# Patient Record
Sex: Female | Born: 1935 | Race: White | Hispanic: No | Smoking: Never smoker
Health system: Southern US, Community
[De-identification: ages and names within clinical notes are randomized; demographics above are authoritative.]

## PROBLEM LIST (undated history)

## (undated) DIAGNOSIS — I350 Nonrheumatic aortic (valve) stenosis: Secondary | ICD-10-CM

## (undated) DIAGNOSIS — E119 Type 2 diabetes mellitus without complications: Secondary | ICD-10-CM

## (undated) DIAGNOSIS — M109 Gout, unspecified: Secondary | ICD-10-CM

## (undated) DIAGNOSIS — E039 Hypothyroidism, unspecified: Secondary | ICD-10-CM

## (undated) DIAGNOSIS — D509 Iron deficiency anemia, unspecified: Secondary | ICD-10-CM

## (undated) DIAGNOSIS — G4730 Sleep apnea, unspecified: Secondary | ICD-10-CM

## (undated) DIAGNOSIS — N183 Chronic kidney disease, stage 3 unspecified: Secondary | ICD-10-CM

## (undated) DIAGNOSIS — F329 Major depressive disorder, single episode, unspecified: Secondary | ICD-10-CM

## (undated) DIAGNOSIS — E7800 Pure hypercholesterolemia, unspecified: Secondary | ICD-10-CM

## (undated) DIAGNOSIS — I10 Essential (primary) hypertension: Secondary | ICD-10-CM

## (undated) DIAGNOSIS — R55 Syncope and collapse: Secondary | ICD-10-CM

## (undated) DIAGNOSIS — F32A Depression, unspecified: Secondary | ICD-10-CM

## (undated) HISTORY — DX: Type 2 diabetes mellitus without complications: E11.9

## (undated) HISTORY — PX: THORACENTESIS: SHX235

## (undated) HISTORY — DX: Essential (primary) hypertension: I10

## (undated) HISTORY — DX: Iron deficiency anemia, unspecified: D50.9

## (undated) HISTORY — DX: Sleep apnea, unspecified: G47.30

## (undated) HISTORY — DX: Pure hypercholesterolemia, unspecified: E78.00

## (undated) HISTORY — PX: TONSILLECTOMY: SUR1361

---

## 1982-06-15 HISTORY — PX: BACK SURGERY: SHX140

## 1992-06-15 HISTORY — PX: KNEE SURGERY: SHX244

## 1995-06-16 HISTORY — PX: CATARACT EXTRACTION, BILATERAL: SHX1313

## 1997-06-15 HISTORY — PX: GASTRIC BYPASS: SHX52

## 1999-06-16 HISTORY — PX: CHOLECYSTECTOMY: SHX55

## 2014-05-14 LAB — PULMONARY FUNCTION TEST

## 2015-09-09 ENCOUNTER — Encounter: Payer: Self-pay | Admitting: Cardiology

## 2016-03-16 LAB — PULMONARY FUNCTION TEST

## 2016-05-12 ENCOUNTER — Encounter: Payer: Self-pay | Admitting: *Deleted

## 2016-05-13 ENCOUNTER — Ambulatory Visit (INDEPENDENT_AMBULATORY_CARE_PROVIDER_SITE_OTHER): Payer: Medicare Other | Admitting: Pulmonary Disease

## 2016-05-13 ENCOUNTER — Encounter: Payer: Self-pay | Admitting: Pulmonary Disease

## 2016-05-13 DIAGNOSIS — R0600 Dyspnea, unspecified: Secondary | ICD-10-CM | POA: Diagnosis not present

## 2016-05-13 DIAGNOSIS — R0689 Other abnormalities of breathing: Secondary | ICD-10-CM

## 2016-05-13 NOTE — Progress Notes (Addendum)
Madison Riggs    [id number]    04-Apr-1928  Primary Care Physician:HEPLER,MARK, PA-C  Referring Physician: Corine Shelter, PA-C [place], [place] [address]  Chief complaint:  Consult to establish care with new pulmonary practice.  HPI: Madison Riggs is an 82 year old with past medical history of hypertension, hypercholesterolemia, diabetes, anemia. She moved here from [place] after her husband passed away to be closer to her son. She was evaluated by pulmonologist Dr. Rudolpho Sevin at [facility] for pulmonary fibrosis. All data from [facility] reviewed. She had a high-resolution CT which did not show any pulmonary fibrosis however however she has small pleural effusion and a right-sided opacity consistent with atelectasis. She has history of right-sided pneumonia with located effusion status post thoracentesis around 2015. She is also noted to have desats at night after overnight oximetry and is on 2 L supplemental oxygen. Ambulatory oximetry in previous office on 03/16/16 showed transient desats to 85.   She has chronic dyspnea on exertion. She denies any cough, sputum production, wheezing. Her chief complaint today is swelling and edema around her abdomen and bilateral lower extremities. She has seen a cardiologist and nephrologist previously and is trying to establish that care here. She has seen Dr.[place] and has stage III CKD. She has been started on diuretics with improvement in swelling. She is waiting to see a cardiologist as well. She is a nonsmoker and used to work as a Primary school teacher at [employer]. She does not report any significant exposures at work or at home.   No outpatient encounter prescriptions on file as of 05/13/2016.   No facility-administered encounter medications on file as of 05/13/2016.     Allergies as of 05/13/2016  . (No Known Allergies)    Past Medical History:  Diagnosis Date  . DM (diabetes mellitus), type 2 ([place])   . HTN  (hypertension)   . Hypercholesterolemia   . Iron deficiency anemia     Past Surgical History:  Procedure Laterality Date  . BACK SURGERY  1984  . CHOLECYSTECTOMY  2001  . GASTRIC BYPASS  1999  . KNEE SURGERY Left 1994  . TONSILLECTOMY      Family History  Problem Relation Age of Onset  . Breast cancer Mother   . Colon cancer Father   . Prostate cancer Father   . Emphysema Father     Social History   Social History  . Marital status: Married    Spouse name: N/A  . Number of children: N/A  . Years of education: N/A   Occupational History  . Not on file.   Social History Main Topics  . Smoking status: Never Smoker  . Smokeless tobacco: Never Used  . Alcohol use No  . Drug use: No  . Sexual activity: Not on file   Other Topics Concern  . Not on file   Social History Narrative   Widowed   Lives with son and daughter-in-law   Madison Riggs here from [place] 03/25/2016   2 sons   Occupation: retired, was a Museum/gallery conservator with [employer]     Review of systems: Review of Systems  Constitutional: Negative for fever and chills.  HENT: Negative.   Eyes: Negative for blurred vision.  Respiratory: as per HPI  Cardiovascular: Negative for chest pain and palpitations.  Gastrointestinal: Negative for vomiting, diarrhea, blood per rectum. Genitourinary: Negative for dysuria, urgency, frequency and hematuria.  Musculoskeletal: Negative for myalgias, back pain  and joint pain.  Skin: Negative for itching and rash.  Neurological: Negative for dizziness, tremors, focal weakness, seizures and loss of consciousness.  Endo/Heme/Allergies: Negative for environmental allergies.  Psychiatric/Behavioral: Negative for depression, suicidal ideas and hallucinations.  All other systems reviewed and are negative.   Physical Exam: Blood pressure 116/60, pulse 78, height 5\' 2"  (1.575 m), weight 155 lb 3.2 oz (70.4 kg), SpO2 97 %. Gen:      No acute distress HEENT:  EOMI, sclera  anicteric Neck:     No masses; no thyromegaly Lungs:    Clear to auscultation bilaterally; normal respiratory effort CV:         Regular rate and rhythm; no murmurs Abd:      + bowel sounds; soft, non-tender; no palpable masses, no distension Ext:    No edema; adequate peripheral perfusion Skin:      Warm and dry; no rash Neuro: alert and oriented x 3 Psych: normal mood and affect  Data Reviewed: DATA collected from previous pulmonologist Chest x-ray 01/12/14- Right basilar airspace disease and moderate right effusion.  CT scan 02/06/14-moderate sized right pleural effusion, possibly located with associated compressive atelectasis of the right middle lobe and the right lower lobe  High resolution CT 03/19/16-small left pleural effusion. This is focal opacity and possibly mid-pleural thickening at the right posterior lung base with the focal opacity possibly reflecting chronic atelectasis or developing airspace disease. A follow-up CT chest in 4-6 months time recommended to ensure stability or resolution of the findings at the right posterior base. No additional high-resolution imaging to suggest air-trapping. There is no significant evidence to suggest bronchiectasis, subpleural bands, honeycombing septal thickening.   Chest x-ray 03/13/16- right pleural effusion unchanged. New blunting of left costophrenic angle consistent with pleural effusion. Cephalization of blood flow with indistinctiveness of interstitium. No mass or consolidation otherwise. Mild thoracic spondylosis. Findings consistent with CHF.  PFTs 05/14/14 FVC 1.79 (81%)  FEV1 1.52 [93%] F/F 85 TLC 62% DLCO 74%, DLCO/VA 160%. Mild-moderate restriction.  PFTs 03/16/16 FVC 1.51 (71%)  FEV1 1.17 (75%] F/F 77  DLCO 63, DLCO/VA 135% Mild-moderate restriction.   Echocardiogram 11/15/15 Normal LV size and function. EF-70% Normal RV size and function Mildly dilated LA Moderate aortic stenosis, mild mitral regurgitation, mild  tricuspid regurgitation. Normal PA pressures.  Assessment:  Consult for evaluation of pulmonary fibrosis. Review of her records including a high res CT last month is not suggestive of pulmonary fibrosis. She does have mild restriction may be due to body habitus. I think her main issue is fluid overload from CKD and possible heart issues. I'll contact the PCPs office to see if they have an echocardiogram on record.  She has a right lower lobe opacity which may be scarring from her previous pneumonia, loculated effusion. I'll reevaluate this with a repeat CT in 6 months. She'll continue on supplemental oxygen at night. She'll need an overnight oximetry and ambulatory oximetry at the time of next visit. Consider sleep study if unable to get off O2  Plan/Recommendations: - CT scan without contrast in 6 months  Marshell Garfinkel MD [facility] Pager [phone] 05/13/2016, 4:16 PM  CC: Corine Shelter, PA-C

## 2016-05-13 NOTE — Patient Instructions (Signed)
I do not see any evidence of pulmonary fibrosis in the records obtained from a previous pulmonologist. There is a small opacity on the right side which may be scarring from her previous pneumonia. We'll follow this up with a CT scan without contrast in 6 months. Follow-up after CT scan.

## 2016-06-02 DIAGNOSIS — R0689 Other abnormalities of breathing: Principal | ICD-10-CM

## 2016-06-02 DIAGNOSIS — R0600 Dyspnea, unspecified: Secondary | ICD-10-CM | POA: Insufficient documentation

## 2016-09-30 ENCOUNTER — Other Ambulatory Visit: Payer: Self-pay | Admitting: Gastroenterology

## 2016-09-30 DIAGNOSIS — R1310 Dysphagia, unspecified: Secondary | ICD-10-CM

## 2016-10-06 ENCOUNTER — Telehealth: Payer: Self-pay | Admitting: Hematology

## 2016-10-06 ENCOUNTER — Encounter: Payer: Self-pay | Admitting: Hematology

## 2016-10-06 NOTE — Telephone Encounter (Signed)
Received a call from [facility] from [employer] to schedule the pt an appt. Appt has been scheduled for the pt to see Dr. Irene Limbo on 5/21 at 11am. Tammi Klippel will notify the pt. Letter mailed to the pt.

## 2016-10-07 ENCOUNTER — Ambulatory Visit
Admission: RE | Admit: 2016-10-07 | Discharge: 2016-10-07 | Disposition: A | Discharge status: Home / Self Care | Payer: Medicare Other | Source: Ambulatory Visit | Attending: Gastroenterology | Admitting: Gastroenterology

## 2016-10-07 DIAGNOSIS — R1310 Dysphagia, unspecified: Secondary | ICD-10-CM

## 2016-10-11 ENCOUNTER — Emergency Department (HOSPITAL_BASED_OUTPATIENT_CLINIC_OR_DEPARTMENT_OTHER)
Admission: EM | Admit: 2016-10-11 | Discharge: 2016-10-11 | Disposition: A | Discharge status: Home / Self Care | Payer: Medicare Other | Attending: Emergency Medicine | Admitting: Emergency Medicine

## 2016-10-11 ENCOUNTER — Encounter (HOSPITAL_BASED_OUTPATIENT_CLINIC_OR_DEPARTMENT_OTHER): Payer: Self-pay | Admitting: Emergency Medicine

## 2016-10-11 ENCOUNTER — Emergency Department (HOSPITAL_BASED_OUTPATIENT_CLINIC_OR_DEPARTMENT_OTHER): Payer: Medicare Other

## 2016-10-11 DIAGNOSIS — S3992XA Unspecified injury of lower back, initial encounter: Secondary | ICD-10-CM | POA: Diagnosis present

## 2016-10-11 DIAGNOSIS — I10 Essential (primary) hypertension: Secondary | ICD-10-CM | POA: Diagnosis not present

## 2016-10-11 DIAGNOSIS — S300XXA Contusion of lower back and pelvis, initial encounter: Secondary | ICD-10-CM | POA: Insufficient documentation

## 2016-10-11 DIAGNOSIS — Y999 Unspecified external cause status: Secondary | ICD-10-CM | POA: Insufficient documentation

## 2016-10-11 DIAGNOSIS — E119 Type 2 diabetes mellitus without complications: Secondary | ICD-10-CM | POA: Diagnosis not present

## 2016-10-11 DIAGNOSIS — W010XXA Fall on same level from slipping, tripping and stumbling without subsequent striking against object, initial encounter: Secondary | ICD-10-CM | POA: Insufficient documentation

## 2016-10-11 DIAGNOSIS — Y92009 Unspecified place in unspecified non-institutional (private) residence as the place of occurrence of the external cause: Secondary | ICD-10-CM | POA: Insufficient documentation

## 2016-10-11 DIAGNOSIS — Z79899 Other long term (current) drug therapy: Secondary | ICD-10-CM | POA: Insufficient documentation

## 2016-10-11 DIAGNOSIS — Y939 Activity, unspecified: Secondary | ICD-10-CM | POA: Insufficient documentation

## 2016-10-11 MED ORDER — HYDROCODONE-ACETAMINOPHEN 5-325 MG PO TABS: 1.0000 | ORAL_TABLET | Freq: Four times a day (QID) | ORAL | 0 refills | Status: DC | PRN

## 2016-10-11 MED ORDER — HYDROCODONE-ACETAMINOPHEN 5-325 MG PO TABS
1.0000 | ORAL_TABLET | Freq: Once | ORAL | Status: AC
  Administered 2016-10-11: 1 via ORAL
  Filled 2016-10-11: qty 1

## 2016-10-11 NOTE — ED Triage Notes (Signed)
Reports fall at home-injuring right hip.  Reports she was seen at urgent care and xray was performed-negative results.  States she was referred to ER for CT.  Denies head injury, LOC.

## 2016-10-11 NOTE — ED Notes (Signed)
Pt teaching provided on medications that may cause drowsiness. Pt instructed not to drive or operate heavy machinery while taking the prescribed medication. Pt verbalized understanding.  Also discussed the risks of narcotics in the elderly.

## 2016-10-11 NOTE — ED Provider Notes (Signed)
[facility] DEPT [facility] Provider Note   CSN: [id number] Arrival date & time: 10/11/16  1725  By signing my name below, I, Neta Mends, attest that this documentation has been prepared under the direction and in the presence of Blanchie Dessert, MD . Electronically Signed: Neta Mends, ED Scribe. 10/11/2016. 6:18 PM.   History   Chief Complaint Chief Complaint  Patient presents with  . Fall   The history is provided by the patient. No language interpreter was used.   HPI Comments:  Madison Riggs is a 83 y.o. female who presents to the Emergency Department s/p fall that occurred PTA. Per family member, pt tripped on tile, and fell landing on her right hip. Pt complains of associated right hip pain at this time. She states that the pain is worse with walking. She is ambulatory with a walker. Pt was seen at urgent care today and the x-rays taken there were negative. She denies any head injury/LOC. Pt takes anticoagulates regularly. No alleviating factors noted. Pt denies other associated symptoms.   Past Medical History:  Diagnosis Date  . DM (diabetes mellitus), type 2 ([place])   . HTN (hypertension)   . Hypercholesterolemia   . Iron deficiency anemia     Patient Active Problem List   Diagnosis Date Noted  . Dyspnea and respiratory abnormalities 06/02/2016    Past Surgical History:  Procedure Laterality Date  . BACK SURGERY  1984  . CHOLECYSTECTOMY  2001  . GASTRIC BYPASS  1999  . KNEE SURGERY Left 1994  . TONSILLECTOMY      OB History    No data available       Home Medications    Prior to Admission medications   Medication Sig Start Date End Date Taking? Authorizing Provider  allopurinol (ZYLOPRIM) 100 MG tablet Take 100 mg by mouth daily.   Yes Historical Provider, MD  busPIRone (BUSPAR) 10 MG tablet Take 10 mg by mouth 3 (three) times daily.   Yes Historical Provider, MD  calcium-vitamin D (OSCAL WITH D) 500-200 MG-UNIT tablet Take 1 tablet by  mouth.   Yes Historical Provider, MD  cetirizine (ZYRTEC) 10 MG tablet Take 10 mg by mouth daily.   Yes Historical Provider, MD  Cinnamon 500 MG TABS Take by mouth.   Yes Historical Provider, MD  docusate sodium (COLACE) 100 MG capsule Take 100 mg by mouth 2 (two) times daily.   Yes Historical Provider, MD  folic acid (FOLVITE) 1 MG tablet Take 1 mg by mouth daily.   Yes Historical Provider, MD  glucosamine-chondroitin 500-400 MG tablet Take 1 tablet by mouth 3 (three) times daily.   Yes Historical Provider, MD  Lactobacillus (ACIDOPHILUS) 100 MG CAPS Take by mouth.   Yes Historical Provider, MD  levothyroxine (SYNTHROID, LEVOTHROID) 75 MCG tablet Take 75 mcg by mouth daily before breakfast.   Yes Historical Provider, MD  magnesium gluconate (MAGONATE) 500 MG tablet Take 500 mg by mouth 2 (two) times daily.   Yes Historical Provider, MD  polyethylene glycol (MIRALAX / GLYCOLAX) packet Take 17 g by mouth daily.   Yes Historical Provider, MD  simvastatin (ZOCOR) 20 MG tablet Take 20 mg by mouth daily.   Yes Historical Provider, MD  torsemide (DEMADEX) 20 MG tablet Take 20 mg by mouth daily.   Yes Historical Provider, MD  traZODone (DESYREL) 50 MG tablet Take 50 mg by mouth at bedtime.   Yes Historical Provider, MD  Turmeric 500 MG TABS Take by mouth.  Yes Historical Provider, MD  venlafaxine (EFFEXOR) 75 MG tablet Take 75 mg by mouth 2 (two) times daily.   Yes Historical Provider, MD    Family History Family History  Problem Relation Age of Onset  . Breast cancer Mother   . Colon cancer Father   . Prostate cancer Father   . Emphysema Father     Social History Social History  Substance Use Topics  . Smoking status: Never Smoker  . Smokeless tobacco: Never Used  . Alcohol use No     Allergies   Patient has no known allergies.   Review of Systems Review of Systems All systems reviewed and are negative for acute change except as noted in the HPI.   Physical Exam Updated Vital  Signs BP (!) 106/52   Pulse 88   Temp 98.1 F (36.7 C) (Oral)   Resp 16   Ht 5\' 2"  (1.575 m)   Wt 148 lb (67.1 kg)   SpO2 99%   BMI 27.07 kg/m   Physical Exam  Constitutional: She appears well-developed and well-nourished. No distress.  HENT:  Head: Normocephalic and atraumatic.  Eyes: Conjunctivae are normal.  Cardiovascular: Normal rate and regular rhythm.   Pulmonary/Chest: Effort normal.  Abdominal: Soft. She exhibits no distension. There is no tenderness.  Musculoskeletal:  Able to independently range bilateral lower extremities. No deformity or rotation. Sensation intact. Significant TTP of posterior hip and sacral notch. No lumbar tenderness.   Neurological: She is alert.  Skin: Skin is warm and dry.  Psychiatric: She has a normal mood and affect.  Nursing note and vitals reviewed.    ED Treatments / Results  DIAGNOSTIC STUDIES:  Oxygen Saturation is 99% on RA, normal by my interpretation.    COORDINATION OF CARE:  6:11 PM Discussed treatment plan with pt at bedside and pt agreed to plan.   Labs (all labs ordered are listed, but only abnormal results are displayed) Labs Reviewed - No data to display  EKG  EKG Interpretation None       Radiology Ct Hip Right Wo Contrast  Result Date: 10/11/2016 CLINICAL DATA:  Recent fall with right hip pain, initial encounter EXAM: CT OF THE RIGHT HIP WITHOUT CONTRAST TECHNIQUE: Multidetector CT imaging of the right hip was performed according to the standard protocol. Multiplanar CT image reconstructions were also generated. COMPARISON:  None. FINDINGS: Bones/Joint/Cartilage Mild degenerative changes of the lumbosacral junction as well as the sacroiliac joints are seen. There are mild degenerative changes of the right hip joint as well. No acute fracture or dislocation is seen. Ligaments Suboptimally assessed by CT. Muscles and Tendons A right gluteal hematoma is identified with a hematocrit level within. It measures  approximately 5 cm in greatest dimension. Soft tissues Changes of anasarca are noted. Ascites is noted within the abdomen incompletely evaluated by this exam. IMPRESSION: No acute hip fracture is identified. Degenerative changes are noted as described. Right gluteal hematoma. Ascites and changes of anasarca. Electronically Signed   By: Inez Catalina M.D.   On: 10/11/2016 19:07    Procedures Procedures (including critical care time)  Medications Ordered in ED Medications - No data to display   Initial Impression / Assessment and Plan / ED Course  I have reviewed the triage vital signs and the nursing notes.  Pertinent labs & imaging results that were available during my care of the patient were reviewed by me and considered in my medical decision making (see chart for details).  Patient is an elderly female presenting today after mechanical fall at her home. She fell on her right hip. She has been able to ambulate after the fall but has pain in her right buttocks. She was seen at urgent care and images were negative however she had recently had a barium swallow and there was barium obstructing the view of some of the images. She was sent here for evaluation to ensure no occult fracture. Patient is neurovascularly intact and pain is localized to the right buttocks. No evidence of trauma. No lumbar tenderness. CT shows no acute hip fracture but she does have her right gluteal hematoma. This is exactly where patient is having pain. Patient's pain was significantly improved after 1 Vicodin. She has family present and findings were relayed to the patient and her family. She lives with family and they can ensure her safety. She was discharged home to use ice and pain control  Final Clinical Impressions(s) / ED Diagnoses   Final diagnoses:  Traumatic hematoma of buttock, initial encounter    New Prescriptions New Prescriptions   HYDROCODONE-ACETAMINOPHEN (NORCO/VICODIN) 5-325 MG TABLET    Take 1  tablet by mouth every 6 (six) hours as needed for severe pain.   I personally performed the services described in this documentation, which was scribed in my presence.  The recorded information has been reviewed and considered.     Blanchie Dessert, MD 10/11/16 1954

## 2016-10-11 NOTE — ED Notes (Signed)
ED Provider at bedside. 

## 2016-10-16 ENCOUNTER — Observation Stay (HOSPITAL_BASED_OUTPATIENT_CLINIC_OR_DEPARTMENT_OTHER)
Admission: EM | Admit: 2016-10-16 | Discharge: 2016-10-19 | Disposition: A | Discharge status: Home / Self Care | Payer: Medicare Other | Attending: Family Medicine | Admitting: Family Medicine

## 2016-10-16 ENCOUNTER — Observation Stay (HOSPITAL_COMMUNITY): Payer: Medicare Other

## 2016-10-16 ENCOUNTER — Encounter (HOSPITAL_BASED_OUTPATIENT_CLINIC_OR_DEPARTMENT_OTHER): Payer: Self-pay | Admitting: *Deleted

## 2016-10-16 DIAGNOSIS — E1122 Type 2 diabetes mellitus with diabetic chronic kidney disease: Secondary | ICD-10-CM | POA: Insufficient documentation

## 2016-10-16 DIAGNOSIS — W19XXXD Unspecified fall, subsequent encounter: Secondary | ICD-10-CM | POA: Diagnosis not present

## 2016-10-16 DIAGNOSIS — F329 Major depressive disorder, single episode, unspecified: Secondary | ICD-10-CM | POA: Insufficient documentation

## 2016-10-16 DIAGNOSIS — D649 Anemia, unspecified: Secondary | ICD-10-CM

## 2016-10-16 DIAGNOSIS — M8580 Other specified disorders of bone density and structure, unspecified site: Secondary | ICD-10-CM | POA: Diagnosis not present

## 2016-10-16 DIAGNOSIS — Z9842 Cataract extraction status, left eye: Secondary | ICD-10-CM | POA: Diagnosis not present

## 2016-10-16 DIAGNOSIS — E1129 Type 2 diabetes mellitus with other diabetic kidney complication: Secondary | ICD-10-CM | POA: Diagnosis present

## 2016-10-16 DIAGNOSIS — I700 Atherosclerosis of aorta: Secondary | ICD-10-CM | POA: Diagnosis not present

## 2016-10-16 DIAGNOSIS — D508 Other iron deficiency anemias: Secondary | ICD-10-CM

## 2016-10-16 DIAGNOSIS — S300XXS Contusion of lower back and pelvis, sequela: Secondary | ICD-10-CM

## 2016-10-16 DIAGNOSIS — I2510 Atherosclerotic heart disease of native coronary artery without angina pectoris: Secondary | ICD-10-CM | POA: Diagnosis not present

## 2016-10-16 DIAGNOSIS — S300XXA Contusion of lower back and pelvis, initial encounter: Secondary | ICD-10-CM | POA: Insufficient documentation

## 2016-10-16 DIAGNOSIS — Z800 Family history of malignant neoplasm of digestive organs: Secondary | ICD-10-CM | POA: Insufficient documentation

## 2016-10-16 DIAGNOSIS — K210 Gastro-esophageal reflux disease with esophagitis: Secondary | ICD-10-CM | POA: Diagnosis not present

## 2016-10-16 DIAGNOSIS — D509 Iron deficiency anemia, unspecified: Secondary | ICD-10-CM | POA: Diagnosis not present

## 2016-10-16 DIAGNOSIS — R1310 Dysphagia, unspecified: Secondary | ICD-10-CM | POA: Diagnosis not present

## 2016-10-16 DIAGNOSIS — D62 Acute posthemorrhagic anemia: Secondary | ICD-10-CM | POA: Diagnosis not present

## 2016-10-16 DIAGNOSIS — Z803 Family history of malignant neoplasm of breast: Secondary | ICD-10-CM | POA: Insufficient documentation

## 2016-10-16 DIAGNOSIS — K5900 Constipation, unspecified: Secondary | ICD-10-CM | POA: Insufficient documentation

## 2016-10-16 DIAGNOSIS — Z9884 Bariatric surgery status: Secondary | ICD-10-CM | POA: Insufficient documentation

## 2016-10-16 DIAGNOSIS — Z9049 Acquired absence of other specified parts of digestive tract: Secondary | ICD-10-CM | POA: Insufficient documentation

## 2016-10-16 DIAGNOSIS — E7800 Pure hypercholesterolemia, unspecified: Secondary | ICD-10-CM | POA: Insufficient documentation

## 2016-10-16 DIAGNOSIS — Z79899 Other long term (current) drug therapy: Secondary | ICD-10-CM | POA: Insufficient documentation

## 2016-10-16 DIAGNOSIS — R188 Other ascites: Secondary | ICD-10-CM | POA: Insufficient documentation

## 2016-10-16 DIAGNOSIS — Z980 Intestinal bypass and anastomosis status: Secondary | ICD-10-CM | POA: Diagnosis not present

## 2016-10-16 DIAGNOSIS — R0600 Dyspnea, unspecified: Secondary | ICD-10-CM

## 2016-10-16 DIAGNOSIS — Z836 Family history of other diseases of the respiratory system: Secondary | ICD-10-CM | POA: Insufficient documentation

## 2016-10-16 DIAGNOSIS — M109 Gout, unspecified: Secondary | ICD-10-CM | POA: Insufficient documentation

## 2016-10-16 DIAGNOSIS — K922 Gastrointestinal hemorrhage, unspecified: Secondary | ICD-10-CM | POA: Diagnosis present

## 2016-10-16 DIAGNOSIS — N179 Acute kidney failure, unspecified: Secondary | ICD-10-CM | POA: Diagnosis not present

## 2016-10-16 DIAGNOSIS — N183 Chronic kidney disease, stage 3 (moderate): Secondary | ICD-10-CM | POA: Insufficient documentation

## 2016-10-16 DIAGNOSIS — I10 Essential (primary) hypertension: Secondary | ICD-10-CM | POA: Diagnosis present

## 2016-10-16 DIAGNOSIS — Z9841 Cataract extraction status, right eye: Secondary | ICD-10-CM | POA: Insufficient documentation

## 2016-10-16 DIAGNOSIS — E785 Hyperlipidemia, unspecified: Secondary | ICD-10-CM | POA: Insufficient documentation

## 2016-10-16 DIAGNOSIS — I130 Hypertensive heart and chronic kidney disease with heart failure and stage 1 through stage 4 chronic kidney disease, or unspecified chronic kidney disease: Secondary | ICD-10-CM | POA: Insufficient documentation

## 2016-10-16 DIAGNOSIS — E039 Hypothyroidism, unspecified: Secondary | ICD-10-CM | POA: Diagnosis not present

## 2016-10-16 DIAGNOSIS — R0602 Shortness of breath: Secondary | ICD-10-CM

## 2016-10-16 DIAGNOSIS — I080 Rheumatic disorders of both mitral and aortic valves: Secondary | ICD-10-CM | POA: Insufficient documentation

## 2016-10-16 DIAGNOSIS — E118 Type 2 diabetes mellitus with unspecified complications: Secondary | ICD-10-CM

## 2016-10-16 DIAGNOSIS — Z8042 Family history of malignant neoplasm of prostate: Secondary | ICD-10-CM | POA: Insufficient documentation

## 2016-10-16 DIAGNOSIS — I5033 Acute on chronic diastolic (congestive) heart failure: Secondary | ICD-10-CM | POA: Insufficient documentation

## 2016-10-16 HISTORY — DX: Nonrheumatic aortic (valve) stenosis: I35.0

## 2016-10-16 HISTORY — DX: Gout, unspecified: M10.9

## 2016-10-16 HISTORY — DX: Chronic kidney disease, stage 3 (moderate): N18.3

## 2016-10-16 HISTORY — DX: Major depressive disorder, single episode, unspecified: F32.9

## 2016-10-16 HISTORY — DX: Chronic kidney disease, stage 3 unspecified: N18.30

## 2016-10-16 HISTORY — DX: Depression, unspecified: F32.A

## 2016-10-16 HISTORY — DX: Hypothyroidism, unspecified: E03.9

## 2016-10-16 LAB — CBC WITH DIFFERENTIAL/PLATELET
Basophils Absolute: 0 10*3/uL (ref 0.0–0.1)
Basophils Relative: 0 %
EOS PCT: 4 %
Eosinophils Absolute: 0.3 10*3/uL (ref 0.0–0.7)
HEMATOCRIT: 24.8 % — AB (ref 36.0–46.0)
HEMOGLOBIN: 7.9 g/dL — AB (ref 12.0–15.0)
LYMPHS PCT: 18 %
Lymphs Abs: 1.1 10*3/uL (ref 0.7–4.0)
MCH: 28.9 pg (ref 26.0–34.0)
MCHC: 31.9 g/dL (ref 30.0–36.0)
MCV: 90.8 fL (ref 78.0–100.0)
Monocytes Absolute: 0.6 10*3/uL (ref 0.1–1.0)
Monocytes Relative: 10 %
Neutro Abs: 4.1 10*3/uL (ref 1.7–7.7)
Neutrophils Relative %: 68 %
PLATELETS: 194 10*3/uL (ref 150–400)
RBC: 2.73 MIL/uL — AB (ref 3.87–5.11)
RDW: 16.4 % — ABNORMAL HIGH (ref 11.5–15.5)
WBC: 6.1 10*3/uL (ref 4.0–10.5)

## 2016-10-16 LAB — COMPREHENSIVE METABOLIC PANEL
ALK PHOS: 88 U/L (ref 38–126)
ALT: 18 U/L (ref 14–54)
AST: 35 U/L (ref 15–41)
Albumin: 3.5 g/dL (ref 3.5–5.0)
Anion gap: 11 (ref 5–15)
BUN: 51 mg/dL — AB (ref 6–20)
CALCIUM: 8.5 mg/dL — AB (ref 8.9–10.3)
CHLORIDE: 102 mmol/L (ref 101–111)
CO2: 26 mmol/L (ref 22–32)
CREATININE: 1.62 mg/dL — AB (ref 0.44–1.00)
GFR calc Af Amer: 32 mL/min — ABNORMAL LOW (ref >60)
GFR, EST NON AFRICAN AMERICAN: 28 mL/min — AB (ref >60)
Glucose, Bld: 130 mg/dL — ABNORMAL HIGH (ref 65–99)
Potassium: 4.1 mmol/L (ref 3.5–5.1)
Sodium: 139 mmol/L (ref 135–145)
Total Bilirubin: 0.8 mg/dL (ref 0.3–1.2)
Total Protein: 7 g/dL (ref 6.5–8.1)

## 2016-10-16 LAB — OCCULT BLOOD X 1 CARD TO LAB, STOOL: Fecal Occult Bld: POSITIVE — AB

## 2016-10-16 MED ORDER — SODIUM CHLORIDE 0.9% FLUSH: 3.0000 mL | INTRAVENOUS | Status: DC | PRN

## 2016-10-16 MED ORDER — LORATADINE 10 MG PO TABS
10.0000 mg | ORAL_TABLET | Freq: Every day | ORAL | Status: DC
  Administered 2016-10-17 – 2016-10-19 (×3): 10 mg via ORAL
  Filled 2016-10-16 (×3): qty 1

## 2016-10-16 MED ORDER — SIMVASTATIN 20 MG PO TABS
20.0000 mg | ORAL_TABLET | Freq: Every day | ORAL | Status: DC
  Administered 2016-10-16 – 2016-10-18 (×3): 20 mg via ORAL
  Filled 2016-10-16 (×3): qty 1

## 2016-10-16 MED ORDER — ALLOPURINOL 100 MG PO TABS
100.0000 mg | ORAL_TABLET | Freq: Every day | ORAL | Status: DC
  Administered 2016-10-17 – 2016-10-19 (×3): 100 mg via ORAL
  Filled 2016-10-16 (×3): qty 1

## 2016-10-16 MED ORDER — LEVOTHYROXINE SODIUM 88 MCG PO TABS
88.0000 ug | ORAL_TABLET | Freq: Every day | ORAL | Status: DC
  Administered 2016-10-17 – 2016-10-19 (×3): 88 ug via ORAL
  Filled 2016-10-16 (×3): qty 1

## 2016-10-16 MED ORDER — VENLAFAXINE HCL 75 MG PO TABS
75.0000 mg | ORAL_TABLET | Freq: Two times a day (BID) | ORAL | Status: DC
  Administered 2016-10-16 – 2016-10-19 (×6): 75 mg via ORAL
  Filled 2016-10-16 (×6): qty 1

## 2016-10-16 MED ORDER — ACETAMINOPHEN 650 MG RE SUPP: 650.0000 mg | Freq: Four times a day (QID) | RECTAL | Status: DC | PRN

## 2016-10-16 MED ORDER — SODIUM CHLORIDE 0.9 % IV SOLN
INTRAVENOUS | Status: DC
  Administered 2016-10-16: 22:00:00 via INTRAVENOUS

## 2016-10-16 MED ORDER — MAGNESIUM GLUCONATE 500 MG PO TABS
500.0000 mg | ORAL_TABLET | Freq: Two times a day (BID) | ORAL | Status: DC
  Administered 2016-10-16 – 2016-10-19 (×6): 500 mg via ORAL
  Filled 2016-10-16 (×6): qty 1

## 2016-10-16 MED ORDER — GLUCOSAMINE-CHONDROITIN 500-400 MG PO TABS: 1.0000 | ORAL_TABLET | Freq: Three times a day (TID) | ORAL | Status: DC

## 2016-10-16 MED ORDER — HYDROCODONE-ACETAMINOPHEN 5-325 MG PO TABS
1.0000 | ORAL_TABLET | Freq: Four times a day (QID) | ORAL | Status: DC | PRN
  Administered 2016-10-16 – 2016-10-18 (×3): 1 via ORAL
  Filled 2016-10-16 (×3): qty 1

## 2016-10-16 MED ORDER — SODIUM CHLORIDE 0.9% FLUSH
3.0000 mL | Freq: Two times a day (BID) | INTRAVENOUS | Status: DC
  Administered 2016-10-16 – 2016-10-18 (×4): 3 mL via INTRAVENOUS

## 2016-10-16 MED ORDER — POLYETHYLENE GLYCOL 3350 17 G PO PACK
17.0000 g | PACK | Freq: Every day | ORAL | Status: DC
  Administered 2016-10-17 – 2016-10-18 (×2): 17 g via ORAL
  Filled 2016-10-16 (×3): qty 1

## 2016-10-16 MED ORDER — FOLIC ACID 1 MG PO TABS
1.0000 mg | ORAL_TABLET | Freq: Every day | ORAL | Status: DC
  Administered 2016-10-17 – 2016-10-19 (×3): 1 mg via ORAL
  Filled 2016-10-16 (×3): qty 1

## 2016-10-16 MED ORDER — SODIUM CHLORIDE 0.9% FLUSH
3.0000 mL | Freq: Two times a day (BID) | INTRAVENOUS | Status: DC
  Administered 2016-10-17 (×2): 3 mL via INTRAVENOUS

## 2016-10-16 MED ORDER — ACETAMINOPHEN 325 MG PO TABS
650.0000 mg | ORAL_TABLET | Freq: Four times a day (QID) | ORAL | Status: DC | PRN
  Administered 2016-10-18 – 2016-10-19 (×2): 650 mg via ORAL
  Filled 2016-10-16 (×2): qty 2

## 2016-10-16 MED ORDER — SODIUM CHLORIDE 0.9 % IV SOLN
250.0000 mL | INTRAVENOUS | Status: DC | PRN
Start: 2016-10-16 — End: 2016-10-19

## 2016-10-16 MED ORDER — TRAZODONE HCL 50 MG PO TABS
50.0000 mg | ORAL_TABLET | Freq: Every day | ORAL | Status: DC
  Administered 2016-10-16 – 2016-10-18 (×3): 50 mg via ORAL
  Filled 2016-10-16 (×3): qty 1

## 2016-10-16 MED ORDER — DOCUSATE SODIUM 100 MG PO CAPS
100.0000 mg | ORAL_CAPSULE | Freq: Two times a day (BID) | ORAL | Status: DC
  Administered 2016-10-16 – 2016-10-18 (×5): 100 mg via ORAL
  Filled 2016-10-16 (×5): qty 1

## 2016-10-16 NOTE — Progress Notes (Signed)
PHARMACIST - PHYSICIAN ORDER COMMUNICATION  CONCERNING: P&T Medication Policy on Herbal Medications  DESCRIPTION:  This patient's order for:  Glucosamine/chondroitin  has been noted.  This product(s) is classified as an "herbal" or natural product. Due to a lack of definitive safety studies or FDA approval, nonstandard manufacturing practices, plus the potential risk of unknown drug-drug interactions while on inpatient medications, the Pharmacy and Therapeutics Committee does not permit the use of "herbal" or natural products of this type within [facility].   ACTION TAKEN: The pharmacy department is unable to verify this order at this time and your patient has been informed of this safety policy. Please reevaluate patient's clinical condition at discharge and address if the herbal or natural product(s) should be resumed at that time.  Doreene Eland, PharmD, BCPS.   10/16/2016 10:02 PM

## 2016-10-16 NOTE — H&P (Addendum)
TRH H&P   Patient Demographics:    Madison Riggs, is a 83 y.o. female  MRN: [id number]   DOB - 1928-05-20  Admit Date - 10/16/2016  Outpatient Primary MD for the patient is Corine Shelter, PA-C  Referring MD/NP/PA:  Davonna Belling  Outpatient Specialists: Raymondo Band (nephrology), Dr. Jannifer Franklin (ENT in [place]), Dr. Irene Limbo (heme-onc) , Praveen Mannam (pulmonary), Arta Silence (GI)  Patient coming from: home  Chief Complaint  Patient presents with  . Anemia      HPI:    Madison Riggs  is a 83 y.o. female, w AS (moderate), MR (mild), CKD stage3, Hypertension, Hyperlipidemia, Anemia, apparently was called by Sadie Haber at [facility] who told pt to go to ER for evaluation of anemia. Hgb=7.7   Pt was told iron was severely deficient, and had mild Acute on CRF.   Pt states she has been taking iron for a while, and more recently been taking liquid iron, but apparently this made her constipated.  Pt has hematoma on the right buttock. This resulted from fall on 10/11/2016.  Pt had CT scan hip at that time showed 5cm hematoma   Pt presented to ED for evaluation.   In ED, pt was noted to be anemic hgb 7.9,  Creatinine 1.62. Hemeoccult positive.  ED requested admission for dyspnea, anemia, renal insufficiency.     Review of systems:    In addition to the HPI above, No Fever-chills,   No Headache, No changes with Vision or hearing, No problems swallowing food or Liquids, No Chest pain, Cough or Shortness of Breath, No Abdominal pain, No Nausea or Vommitting, No Blood in  Urine, No dysuria, No new skin rashes or bruises, No new joints pains-aches,  No new weakness, tingling, numbness in any extremity, No recent weight gain or loss, No polyuria, polydypsia or polyphagia, No significant Mental Stressors.  A full 10 point Review of Systems was done, except as stated above, all other  Review of Systems were negative.   With Past History of the following :    Past Medical History:  Diagnosis Date  . Aortic stenosis    moderate  . CKD (chronic kidney disease) stage 3, GFR 30-59 ml/min   . DM (diabetes mellitus), type 2 ([place])   . HTN (hypertension)   . Hypercholesterolemia   . Iron deficiency anemia   . Mitral regurgitation    Mild      Past Surgical History:  Procedure Laterality Date  . BACK SURGERY  1984  . CHOLECYSTECTOMY  2001  . GASTRIC BYPASS  1999  . KNEE SURGERY Left 1994  . TONSILLECTOMY        Social History:     Social History  Substance Use Topics  . Smoking status: Never Smoker  . Smokeless tobacco: Never Used  . Alcohol use No     Lives - at home w family  Mobility - able to walk by self without walker.   Family History :     Family History  Problem Relation Age of Onset  . Breast cancer Mother   . Colon cancer Father   . Prostate cancer Father   . Emphysema Father       Home Medications:   Prior to Admission medications   Medication Sig Start Date End Date Taking? Authorizing Provider  allopurinol (ZYLOPRIM) 100 MG tablet Take 100 mg by mouth daily.    [provider]  busPIRone (BUSPAR) 10 MG tablet Take 10 mg by mouth 3 (three) times daily.    [provider]  calcium-vitamin D (OSCAL WITH D) 500-200 MG-UNIT tablet Take 1 tablet by mouth.    [provider]  cetirizine (ZYRTEC) 10 MG tablet Take 10 mg by mouth daily.    [provider]  Cinnamon 500 MG TABS Take by mouth.    [provider]  docusate sodium (COLACE) 100 MG capsule Take 100 mg by mouth 2 (two) times daily.    [provider]  folic acid (FOLVITE) 1 MG tablet Take 1 mg by mouth daily.    [provider]  glucosamine-chondroitin 500-400 MG tablet Take 1 tablet by mouth 3 (three) times daily.    [provider]  HYDROcodone-acetaminophen (NORCO/VICODIN) 5-325 MG tablet Take 1 tablet  by mouth every 6 (six) hours as needed for severe pain. 10/11/16   Blanchie Dessert, MD  Lactobacillus (ACIDOPHILUS) 100 MG CAPS Take by mouth.    [provider]  levothyroxine (SYNTHROID, LEVOTHROID) 75 MCG tablet Take 75 mcg by mouth daily before breakfast.    [provider]  magnesium gluconate (MAGONATE) 500 MG tablet Take 500 mg by mouth 2 (two) times daily.    [provider]  polyethylene glycol (MIRALAX / GLYCOLAX) packet Take 17 g by mouth daily.    [provider]  simvastatin (ZOCOR) 20 MG tablet Take 20 mg by mouth daily.    [provider]  torsemide (DEMADEX) 20 MG tablet Take 20 mg by mouth daily.    [provider]  traZODone (DESYREL) 50 MG tablet Take 50 mg by mouth at bedtime.    [provider]  Turmeric 500 MG TABS Take by mouth.    [provider]  venlafaxine (EFFEXOR) 75 MG tablet Take 75 mg by mouth 2 (two) times daily.    [provider]     Allergies:    No Known Allergies   Physical Exam:   Vitals  Blood pressure 113/60, pulse 93, temperature 98.6 F (37 C), temperature source Oral, resp. rate 16, height 5\' 2"  (1.575 m), weight 71 kg (156 lb 8.4 oz), SpO2 100 %.   1. General  lying in bed in NAD,    2. Normal affect and insight, Not Suicidal or Homicidal, Awake Alert, Oriented X 3.  3. No F.N deficits, ALL C.Nerves Intact, Strength 5/5 all 4 extremities, Sensation intact all 4 extremities, Plantars down going.  4. Ears and Eyes appear Normal, Conjunctivae clear, PERRLA. Moist Oral Mucosa.  5. Supple Neck, No JVD, No cervical lymphadenopathy appriciated, No Carotid Bruits.  6. Symmetrical Chest wall movement, Good air movement bilaterally, slight crackles at bilateral base right > left  7. RRR, S1, S2, 2/6 sem rusb  8. Positive Bowel Sounds, Abdomen Soft, No tenderness, No organomegaly appriciated,No rebound -guarding or rigidity.  9.  No Cyanosis, Normal Skin Turgor,  No Skin Rash or Bruise.  10. Good muscle tone,  joints appear normal , no effusions, Normal ROM.  11. No Palpable Lymph Nodes in Neck or Axillae     Data Review:    CBC  Recent Labs Lab 10/16/16 1640  WBC 6.1  HGB 7.9*  HCT 24.8*  PLT 194  MCV 90.8  MCH 28.9  MCHC 31.9  RDW 16.4*  LYMPHSABS 1.1  MONOABS 0.6  EOSABS 0.3  BASOSABS 0.0   ------------------------------------------------------------------------------------------------------------------  Chemistries   Recent Labs Lab 10/16/16 1640  NA 139  K 4.1  CL 102  CO2 26  GLUCOSE 130*  BUN 51*  CREATININE 1.62*  CALCIUM 8.5*  AST 35  ALT 18  ALKPHOS 88  BILITOT 0.8   ------------------------------------------------------------------------------------------------------------------ estimated creatinine clearance is 23 mL/min (A) (by C-G formula based on SCr of 1.62 mg/dL (H)). ------------------------------------------------------------------------------------------------------------------ No results for input(s): TSH, T4TOTAL, T3FREE, THYROIDAB in the last 72 hours.  Invalid input(s): FREET3  Coagulation profile No results for input(s): INR, PROTIME in the last 168 hours. ------------------------------------------------------------------------------------------------------------------- No results for input(s): DDIMER in the last 72 hours. -------------------------------------------------------------------------------------------------------------------  Cardiac Enzymes No results for input(s): CKMB, TROPONINI, MYOGLOBIN in the last 168 hours.  Invalid input(s): CK ------------------------------------------------------------------------------------------------------------------ No results found for: BNP   ---------------------------------------------------------------------------------------------------------------  Urinalysis No results found for: COLORURINE, APPEARANCEUR, LABSPEC, [place],  GLUCOSEU, HGBUR, BILIRUBINUR, KETONESUR, PROTEINUR, UROBILINOGEN, NITRITE, LEUKOCYTESUR  ----------------------------------------------------------------------------------------------------------------   Imaging Results:    No results found.   Assessment & Plan:    Principal Problem:   Anemia Active Problems:   Acute renal failure (ARF) (HCC)   Hypertension   Type II diabetes mellitus with manifestations (HCC)    Anemia , iron deficiency  ? Secondary to gastric bypass Check ferritin, iron, tibc, b12, folate, celiac panel Consider spep, upep as outpatient, my guess is that this has probably been done by nephrology If iron deficient consider injectafer iv iron   Heme positive stool Please try to get prior colonoscopy reports from [facility]  ?  Dyspnea CXR pa and lateral Consider repeat CT scan noncontrast chest while here in hospital as per pulmonary recommendations Check cardiac echo  CKD stage 3 Check cmp in am  Dm2 fsbs ac and qhs, iss Check hga1c  Hypothyroidism Cont levothyroxine   Depression Stop buspirone , slight duplication of therapy (realized this was used to augment, but maybe she can do without it) Cont Effexor, cont Trazadone  Gout Cont allopurinol  Hyperlipidemia Cont simvastatin  DVT Prophylaxis Heparin - SCDs  AM Labs Ordered, also please review Full Orders  Family Communication: Admission, patients condition and plan of care including tests being ordered have been discussed with the patient who indicate understanding and agree with the plan and Code Status.  Code Status  FULL CODE  Likely DC to home  Condition GUARDED    Consults called: none   Admission status: observation   Time spent in minutes : 45 minutes   Jani Gravel M.D on 10/16/2016 at 9:46 PM  Between 7am to 7pm - Pager - [phone]     After 7pm go to [email or web address] - password [facility]  [facility] - Office  [phone]

## 2016-10-16 NOTE — Progress Notes (Signed)
Per Dr. Alvino Chapel, pt neeeds admission due to Anemia, and Buttock Hematoma , possible mild ARF

## 2016-10-16 NOTE — ED Notes (Signed)
Dr. Alvino Chapel at bedside for rectal exam with RN Jumanah Hynson as chaperone

## 2016-10-16 NOTE — ED Provider Notes (Addendum)
[facility] Provider Note   CSN: [id number] Arrival date & time: 10/16/16  1610     History   Chief Complaint Chief Complaint  Patient presents with  . Anemia    HPI Madison Riggs is a 83 y.o. female.  HPI Patient presents with reported anemia. Sent in from primary care doctor for hemoglobin of reportedly 7. Unsure of baseline but thinks it may be 12. Also history of iron deficiency. Previous history gastric bypass. Was here around 6 days ago with a fall and had hematoma in her buttock. No blood in her stool or black stool. Has had fatigue and some shortness of breath but that predates the fall. She is not on anticoagulation. No chest pain. No trouble breathing.   Past Medical History:  Diagnosis Date  . DM (diabetes mellitus), type 2 ([place])   . HTN (hypertension)   . Hypercholesterolemia   . Iron deficiency anemia     Patient Active Problem List   Diagnosis Date Noted  . Dyspnea and respiratory abnormalities 06/02/2016    Past Surgical History:  Procedure Laterality Date  . BACK SURGERY  1984  . CHOLECYSTECTOMY  2001  . GASTRIC BYPASS  1999  . KNEE SURGERY Left 1994  . TONSILLECTOMY      OB History    No data available       Home Medications    Prior to Admission medications   Medication Sig Start Date End Date Taking? Authorizing Provider  allopurinol (ZYLOPRIM) 100 MG tablet Take 100 mg by mouth daily.    Historical Provider, MD  busPIRone (BUSPAR) 10 MG tablet Take 10 mg by mouth 3 (three) times daily.    Historical Provider, MD  calcium-vitamin D (OSCAL WITH D) 500-200 MG-UNIT tablet Take 1 tablet by mouth.    Historical Provider, MD  cetirizine (ZYRTEC) 10 MG tablet Take 10 mg by mouth daily.    Historical Provider, MD  Cinnamon 500 MG TABS Take by mouth.    Historical Provider, MD  docusate sodium (COLACE) 100 MG capsule Take 100 mg by mouth 2 (two) times daily.    Historical Provider, MD  folic acid (FOLVITE) 1 MG tablet Take 1 mg by  mouth daily.    Historical Provider, MD  glucosamine-chondroitin 500-400 MG tablet Take 1 tablet by mouth 3 (three) times daily.    Historical Provider, MD  HYDROcodone-acetaminophen (NORCO/VICODIN) 5-325 MG tablet Take 1 tablet by mouth every 6 (six) hours as needed for severe pain. 10/11/16   Blanchie Dessert, MD  Lactobacillus (ACIDOPHILUS) 100 MG CAPS Take by mouth.    Historical Provider, MD  levothyroxine (SYNTHROID, LEVOTHROID) 75 MCG tablet Take 75 mcg by mouth daily before breakfast.    Historical Provider, MD  magnesium gluconate (MAGONATE) 500 MG tablet Take 500 mg by mouth 2 (two) times daily.    Historical Provider, MD  polyethylene glycol (MIRALAX / GLYCOLAX) packet Take 17 g by mouth daily.    Historical Provider, MD  simvastatin (ZOCOR) 20 MG tablet Take 20 mg by mouth daily.    Historical Provider, MD  torsemide (DEMADEX) 20 MG tablet Take 20 mg by mouth daily.    Historical Provider, MD  traZODone (DESYREL) 50 MG tablet Take 50 mg by mouth at bedtime.    Historical Provider, MD  Turmeric 500 MG TABS Take by mouth.    Historical Provider, MD  venlafaxine (EFFEXOR) 75 MG tablet Take 75 mg by mouth 2 (two) times daily.    Historical Provider,  MD    Family History Family History  Problem Relation Age of Onset  . Breast cancer Mother   . Colon cancer Father   . Prostate cancer Father   . Emphysema Father     Social History Social History  Substance Use Topics  . Smoking status: Never Smoker  . Smokeless tobacco: Never Used  . Alcohol use No     Allergies   Patient has no known allergies.   Rectal exam showed brown stool no gross blood. Review of Systems Review of Systems  Constitutional: Positive for fatigue. Negative for appetite change.  HENT: Negative for congestion.   Respiratory: Positive for shortness of breath.   Cardiovascular: Negative for chest pain.  Gastrointestinal: Negative for abdominal pain and blood in stool.  Genitourinary: Negative for dysuria  and flank pain.  Musculoskeletal: Negative for back pain.       Right buttock pain  Skin: Negative for rash.  Neurological: Negative for light-headedness and headaches.  Hematological: Bruises/bleeds easily.  Psychiatric/Behavioral: Negative for confusion.     Physical Exam Updated Vital Signs BP (!) 109/58   Pulse 85   Temp 98.7 F (37.1 C) (Oral)   Resp 16   Ht 5\' 2"  (1.575 m)   Wt 148 lb (67.1 kg)   SpO2 96%   BMI 27.07 kg/m   Physical Exam  Constitutional: She appears well-developed.  HENT:  Head: Atraumatic.  Neck: Neck supple.  Cardiovascular: Normal rate.   Pulmonary/Chest: Effort normal.  Abdominal: Soft.  Musculoskeletal: She exhibits tenderness.  Some tenderness and swelling in right buttock area. Some ecchymosis particularly medially.  Neurological: She is alert.  Skin: Skin is warm. Capillary refill takes less than 2 seconds.  Psychiatric: She has a normal mood and affect.   Rectal exam shows brown stool no gross blood.  ED Treatments / Results  Labs (all labs ordered are listed, but only abnormal results are displayed) Labs Reviewed  CBC WITH DIFFERENTIAL/PLATELET - Abnormal; Notable for the following:       Result Value   RBC 2.73 (*)    Hemoglobin 7.9 (*)    HCT 24.8 (*)    RDW 16.4 (*)    All other components within normal limits  COMPREHENSIVE METABOLIC PANEL - Abnormal; Notable for the following:    Glucose, Bld 130 (*)    BUN 51 (*)    Creatinine, Ser 1.62 (*)    Calcium 8.5 (*)    GFR calc non Af Amer 28 (*)    GFR calc Af Amer 32 (*)    All other components within normal limits  OCCULT BLOOD X 1 CARD TO LAB, STOOL    EKG  EKG Interpretation None       Radiology No results found.  Procedures Procedures (including critical care time)  Medications Ordered in ED Medications - No data to display   Initial Impression / Assessment and Plan / ED Course  I have reviewed the triage vital signs and the nursing  notes.  Pertinent labs & imaging results that were available during my care of the patient were reviewed by me and considered in my medical decision making (see chart for details).     Patient sent in for anemia. Unsure of baseline. Likely came from hematoma and right gluteus. Has reported history of iron deficiency is supposed to see hematology however she is not microcytic this time. Mild hypotension but she had this with her last ER visit also. Hemoglobin is 7.9. BUN and creatinine  mildly increased to. Unknown baseline. [facility] for further evaluation treatment.  Final Clinical Impressions(s) / ED Diagnoses   Final diagnoses:  Anemia, unspecified type    New Prescriptions New Prescriptions   No medications on file     Davonna Belling, MD 10/16/16 1811  Patient was also guaiac positive. Reportedly has a fluid restriction by PCP. Admit to Dr. Maudie Mercury at [facility].    Davonna Belling, MD 10/16/16 2007

## 2016-10-16 NOTE — ED Triage Notes (Signed)
She received a call from her MD today informing her of a low HGB. She was told to go to the ED.

## 2016-10-16 NOTE — ED Notes (Signed)
Daughter in law took pt's rings

## 2016-10-17 ENCOUNTER — Observation Stay (HOSPITAL_BASED_OUTPATIENT_CLINIC_OR_DEPARTMENT_OTHER): Payer: Medicare Other

## 2016-10-17 ENCOUNTER — Observation Stay (HOSPITAL_COMMUNITY): Payer: Medicare Other

## 2016-10-17 DIAGNOSIS — S300XXS Contusion of lower back and pelvis, sequela: Secondary | ICD-10-CM | POA: Diagnosis not present

## 2016-10-17 DIAGNOSIS — I10 Essential (primary) hypertension: Secondary | ICD-10-CM | POA: Diagnosis not present

## 2016-10-17 DIAGNOSIS — D649 Anemia, unspecified: Secondary | ICD-10-CM | POA: Diagnosis not present

## 2016-10-17 DIAGNOSIS — N183 Chronic kidney disease, stage 3 (moderate): Secondary | ICD-10-CM

## 2016-10-17 DIAGNOSIS — I5033 Acute on chronic diastolic (congestive) heart failure: Secondary | ICD-10-CM

## 2016-10-17 DIAGNOSIS — Z794 Long term (current) use of insulin: Secondary | ICD-10-CM

## 2016-10-17 DIAGNOSIS — E118 Type 2 diabetes mellitus with unspecified complications: Secondary | ICD-10-CM | POA: Diagnosis not present

## 2016-10-17 DIAGNOSIS — I361 Nonrheumatic tricuspid (valve) insufficiency: Secondary | ICD-10-CM

## 2016-10-17 DIAGNOSIS — D508 Other iron deficiency anemias: Secondary | ICD-10-CM | POA: Diagnosis not present

## 2016-10-17 LAB — CBC
HEMATOCRIT: 22.9 % — AB (ref 36.0–46.0)
HEMOGLOBIN: 7.4 g/dL — AB (ref 12.0–15.0)
MCH: 29 pg (ref 26.0–34.0)
MCHC: 32.3 g/dL (ref 30.0–36.0)
MCV: 89.8 fL (ref 78.0–100.0)
Platelets: 194 10*3/uL (ref 150–400)
RBC: 2.55 MIL/uL — ABNORMAL LOW (ref 3.87–5.11)
RDW: 16.8 % — ABNORMAL HIGH (ref 11.5–15.5)
WBC: 5.3 10*3/uL (ref 4.0–10.5)

## 2016-10-17 LAB — COMPREHENSIVE METABOLIC PANEL
ALBUMIN: 3.1 g/dL — AB (ref 3.5–5.0)
ALT: 15 U/L (ref 14–54)
ANION GAP: 8 (ref 5–15)
AST: 28 U/L (ref 15–41)
Alkaline Phosphatase: 72 U/L (ref 38–126)
BUN: 45 mg/dL — ABNORMAL HIGH (ref 6–20)
CALCIUM: 8.5 mg/dL — AB (ref 8.9–10.3)
CHLORIDE: 104 mmol/L (ref 101–111)
CO2: 29 mmol/L (ref 22–32)
Creatinine, Ser: 1.37 mg/dL — ABNORMAL HIGH (ref 0.44–1.00)
GFR calc non Af Amer: 34 mL/min — ABNORMAL LOW (ref >60)
GFR, EST AFRICAN AMERICAN: 39 mL/min — AB (ref >60)
GLUCOSE: 112 mg/dL — AB (ref 65–99)
Potassium: 3.6 mmol/L (ref 3.5–5.1)
SODIUM: 141 mmol/L (ref 135–145)
Total Bilirubin: 0.7 mg/dL (ref 0.3–1.2)
Total Protein: 6.2 g/dL — ABNORMAL LOW (ref 6.5–8.1)

## 2016-10-17 LAB — IRON AND TIBC
Iron: 24 ug/dL — ABNORMAL LOW (ref 28–170)
SATURATION RATIOS: 6 % — AB (ref 10.4–31.8)
TIBC: 413 ug/dL (ref 250–450)
UIBC: 389 ug/dL

## 2016-10-17 LAB — VITAMIN B12: Vitamin B-12: 292 pg/mL (ref 180–914)

## 2016-10-17 LAB — ECHOCARDIOGRAM COMPLETE
Height: 62 in
WEIGHTICAEL: 2507.95 [oz_av]

## 2016-10-17 LAB — FERRITIN: FERRITIN: 23 ng/mL (ref 11–307)

## 2016-10-17 LAB — ABO/RH: ABO/RH(D): B POS

## 2016-10-17 LAB — PREPARE RBC (CROSSMATCH)

## 2016-10-17 MED ORDER — SODIUM CHLORIDE 0.9 % IV SOLN
500.0000 mg | Freq: Every day | INTRAVENOUS | Status: DC
  Filled 2016-10-17: qty 10

## 2016-10-17 MED ORDER — SODIUM CHLORIDE 0.9 % IV SOLN
Freq: Once | INTRAVENOUS | Status: AC
  Administered 2016-10-17: 16:00:00 via INTRAVENOUS

## 2016-10-17 MED ORDER — FUROSEMIDE 10 MG/ML IJ SOLN
20.0000 mg | Freq: Two times a day (BID) | INTRAMUSCULAR | Status: AC
  Administered 2016-10-17 (×2): 20 mg via INTRAVENOUS
  Filled 2016-10-17 (×2): qty 2

## 2016-10-17 MED ORDER — TORSEMIDE 20 MG PO TABS
20.0000 mg | ORAL_TABLET | Freq: Two times a day (BID) | ORAL | Status: DC
  Administered 2016-10-18: 20 mg via ORAL
  Filled 2016-10-17 (×2): qty 1

## 2016-10-17 MED ORDER — SODIUM CHLORIDE 0.9 % IV SOLN
510.0000 mg | Freq: Once | INTRAVENOUS | Status: AC
  Administered 2016-10-18: 510 mg via INTRAVENOUS
  Filled 2016-10-17: qty 17

## 2016-10-17 MED ORDER — SODIUM CHLORIDE 0.9 % IV SOLN
500.0000 mg | Freq: Once | INTRAVENOUS | Status: AC
  Administered 2016-10-17: 500 mg via INTRAVENOUS
  Filled 2016-10-17: qty 10

## 2016-10-17 MED ORDER — SODIUM CHLORIDE 0.9 % IV SOLN
25.0000 mg | Freq: Once | INTRAVENOUS | Status: AC
  Administered 2016-10-17: 25 mg via INTRAVENOUS
  Filled 2016-10-17: qty 0.5

## 2016-10-17 MED ORDER — HYDROXYZINE HCL 25 MG PO TABS
25.0000 mg | ORAL_TABLET | Freq: Four times a day (QID) | ORAL | Status: DC | PRN
  Filled 2016-10-17: qty 1

## 2016-10-17 MED ORDER — SODIUM CHLORIDE 0.9 % IV SOLN: 500.0000 mg | Freq: Once | INTRAVENOUS | Status: DC

## 2016-10-17 NOTE — Care Management Note (Signed)
[place] NOTIFICATION   Patient Details  Name: JAZALYN MONDOR MRN: [id number] Date of Birth: 10/12/1927   Medicare Observation Status Notification Given:   yes    Norina Buzzard, RN 10/17/2016, 12:55 PM

## 2016-10-17 NOTE — Progress Notes (Signed)
  Echocardiogram 2D Echocardiogram has been performed.  Darlina Sicilian M 10/17/2016, 1:44 PM

## 2016-10-17 NOTE — Progress Notes (Signed)
PROGRESS NOTE [facility]   Madison Riggs   LOV:[id number] DOB: 21-Jan-1928  DOA: 10/16/2016 PCP: Corine Shelter, PA-C   Brief Narrative:  83 year old female with past medical history of chronic kidney disease stage III, hypertension, hyperlipidemia and anemia who was sent by PCP due to anemia. Patient was elevated in the ED and found to her hemoglobin was 7.9 with Hemoccult positive and was admitted for GI evaluation.  Subjective: Patient seen and examined this morning she report some dyspnea and weakness when ambulating. Also feeling dizziness when she stands.  Assessment & Plan: Symptomatic anemia, acute on chronic blood loss. Iron deficiency. Suspect GI bleed We'll transfuse 1 unit given symptoms GI consulted planning for endoscopy in the morning. Check CBC in the morning We'll give IV Iron x 3 doses   Acute on Chronic diastolic HF - CXR concern for pulm edema, on exam crackles at the base  ECHO normal EF, G2DD Will give 1 dose of lasix now and one dose after transfusion  Will resume torsemide in AM  D/c IVF  Strict I&O Daily weights   CKD stage III  Cr stable  Monitor kidney function as patient is on diuretics  Check BMP in AM   DM type 2 - CBG's stable  Continue current regimen  Monitor CBG's   HTN - stable  Continue to monitor  Patient on diuretics   DVT prophylaxis: SCD's  Code Status: FULL  Family Communication: Family at bedside  Disposition Plan: Home when medically stable   Consultants:   GI - [facility]   Procedures:   ECHO 10/17/16 ------------------------------------------------------------------- Study Conclusions  - Left ventricle: The cavity size was normal. Systolic function was   normal. The estimated ejection fraction was in the range of 60%   to 65%. Hypokinesis of the mid-apicalanteroseptal myocardium.   Features are consistent with a pseudonormal left ventricular   filling pattern, with concomitant abnormal relaxation and  increased filling pressure (grade 2 diastolic dysfunction).   Doppler parameters are consistent with high ventricular filling   pressure. - Ventricular septum: Septal motion showed &quot;bounce&quot;. The contour   showed diastolic flattening consistent with right ventricular   volume overload. - Aortic valve: There was mild stenosis. There was no   regurgitation. Valve area (VTI): 1.1 cm^2. Valve area (Vmax): 1.1   cm^2. Valve area (Vmean): 0.96 cm^2. - Mitral valve: Severely calcified annulus. Calcification.   Thickening. The findings are consistent with mild stenosis. There   was trivial regurgitation. Valve area by continuity equation   (using LVOT flow): 1.74 cm^2. - Left atrium: The atrium was severely dilated. - Right ventricle: The cavity size was normal. Wall thickness was   normal. Systolic function was normal. - Atrial septum: No defect or patent foramen ovale was identified   by color flow Doppler. - Tricuspid valve: There was moderate regurgitation. - Pulmonary arteries: Systolic pressure was mildly to moderately   increased. PA peak pressure: 48 mm Hg (S). - Pericardium, extracardiac: Ascites was noted.  Antimicrobials: Anti-infectives    None        Objective: Vitals:   10/17/16 0512 10/17/16 1327 10/17/16 1621 10/17/16 1645  BP:  (!) 108/57 109/62 113/62  Pulse:  83 84 85  Resp:  16 18 16   Temp: 98 F (36.7 C) 97.7 F (36.5 C) 98.9 F (37.2 C) 98.4 F (36.9 C)  TempSrc: Oral Oral Oral Oral  SpO2:  100% 100% 100%  Weight:      Height:  Intake/Output Summary (Last 24 hours) at 10/17/16 1815 Last data filed at 10/17/16 1814  Gross per 24 hour  Intake              745 ml  Output              400 ml  Net              345 ml   Filed Weights   10/16/16 1618 10/16/16 2103 10/17/16 0500  Weight: 67.1 kg (148 lb) 71 kg (156 lb 8.4 oz) 71.1 kg (156 lb 12 oz)    Examination:  General exam: Appears calm and comfortable  HEENT: AC/AT, PERRLA, OP  moist and clear, conjunctiva pale  Respiratory system: Breath sounds diminished bilaterally, diffuse crackles Cardiovascular system: S1 & S2 heard, RRR. Holosystolic murmur 4/6 Gastrointestinal system: Abdomen is nondistended, soft and nontender. No organomegaly or masses felt. Normal bowel sounds heard. Central nervous system: Alert and oriented. No focal neurological deficits. Extremities: LE 1+ pitting edema b/l , strength 5/5   Skin: Dry, pale, no rashes  Psychiatry: Judgement and insight appear normal. Mood & affect appropriate.    Data Reviewed: I have personally reviewed following labs and imaging studies  CBC:  Recent Labs Lab 10/16/16 1640 10/17/16 0553  WBC 6.1 5.3  NEUTROABS 4.1  --   HGB 7.9* 7.4*  HCT 24.8* 22.9*  MCV 90.8 89.8  PLT 194 161   Basic Metabolic Panel:  Recent Labs Lab 10/16/16 1640 10/17/16 0553  NA 139 141  K 4.1 3.6  CL 102 104  CO2 26 29  GLUCOSE 130* 112*  BUN 51* 45*  CREATININE 1.62* 1.37*  CALCIUM 8.5* 8.5*   GFR: Estimated Creatinine Clearance: 27.2 mL/min (A) (by C-G formula based on SCr of 1.37 mg/dL (H)). Liver Function Tests:  Recent Labs Lab 10/16/16 1640 10/17/16 0553  AST 35 28  ALT 18 15  ALKPHOS 88 72  BILITOT 0.8 0.7  PROT 7.0 6.2*  ALBUMIN 3.5 3.1*   No results for input(s): LIPASE, AMYLASE in the last 168 hours. No results for input(s): AMMONIA in the last 168 hours. Coagulation Profile: No results for input(s): INR, PROTIME in the last 168 hours. Cardiac Enzymes: No results for input(s): CKTOTAL, CKMB, CKMBINDEX, TROPONINI in the last 168 hours. BNP (last 3 results) No results for input(s): PROBNP in the last 8760 hours. HbA1C: No results for input(s): HGBA1C in the last 72 hours. CBG: No results for input(s): GLUCAP in the last 168 hours. Lipid Profile: No results for input(s): CHOL, HDL, LDLCALC, TRIG, CHOLHDL, LDLDIRECT in the last 72 hours. Thyroid Function Tests: No results for input(s): TSH,  T4TOTAL, FREET4, T3FREE, THYROIDAB in the last 72 hours. Anemia Panel:  Recent Labs  10/17/16 0553  VITAMINB12 292  FERRITIN 23  TIBC 413  IRON 24*   Sepsis Labs: No results for input(s): PROCALCITON, LATICACIDVEN in the last 168 hours.  No results found for this or any previous visit (from the past 240 hour(s)).       Radiology Studies: X-ray Chest Pa And Lateral  Result Date: 10/17/2016 CLINICAL DATA:  Dyspnea.  History of hypertension and diabetes. EXAM: CHEST  2 VIEW COMPARISON:  None. FINDINGS: Cardiac silhouette is mildly enlarged. Mildly calcified aortic knob. Diffuse interstitial prominence with small RIGHT pleural effusion. No pneumothorax. Osteopenia. Surgical clips in the included right abdomen compatible with cholecystectomy. IMPRESSION: Mild cardiomegaly and diffuse interstitial prominence favoring pulmonary edema. Small RIGHT pleural effusion. Electronically Signed  By: Elon Alas M.D.   On: 10/17/2016 01:58   Ct Chest Wo Contrast  Result Date: 10/17/2016 CLINICAL DATA:  83 year old female status post fracture with right hip region hematoma. Shortness of breath. EXAM: CT CHEST WITHOUT CONTRAST TECHNIQUE: Multidetector CT imaging of the chest was performed following the standard protocol without IV contrast. COMPARISON:  Chest radiographs 10/16/2016 FINDINGS: Cardiovascular: Calcified aortic and coronary artery atherosclerosis. Evidence of prior mitral valve repair. Mild cardiomegaly. No pericardial effusion. Central pulmonary artery enlargement, estimated at up to 30 mm diameter of each main pulmonary artery. Mediastinum/Nodes: Upper limits of normal right peritracheal and precarinal lymph nodes, nonspecific. No definite hilar lymphadenopathy in the absence of IV contrast. Lungs/Pleura: Major airways are patent. Small layering left pleural effusion, about 2 cm in thickness. Trace layering right pleural effusion. Mild architectural distortion in the right upper lobe.  Confluent partially calcified posterior basal segment lower lobe peribronchial opacity (series 2, image 98), measuring about 19 mm. Superimposed lateral basal segment right lower lobe calcified granuloma. Increased bilateral lower lobe, middle lobe, and right upper lobe subpleural reticular opacity. Upper Abdomen: Small to moderate volume free fluid in the upper abdomen. Liver volume appears decreased over that expected, questionable mildly nodular liver contour. Surgically absent gallbladder. Partially visible previous gastric bypass. Retained stool to splenic flexure. Negative visible noncontrast adrenal glands and kidneys. Musculoskeletal: Flowing osteophytes in the thoracic spine. No acute osseous abnormality identified. IMPRESSION: 1. Cardiomegaly, calcified aortic and coronary artery atherosclerosis, and enlarged central pulmonary arteries suggesting a degree of pulmonary artery hypertension. 2. Small layering left and trace right pleural effusions. 3. Chronic lung changes including some architectural distortion in the right lung and right greater than left subpleural reticular opacity which might reflect a degree of fibrosis or chronic fibrothorax. 4. Nonspecific confluent peribronchial opacity in the right lower lobe, but partially calcified. Favor chronic round atelectasis over acute bronchopneumonia. 5. Partially visible abdominal ascites.  Possible liver cirrhosis. 6. Prior gastric bypass. Electronically Signed   By: Genevie Ann M.D.   On: 10/17/2016 09:58      Scheduled Meds: . allopurinol  100 mg Oral Daily  . docusate sodium  100 mg Oral BID  . folic acid  1 mg Oral Daily  . furosemide  20 mg Intravenous BID  . levothyroxine  88 mcg Oral QAC breakfast  . loratadine  10 mg Oral Daily  . magnesium gluconate  500 mg Oral BID  . polyethylene glycol  17 g Oral Daily  . simvastatin  20 mg Oral q1800  . sodium chloride flush  3 mL Intravenous Q12H  . sodium chloride flush  3 mL Intravenous Q12H    . [START ON 10/18/2016] torsemide  20 mg Oral BID  . traZODone  50 mg Oral QHS  . venlafaxine  75 mg Oral BID   Continuous Infusions: . sodium chloride       LOS: 0 days    Chipper Oman, MD Pager: Text Page via [email or web address]  [phone]  If 7PM-7AM, please contact night-coverage [email or web address] Password [email or web address] 10/17/2016, 6:15 PM

## 2016-10-18 ENCOUNTER — Encounter (HOSPITAL_COMMUNITY): Payer: Self-pay

## 2016-10-18 ENCOUNTER — Encounter (HOSPITAL_COMMUNITY)
Admission: EM | Disposition: A | Discharge status: Home / Self Care | Payer: Self-pay | Source: Home / Self Care | Attending: Emergency Medicine

## 2016-10-18 DIAGNOSIS — D649 Anemia, unspecified: Secondary | ICD-10-CM | POA: Diagnosis not present

## 2016-10-18 DIAGNOSIS — E118 Type 2 diabetes mellitus with unspecified complications: Secondary | ICD-10-CM | POA: Diagnosis not present

## 2016-10-18 DIAGNOSIS — N183 Chronic kidney disease, stage 3 (moderate): Secondary | ICD-10-CM | POA: Diagnosis not present

## 2016-10-18 DIAGNOSIS — I10 Essential (primary) hypertension: Secondary | ICD-10-CM | POA: Diagnosis not present

## 2016-10-18 HISTORY — PX: ESOPHAGOGASTRODUODENOSCOPY: SHX5428

## 2016-10-18 LAB — CBC WITH DIFFERENTIAL/PLATELET
BASOS ABS: 0 10*3/uL (ref 0.0–0.1)
Basophils Relative: 0 %
EOS PCT: 4 %
Eosinophils Absolute: 0.2 10*3/uL (ref 0.0–0.7)
HEMATOCRIT: 25.9 % — AB (ref 36.0–46.0)
HEMOGLOBIN: 7.9 g/dL — AB (ref 12.0–15.0)
LYMPHS ABS: 1 10*3/uL (ref 0.7–4.0)
LYMPHS PCT: 17 %
MCH: 28.5 pg (ref 26.0–34.0)
MCHC: 30.5 g/dL (ref 30.0–36.0)
MCV: 93.5 fL (ref 78.0–100.0)
Monocytes Absolute: 0.7 10*3/uL (ref 0.1–1.0)
Monocytes Relative: 11 %
NEUTROS ABS: 4 10*3/uL (ref 1.7–7.7)
Neutrophils Relative %: 68 %
Platelets: 182 10*3/uL (ref 150–400)
RBC: 2.77 MIL/uL — AB (ref 3.87–5.11)
RDW: 16.1 % — ABNORMAL HIGH (ref 11.5–15.5)
WBC: 5.9 10*3/uL (ref 4.0–10.5)

## 2016-10-18 LAB — BASIC METABOLIC PANEL
ANION GAP: 7 (ref 5–15)
BUN: 42 mg/dL — ABNORMAL HIGH (ref 6–20)
CHLORIDE: 106 mmol/L (ref 101–111)
CO2: 27 mmol/L (ref 22–32)
Calcium: 8.3 mg/dL — ABNORMAL LOW (ref 8.9–10.3)
Creatinine, Ser: 1.25 mg/dL — ABNORMAL HIGH (ref 0.44–1.00)
GFR calc Af Amer: 44 mL/min — ABNORMAL LOW (ref >60)
GFR, EST NON AFRICAN AMERICAN: 38 mL/min — AB (ref >60)
Glucose, Bld: 116 mg/dL — ABNORMAL HIGH (ref 65–99)
POTASSIUM: 4.1 mmol/L (ref 3.5–5.1)
SODIUM: 140 mmol/L (ref 135–145)

## 2016-10-18 LAB — PREPARE RBC (CROSSMATCH)

## 2016-10-18 SURGERY — EGD (ESOPHAGOGASTRODUODENOSCOPY)
Anesthesia: Moderate Sedation

## 2016-10-18 MED ORDER — BUTAMBEN-TETRACAINE-BENZOCAINE 2-2-14 % EX AERO
INHALATION_SPRAY | CUTANEOUS | Status: DC | PRN
Start: 2016-10-18 — End: 2016-10-18
  Administered 2016-10-18: 2 via TOPICAL

## 2016-10-18 MED ORDER — MIDAZOLAM HCL 10 MG/2ML IJ SOLN
INTRAMUSCULAR | Status: DC | PRN
  Administered 2016-10-18 (×2): 2 mg via INTRAVENOUS

## 2016-10-18 MED ORDER — FENTANYL CITRATE (PF) 100 MCG/2ML IJ SOLN
INTRAMUSCULAR | Status: AC
  Filled 2016-10-18: qty 2

## 2016-10-18 MED ORDER — MIDAZOLAM HCL 5 MG/ML IJ SOLN
INTRAMUSCULAR | Status: AC
  Filled 2016-10-18: qty 2

## 2016-10-18 MED ORDER — SENNA 8.6 MG PO TABS
1.0000 | ORAL_TABLET | Freq: Every day | ORAL | Status: DC
  Administered 2016-10-18: 8.6 mg via ORAL
  Filled 2016-10-18: qty 1

## 2016-10-18 MED ORDER — FENTANYL CITRATE (PF) 100 MCG/2ML IJ SOLN
INTRAMUSCULAR | Status: DC | PRN
  Administered 2016-10-18 (×2): 25 ug via INTRAVENOUS

## 2016-10-18 MED ORDER — PANTOPRAZOLE SODIUM 40 MG PO TBEC
40.0000 mg | DELAYED_RELEASE_TABLET | Freq: Two times a day (BID) | ORAL | Status: DC
  Administered 2016-10-18 – 2016-10-19 (×3): 40 mg via ORAL
  Filled 2016-10-18 (×3): qty 1

## 2016-10-18 MED ORDER — FUROSEMIDE 10 MG/ML IJ SOLN
20.0000 mg | Freq: Once | INTRAMUSCULAR | Status: AC
  Administered 2016-10-18: 20 mg via INTRAVENOUS
  Filled 2016-10-18: qty 2

## 2016-10-18 MED ORDER — SODIUM CHLORIDE 0.9 % IV SOLN
Freq: Once | INTRAVENOUS | Status: AC
  Administered 2016-10-18: 17:00:00 via INTRAVENOUS

## 2016-10-18 MED ORDER — TORSEMIDE 20 MG PO TABS
40.0000 mg | ORAL_TABLET | Freq: Two times a day (BID) | ORAL | Status: DC
  Administered 2016-10-19: 40 mg via ORAL
  Filled 2016-10-18: qty 2

## 2016-10-18 MED ORDER — FERROUS SULFATE 325 (65 FE) MG PO TABS
325.0000 mg | ORAL_TABLET | Freq: Three times a day (TID) | ORAL | Status: DC
  Administered 2016-10-19 (×2): 325 mg via ORAL
  Filled 2016-10-18 (×2): qty 1

## 2016-10-18 NOTE — Op Note (Signed)
[facility] Patient Name: Madison Riggs Procedure Date: 10/18/2016 MRN: [id number] Attending MD: Missy Sabins , MD Date of Birth: 1928/06/06 CSN: [id number] Age: 83 Admit Type: Inpatient Procedure:                Upper GI endoscopy Indications:              Iron deficiency anemia due to suspected upper                            gastrointestinal bleeding, Dysphagia Providers:                Elyse Jarvis. Amedeo Plenty, MD, Lillie Fragmin, RN, Burtis Junes,                            RN, Marcene Duos, Technician, William Dalton,                            Technician Referring MD:              Medicines:                Fentanyl 50 micrograms IV, Midazolam 4 mg IV Complications:            No immediate complications. Estimated Blood Loss:     Estimated blood loss: none. Procedure:                Pre-Anesthesia Assessment:                           - Prior to the procedure, a History and Physical                            was performed, and patient medications and                            allergies were reviewed. The patient's tolerance of                            previous anesthesia was also reviewed. The risks                            and benefits of the procedure and the sedation                            options and risks were discussed with the patient.                            All questions were answered, and informed consent                            was obtained. Prior Anticoagulants: The patient has                            taken no previous anticoagulant or antiplatelet  agents. ASA Grade Assessment: II - A patient with                            mild systemic disease. After reviewing the risks                            and benefits, the patient was deemed in                            satisfactory condition to undergo the procedure.                           After obtaining informed consent, the endoscope was   passed under direct vision. Throughout the                            procedure, the patient's blood pressure, pulse, and                            oxygen saturations were monitored continuously. The                            [id number] ([id number]) scope was introduced through the                            mouth, and advanced to the afferent and efferent                            jejunal loops. The upper GI endoscopy was                            accomplished without difficulty. The patient                            tolerated the procedure well. Scope In: Scope Out: Findings:      LA Grade D (one or more mucosal breaks involving at least 75% of       esophageal circumference) esophagitis with no bleeding was found 21 to       38 cm from the incisors.      Evidence of a Roux-en-Y gastrojejunostomy was found. The gastrojejunal       anastomosis was characterized by healthy appearing mucosa. The       jejunojejunal anastomosis was characterized by healthy appearing mucosa.       The duodenum-to-jejunum limb was not examined as it could not be found. Impression:               - LA Grade D reflux esophagitis.                           - Roux-en-Y gastrojejunostomy with gastrojejunal                            anastomosis characterized by healthy appearing  mucosa.                           - No specimens collected. Moderate Sedation:      Moderate (conscious) sedation was administered by the endoscopy nurse       and supervised by the endoscopist. The following parameters were       monitored: oxygen saturation, heart rate, blood pressure, and response       to care. Recommendation:           - Use Protonix (pantoprazole) 40 mg PO BID.                           - Follow an antireflux regimen.                           - Continue present medications. Procedure Code(s):        --- Professional ---                           [id number], Esophagogastroduodenoscopy,  flexible,                            transoral; diagnostic, including collection of                            specimen(s) by brushing or washing, when performed                            (separate procedure) Diagnosis Code(s):        --- Professional ---                           K21.0, Gastro-esophageal reflux disease with                            esophagitis                           Z98.0, Intestinal bypass and anastomosis status                           D50.9, Iron deficiency anemia, unspecified                           R13.10, Dysphagia, unspecified CPT copyright 2016 [employer]. All rights reserved. The codes documented in this report are preliminary and upon coder review may  be revised to meet current compliance requirements. Missy Sabins, MD 10/18/2016 11:07:33 AM This report has been signed electronically. Number of Addenda: 0

## 2016-10-18 NOTE — H&P (View-Only) (Signed)
PROGRESS NOTE [facility]   Madison Riggs   EHM:[id number] DOB: July 06, 1927  DOA: 10/16/2016 PCP: Corine Shelter, PA-C   Brief Narrative:  83 year old female with past medical history of chronic kidney disease stage III, hypertension, hyperlipidemia and anemia who was sent by PCP due to anemia. Patient was elevated in the ED and found to her hemoglobin was 7.9 with Hemoccult positive and was admitted for GI evaluation.  Subjective: Patient seen and examined this morning she report some dyspnea and weakness when ambulating. Also feeling dizziness when she stands.  Assessment & Plan: Symptomatic anemia, acute on chronic blood loss. Iron deficiency. Suspect GI bleed We'll transfuse 1 unit given symptoms GI consulted planning for endoscopy in the morning. Check CBC in the morning We'll give IV Iron x 3 doses   Acute on Chronic diastolic HF - CXR concern for pulm edema, on exam crackles at the base  ECHO normal EF, G2DD Will give 1 dose of lasix now and one dose after transfusion  Will resume torsemide in AM  D/c IVF  Strict I&O Daily weights   CKD stage III  Cr stable  Monitor kidney function as patient is on diuretics  Check BMP in AM   DM type 2 - CBG's stable  Continue current regimen  Monitor CBG's   HTN - stable  Continue to monitor  Patient on diuretics   DVT prophylaxis: SCD's  Code Status: FULL  Family Communication: Family at bedside  Disposition Plan: Home when medically stable   Consultants:   GI - [facility]   Procedures:   ECHO 10/17/16 ------------------------------------------------------------------- Study Conclusions  - Left ventricle: The cavity size was normal. Systolic function was   normal. The estimated ejection fraction was in the range of 60%   to 65%. Hypokinesis of the mid-apicalanteroseptal myocardium.   Features are consistent with a pseudonormal left ventricular   filling pattern, with concomitant abnormal relaxation and  increased filling pressure (grade 2 diastolic dysfunction).   Doppler parameters are consistent with high ventricular filling   pressure. - Ventricular septum: Septal motion showed &quot;bounce&quot;. The contour   showed diastolic flattening consistent with right ventricular   volume overload. - Aortic valve: There was mild stenosis. There was no   regurgitation. Valve area (VTI): 1.1 cm^2. Valve area (Vmax): 1.1   cm^2. Valve area (Vmean): 0.96 cm^2. - Mitral valve: Severely calcified annulus. Calcification.   Thickening. The findings are consistent with mild stenosis. There   was trivial regurgitation. Valve area by continuity equation   (using LVOT flow): 1.74 cm^2. - Left atrium: The atrium was severely dilated. - Right ventricle: The cavity size was normal. Wall thickness was   normal. Systolic function was normal. - Atrial septum: No defect or patent foramen ovale was identified   by color flow Doppler. - Tricuspid valve: There was moderate regurgitation. - Pulmonary arteries: Systolic pressure was mildly to moderately   increased. PA peak pressure: 48 mm Hg (S). - Pericardium, extracardiac: Ascites was noted.  Antimicrobials: Anti-infectives    None        Objective: Vitals:   10/17/16 0512 10/17/16 1327 10/17/16 1621 10/17/16 1645  BP:  (!) 108/57 109/62 113/62  Pulse:  83 84 85  Resp:  16 18 16   Temp: 98 F (36.7 C) 97.7 F (36.5 C) 98.9 F (37.2 C) 98.4 F (36.9 C)  TempSrc: Oral Oral Oral Oral  SpO2:  100% 100% 100%  Weight:      Height:  Intake/Output Summary (Last 24 hours) at 10/17/16 1815 Last data filed at 10/17/16 1814  Gross per 24 hour  Intake              745 ml  Output              400 ml  Net              345 ml   Filed Weights   10/16/16 1618 10/16/16 2103 10/17/16 0500  Weight: 67.1 kg (148 lb) 71 kg (156 lb 8.4 oz) 71.1 kg (156 lb 12 oz)    Examination:  General exam: Appears calm and comfortable  HEENT: AC/AT, PERRLA, OP  moist and clear, conjunctiva pale  Respiratory system: Breath sounds diminished bilaterally, diffuse crackles Cardiovascular system: S1 & S2 heard, RRR. Holosystolic murmur 4/6 Gastrointestinal system: Abdomen is nondistended, soft and nontender. No organomegaly or masses felt. Normal bowel sounds heard. Central nervous system: Alert and oriented. No focal neurological deficits. Extremities: LE 1+ pitting edema b/l , strength 5/5   Skin: Dry, pale, no rashes  Psychiatry: Judgement and insight appear normal. Mood & affect appropriate.    Data Reviewed: I have personally reviewed following labs and imaging studies  CBC:  Recent Labs Lab 10/16/16 1640 10/17/16 0553  WBC 6.1 5.3  NEUTROABS 4.1  --   HGB 7.9* 7.4*  HCT 24.8* 22.9*  MCV 90.8 89.8  PLT 194 824   Basic Metabolic Panel:  Recent Labs Lab 10/16/16 1640 10/17/16 0553  NA 139 141  K 4.1 3.6  CL 102 104  CO2 26 29  GLUCOSE 130* 112*  BUN 51* 45*  CREATININE 1.62* 1.37*  CALCIUM 8.5* 8.5*   GFR: Estimated Creatinine Clearance: 27.2 mL/min (A) (by C-G formula based on SCr of 1.37 mg/dL (H)). Liver Function Tests:  Recent Labs Lab 10/16/16 1640 10/17/16 0553  AST 35 28  ALT 18 15  ALKPHOS 88 72  BILITOT 0.8 0.7  PROT 7.0 6.2*  ALBUMIN 3.5 3.1*   No results for input(s): LIPASE, AMYLASE in the last 168 hours. No results for input(s): AMMONIA in the last 168 hours. Coagulation Profile: No results for input(s): INR, PROTIME in the last 168 hours. Cardiac Enzymes: No results for input(s): CKTOTAL, CKMB, CKMBINDEX, TROPONINI in the last 168 hours. BNP (last 3 results) No results for input(s): PROBNP in the last 8760 hours. HbA1C: No results for input(s): HGBA1C in the last 72 hours. CBG: No results for input(s): GLUCAP in the last 168 hours. Lipid Profile: No results for input(s): CHOL, HDL, LDLCALC, TRIG, CHOLHDL, LDLDIRECT in the last 72 hours. Thyroid Function Tests: No results for input(s): TSH,  T4TOTAL, FREET4, T3FREE, THYROIDAB in the last 72 hours. Anemia Panel:  Recent Labs  10/17/16 0553  VITAMINB12 292  FERRITIN 23  TIBC 413  IRON 24*   Sepsis Labs: No results for input(s): PROCALCITON, LATICACIDVEN in the last 168 hours.  No results found for this or any previous visit (from the past 240 hour(s)).       Radiology Studies: X-ray Chest Pa And Lateral  Result Date: 10/17/2016 CLINICAL DATA:  Dyspnea.  History of hypertension and diabetes. EXAM: CHEST  2 VIEW COMPARISON:  None. FINDINGS: Cardiac silhouette is mildly enlarged. Mildly calcified aortic knob. Diffuse interstitial prominence with small RIGHT pleural effusion. No pneumothorax. Osteopenia. Surgical clips in the included right abdomen compatible with cholecystectomy. IMPRESSION: Mild cardiomegaly and diffuse interstitial prominence favoring pulmonary edema. Small RIGHT pleural effusion. Electronically Signed  By: Elon Alas M.D.   On: 10/17/2016 01:58   Ct Chest Wo Contrast  Result Date: 10/17/2016 CLINICAL DATA:  83 year old female status post fracture with right hip region hematoma. Shortness of breath. EXAM: CT CHEST WITHOUT CONTRAST TECHNIQUE: Multidetector CT imaging of the chest was performed following the standard protocol without IV contrast. COMPARISON:  Chest radiographs 10/16/2016 FINDINGS: Cardiovascular: Calcified aortic and coronary artery atherosclerosis. Evidence of prior mitral valve repair. Mild cardiomegaly. No pericardial effusion. Central pulmonary artery enlargement, estimated at up to 30 mm diameter of each main pulmonary artery. Mediastinum/Nodes: Upper limits of normal right peritracheal and precarinal lymph nodes, nonspecific. No definite hilar lymphadenopathy in the absence of IV contrast. Lungs/Pleura: Major airways are patent. Small layering left pleural effusion, about 2 cm in thickness. Trace layering right pleural effusion. Mild architectural distortion in the right upper lobe.  Confluent partially calcified posterior basal segment lower lobe peribronchial opacity (series 2, image 98), measuring about 19 mm. Superimposed lateral basal segment right lower lobe calcified granuloma. Increased bilateral lower lobe, middle lobe, and right upper lobe subpleural reticular opacity. Upper Abdomen: Small to moderate volume free fluid in the upper abdomen. Liver volume appears decreased over that expected, questionable mildly nodular liver contour. Surgically absent gallbladder. Partially visible previous gastric bypass. Retained stool to splenic flexure. Negative visible noncontrast adrenal glands and kidneys. Musculoskeletal: Flowing osteophytes in the thoracic spine. No acute osseous abnormality identified. IMPRESSION: 1. Cardiomegaly, calcified aortic and coronary artery atherosclerosis, and enlarged central pulmonary arteries suggesting a degree of pulmonary artery hypertension. 2. Small layering left and trace right pleural effusions. 3. Chronic lung changes including some architectural distortion in the right lung and right greater than left subpleural reticular opacity which might reflect a degree of fibrosis or chronic fibrothorax. 4. Nonspecific confluent peribronchial opacity in the right lower lobe, but partially calcified. Favor chronic round atelectasis over acute bronchopneumonia. 5. Partially visible abdominal ascites.  Possible liver cirrhosis. 6. Prior gastric bypass. Electronically Signed   By: Genevie Ann M.D.   On: 10/17/2016 09:58      Scheduled Meds: . allopurinol  100 mg Oral Daily  . docusate sodium  100 mg Oral BID  . folic acid  1 mg Oral Daily  . furosemide  20 mg Intravenous BID  . levothyroxine  88 mcg Oral QAC breakfast  . loratadine  10 mg Oral Daily  . magnesium gluconate  500 mg Oral BID  . polyethylene glycol  17 g Oral Daily  . simvastatin  20 mg Oral q1800  . sodium chloride flush  3 mL Intravenous Q12H  . sodium chloride flush  3 mL Intravenous Q12H    . [START ON 10/18/2016] torsemide  20 mg Oral BID  . traZODone  50 mg Oral QHS  . venlafaxine  75 mg Oral BID   Continuous Infusions: . sodium chloride       LOS: 0 days    Chipper Oman, MD Pager: Text Page via [email or web address]  [phone]  If 7PM-7AM, please contact night-coverage [email or web address] Password [email or web address] 10/17/2016, 6:15 PM

## 2016-10-18 NOTE — Evaluation (Signed)
Physical Therapy Evaluation Patient Details Name: Madison Riggs MRN: [id number] DOB: May 26, 1928 Today's Date: 10/18/2016   History of Present Illness   Madison Riggs  is a 83 y.o. female adm with anemia; hx of  CKD stage3, Hypertension, Hyperlipidemia, Anemia, back surgery, recent fall (10/11/16)  Clinical Impression  Pt admitted with above diagnosis. Pt currently with functional limitations due to the deficits listed below (see PT Problem List). *Pt will benefit from skilled PT to increase their independence and safety with mobility to allow discharge to the venue listed below.     Follow Up Recommendations Home health PT;Supervision for mobility/OOB    Equipment Recommendations  None recommended by PT    Recommendations for Other Services       Precautions / Restrictions Precautions Precautions: Fall      Mobility  Bed Mobility Overal bed mobility: Needs Assistance Bed Mobility: Supine to Sit     Supine to sit: Min guard     General bed mobility comments: for trunk to upright, incr itme  Transfers Overall transfer level: Needs assistance Equipment used: Rolling walker (2 wheeled) Transfers: Sit to/from Stand Sit to Stand: Min assist         General transfer comment: cues for safety  Ambulation/Gait Ambulation/Gait assistance: Min guard;Min assist Ambulation Distance (Feet): 12 Feet (in room) Assistive device: Rolling walker (2 wheeled) Gait Pattern/deviations: Step-through pattern;Narrow base of support;Trunk flexed;Decreased stride length     General Gait Details: cues for RW safety, light assist for balance to maneuver in small space   Stairs            Wheelchair Mobility    Modified Rankin (Stroke Patients Only)       Balance Overall balance assessment: Needs assistance   Sitting balance-Leahy Scale: Good       Standing balance-Leahy Scale: Poor Standing balance comment: reliant on UEs                               Pertinent Vitals/Pain Pain Assessment: No/denies pain Pain Location: has chronic back pain, just had  meds    Home Living Family/patient expects to be discharged to:: Private residence Living Arrangements: Children Available Help at Discharge: Family Type of Home: House       Home Layout: One level;Able to live on main level with bedroom/bathroom Home Equipment: Kasandra Knudsen - single point;Walker - 4 wheels      Prior Function Level of Independence: Needs assistance   Gait / Transfers Assistance Needed: uses rollator since recent fall; prior to fall using rollator for longer distances  ADL's / Homemaking Assistance Needed: sponge bathes I'ly        Hand Dominance        Extremity/Trunk Assessment   Upper Extremity Assessment Upper Extremity Assessment: Overall WFL for tasks assessed    Lower Extremity Assessment Lower Extremity Assessment: Generalized weakness       Communication      Cognition Arousal/Alertness: Awake/alert Behavior During Therapy: WFL for tasks assessed/performed Overall Cognitive Status: Within Functional Limits for tasks assessed                                        General Comments      Exercises     Assessment/Plan    PT Assessment Patient needs continued PT services  PT Problem List Decreased strength;Decreased  activity tolerance;Decreased balance;Decreased mobility       PT Treatment Interventions DME instruction;Gait training;Functional mobility training;Therapeutic activities;Therapeutic exercise;Patient/family education    PT Goals (Current goals can be found in the Care Plan section)  Acute Rehab PT Goals Patient Stated Goal: home, back to PLOF PT Goal Formulation: With patient/family Time For Goal Achievement: 10/25/16 Potential to Achieve Goals: Good    Frequency Min 3X/week   Barriers to discharge        Co-evaluation               AM-PAC PT "6 Clicks" Daily Activity  Outcome Measure  Difficulty turning over in bed (including adjusting bedclothes, sheets and blankets)?: A Little Difficulty moving from lying on back to sitting on the side of the bed? : A Little Difficulty sitting down on and standing up from a chair with arms (e.g., wheelchair, bedside commode, etc,.)?: A Little Help needed moving to and from a bed to chair (including a wheelchair)?: A Little Help needed walking in hospital room?: A Little Help needed climbing 3-5 steps with a railing? : A Little 6 Click Score: 18    End of Session   Activity Tolerance: Patient tolerated treatment well Patient left: with call bell/phone within reach;in chair;with family/visitor present   PT Visit Diagnosis: Unsteadiness on feet (R26.81)    Time: 9735-3299 PT Time Calculation (min) (ACUTE ONLY): 24 min   Charges:   PT Evaluation $PT Eval Low Complexity: 1 Procedure PT Treatments $Gait Training: 8-22 mins   PT G Codes:   PT G-Codes **NOT FOR INPATIENT CLASS** Functional Assessment Tool Used: AM-PAC 6 Clicks Basic Mobility;Clinical judgement Functional Limitation: Mobility: Walking and moving around Mobility: Walking and Moving Around Current Status (M4268): At least 1 percent but less than 20 percent impaired, limited or restricted Mobility: Walking and Moving Around Goal Status [phone]): At least 1 percent but less than 20 percent impaired, limited or restricted       [facility] 10/18/2016, 4:21 PM

## 2016-10-18 NOTE — Progress Notes (Addendum)
PROGRESS NOTE [facility]   Madison Riggs   ZDG:[id number] DOB: 01-Mar-1928  DOA: 10/16/2016 PCP: Corine Shelter, PA-C   Brief Narrative:  83 year old female with past medical history of chronic kidney disease stage III, hypertension, hyperlipidemia and anemia who was sent by PCP due to anemia. Patient was elevated in the ED and found to her hemoglobin was 7.9 with Hemoccult positive and was admitted for GI evaluation.  Subjective: Patient seen and examined she is status post EGD. Feels slightly better today but still with some shortness of breath.  Assessment & Plan: Symptomatic anemia, acute on chronic blood loss. Severe Iron deficiency.  Status post 1 unit of PRBCs, will give another unit today given patient with symptoms Endoscopy revealed reflux esophagitis with active bleeding Receiving another iron infusion today I'll start on ferrous sulfate 3 times a day with Senokot daily Repeat CBC in the morning if stable likely to be discharged home  Acute on Chronic diastolic HF - Initial CXR concern for pulm edema, on exam crackles at the base  ECHO normal EF, G2DD Torsemide increase it to 40 mg twice a day We'll repeat chest x-ray Strict I&O Daily weights   CKD stage III  Cr improving  Monitor kidney function as patient is on diuretics  Check BMP in AM   DM type 2 - CBG's stable  Continue current regimen  Monitor CBG's   HTN - stable  Continue to monitor  Patient on diuretics   DVT prophylaxis: SCD's  Code Status: FULL  Family Communication: Family at bedside  Disposition Plan: Home when medically stable   Consultants:   GI - [facility]   Procedures:   ECHO 10/17/16 ------------------------------------------------------------------- Study Conclusions  - Left ventricle: The cavity size was normal. Systolic function was   normal. The estimated ejection fraction was in the range of 60%   to 65%. Hypokinesis of the mid-apicalanteroseptal myocardium.   Features  are consistent with a pseudonormal left ventricular   filling pattern, with concomitant abnormal relaxation and   increased filling pressure (grade 2 diastolic dysfunction).   Doppler parameters are consistent with high ventricular filling   pressure. - Ventricular septum: Septal motion showed &quot;bounce&quot;. The contour   showed diastolic flattening consistent with right ventricular   volume overload. - Aortic valve: There was mild stenosis. There was no   regurgitation. Valve area (VTI): 1.1 cm^2. Valve area (Vmax): 1.1   cm^2. Valve area (Vmean): 0.96 cm^2. - Mitral valve: Severely calcified annulus. Calcification.   Thickening. The findings are consistent with mild stenosis. There   was trivial regurgitation. Valve area by continuity equation   (using LVOT flow): 1.74 cm^2. - Left atrium: The atrium was severely dilated. - Right ventricle: The cavity size was normal. Wall thickness was   normal. Systolic function was normal. - Atrial septum: No defect or patent foramen ovale was identified   by color flow Doppler. - Tricuspid valve: There was moderate regurgitation. - Pulmonary arteries: Systolic pressure was mildly to moderately   increased. PA peak pressure: 48 mm Hg (S). - Pericardium, extracardiac: Ascites was noted.  Antimicrobials: Anti-infectives    None       Objective: Vitals:   10/18/16 1434 10/18/16 1502 10/18/16 1624 10/18/16 1644  BP: (!) 105/55 (!) 108/59 116/85 118/80  Pulse: 80 80 82 85  Resp: 16 19 16 16   Temp: 98 F (36.7 C) 98.4 F (36.9 C) 98.4 F (36.9 C) 98.8 F (37.1 C)  TempSrc: Axillary Oral Oral Oral  SpO2:  100% 100% 95% 95%  Weight:      Height:        Intake/Output Summary (Last 24 hours) at 10/18/16 1826 Last data filed at 10/18/16 1624  Gross per 24 hour  Intake             1253 ml  Output              750 ml  Net              503 ml   Filed Weights   10/16/16 2103 10/17/16 0500 10/18/16 0629  Weight: 71 kg (156 lb 8.4  oz) 71.1 kg (156 lb 12 oz) 71.7 kg (158 lb 1.1 oz)    Examination:  General exam: NAD HEENT: Conjunctiva pale  Respiratory system: Good air intake, crackle at the bases bilaterally Cardiovascular system: G8-B1 RRR holosystolic murmur 4/6 Gastrointestinal system: Abdomen soft nontender nondistended Central nervous system: Alert and oriented 3 Extremities: Trace lower extremity pitting edema bilaterally  Skin: Pale without rashes Psychiatry: Mood and affect appropriate   Data Reviewed: I have personally reviewed following labs and imaging studies  CBC:  Recent Labs Lab 10/16/16 1640 10/17/16 0553 10/18/16 0512  WBC 6.1 5.3 5.9  NEUTROABS 4.1  --  4.0  HGB 7.9* 7.4* 7.9*  HCT 24.8* 22.9* 25.9*  MCV 90.8 89.8 93.5  PLT 194 194 694   Basic Metabolic Panel:  Recent Labs Lab 10/16/16 1640 10/17/16 0553 10/18/16 0512  NA 139 141 140  K 4.1 3.6 4.1  CL 102 104 106  CO2 26 29 27   GLUCOSE 130* 112* 116*  BUN 51* 45* 42*  CREATININE 1.62* 1.37* 1.25*  CALCIUM 8.5* 8.5* 8.3*   GFR: Estimated Creatinine Clearance: 29.9 mL/min (A) (by C-G formula based on SCr of 1.25 mg/dL (H)). Liver Function Tests:  Recent Labs Lab 10/16/16 1640 10/17/16 0553  AST 35 28  ALT 18 15  ALKPHOS 88 72  BILITOT 0.8 0.7  PROT 7.0 6.2*  ALBUMIN 3.5 3.1*   No results for input(s): LIPASE, AMYLASE in the last 168 hours. No results for input(s): AMMONIA in the last 168 hours. Coagulation Profile: No results for input(s): INR, PROTIME in the last 168 hours. Cardiac Enzymes: No results for input(s): CKTOTAL, CKMB, CKMBINDEX, TROPONINI in the last 168 hours. BNP (last 3 results) No results for input(s): PROBNP in the last 8760 hours. HbA1C: No results for input(s): HGBA1C in the last 72 hours. CBG: No results for input(s): GLUCAP in the last 168 hours. Lipid Profile: No results for input(s): CHOL, HDL, LDLCALC, TRIG, CHOLHDL, LDLDIRECT in the last 72 hours. Thyroid Function  Tests: No results for input(s): TSH, T4TOTAL, FREET4, T3FREE, THYROIDAB in the last 72 hours. Anemia Panel:  Recent Labs  10/17/16 0553  VITAMINB12 292  FERRITIN 23  TIBC 413  IRON 24*   Sepsis Labs: No results for input(s): PROCALCITON, LATICACIDVEN in the last 168 hours.  No results found for this or any previous visit (from the past 240 hour(s)).       Radiology Studies: X-ray Chest Pa And Lateral  Result Date: 10/17/2016 CLINICAL DATA:  Dyspnea.  History of hypertension and diabetes. EXAM: CHEST  2 VIEW COMPARISON:  None. FINDINGS: Cardiac silhouette is mildly enlarged. Mildly calcified aortic knob. Diffuse interstitial prominence with small RIGHT pleural effusion. No pneumothorax. Osteopenia. Surgical clips in the included right abdomen compatible with cholecystectomy. IMPRESSION: Mild cardiomegaly and diffuse interstitial prominence favoring pulmonary edema. Small RIGHT pleural effusion. Electronically Signed  By: Elon Alas M.D.   On: 10/17/2016 01:58   Ct Chest Wo Contrast  Result Date: 10/17/2016 CLINICAL DATA:  83 year old female status post fracture with right hip region hematoma. Shortness of breath. EXAM: CT CHEST WITHOUT CONTRAST TECHNIQUE: Multidetector CT imaging of the chest was performed following the standard protocol without IV contrast. COMPARISON:  Chest radiographs 10/16/2016 FINDINGS: Cardiovascular: Calcified aortic and coronary artery atherosclerosis. Evidence of prior mitral valve repair. Mild cardiomegaly. No pericardial effusion. Central pulmonary artery enlargement, estimated at up to 30 mm diameter of each main pulmonary artery. Mediastinum/Nodes: Upper limits of normal right peritracheal and precarinal lymph nodes, nonspecific. No definite hilar lymphadenopathy in the absence of IV contrast. Lungs/Pleura: Major airways are patent. Small layering left pleural effusion, about 2 cm in thickness. Trace layering right pleural effusion. Mild architectural  distortion in the right upper lobe. Confluent partially calcified posterior basal segment lower lobe peribronchial opacity (series 2, image 98), measuring about 19 mm. Superimposed lateral basal segment right lower lobe calcified granuloma. Increased bilateral lower lobe, middle lobe, and right upper lobe subpleural reticular opacity. Upper Abdomen: Small to moderate volume free fluid in the upper abdomen. Liver volume appears decreased over that expected, questionable mildly nodular liver contour. Surgically absent gallbladder. Partially visible previous gastric bypass. Retained stool to splenic flexure. Negative visible noncontrast adrenal glands and kidneys. Musculoskeletal: Flowing osteophytes in the thoracic spine. No acute osseous abnormality identified. IMPRESSION: 1. Cardiomegaly, calcified aortic and coronary artery atherosclerosis, and enlarged central pulmonary arteries suggesting a degree of pulmonary artery hypertension. 2. Small layering left and trace right pleural effusions. 3. Chronic lung changes including some architectural distortion in the right lung and right greater than left subpleural reticular opacity which might reflect a degree of fibrosis or chronic fibrothorax. 4. Nonspecific confluent peribronchial opacity in the right lower lobe, but partially calcified. Favor chronic round atelectasis over acute bronchopneumonia. 5. Partially visible abdominal ascites.  Possible liver cirrhosis. 6. Prior gastric bypass. Electronically Signed   By: Genevie Ann M.D.   On: 10/17/2016 09:58      Scheduled Meds: . allopurinol  100 mg Oral Daily  . docusate sodium  100 mg Oral BID  . folic acid  1 mg Oral Daily  . furosemide  20 mg Intravenous Once  . levothyroxine  88 mcg Oral QAC breakfast  . loratadine  10 mg Oral Daily  . magnesium gluconate  500 mg Oral BID  . pantoprazole  40 mg Oral BID  . polyethylene glycol  17 g Oral Daily  . simvastatin  20 mg Oral q1800  . sodium chloride flush  3  mL Intravenous Q12H  . sodium chloride flush  3 mL Intravenous Q12H  . torsemide  20 mg Oral BID  . traZODone  50 mg Oral QHS  . venlafaxine  75 mg Oral BID   Continuous Infusions: . sodium chloride       LOS: 0 days    Chipper Oman, MD Pager: Text Page via [email or web address]  [phone]  If 7PM-7AM, please contact night-coverage [email or web address] Password [facility] 10/18/2016, 6:26 PM

## 2016-10-18 NOTE — Consult Note (Signed)
[facility] Consult Note  Referring Provider: No ref. provider found Primary Care Physician:  Corine Shelter, PA-C Primary Gastroenterologist:  Dr.  Laurel Dimmer Complaint: Weakness HPI: Madison Riggs is an 83 y.o. white female  who was told to come to the emergency room after being sent for weakness and fatigue by her primary care physician and found to have a hemoglobin of 7.7 which is down from 10.1 in January. She had recently seen Dr. Paulita Fujita initial GI consultation for dysphagia and he ordered a barium swallow which showed only reflux. 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 She has seen gastroenterologists and had EGDs and colonoscopies in [place] but we do not have records and she does not recall when the last one was, probably greater than 5 years ago. She was found to have heme-positive stools here but denies any melena. She has mild intermittent fleeting lower abdominal pain. She is not on any blood thinners and denies NSAIDs.  Past Medical History:  Diagnosis Date  . Aortic stenosis    moderate  . CKD (chronic kidney disease) stage 3, GFR 30-59 ml/min   . Depression   . DM (diabetes mellitus), type 2 ([place])   . Gout   . HTN (hypertension)   . Hypercholesterolemia   . Hypothyroidism   . Iron deficiency anemia   . Mitral regurgitation    Mild    Past Surgical History:  Procedure Laterality Date  . BACK SURGERY  1984  . CATARACT EXTRACTION, BILATERAL  1997  . CHOLECYSTECTOMY  2001  . GASTRIC BYPASS  1999  . KNEE SURGERY Left 1994  . THORACENTESIS Right    in [place], [place]  . TONSILLECTOMY      Medications Prior to Admission  Medication Sig Dispense Refill  . allopurinol (ZYLOPRIM) 100 MG tablet Take 100 mg by mouth 2 (two) times daily.     . busPIRone (BUSPAR) 10 MG tablet Take 10 mg by mouth every evening.     . calcium-vitamin D (OSCAL WITH D) 500-200 MG-UNIT tablet Take 1 tablet by mouth every morning.     . cetirizine (ZYRTEC) 10 MG  tablet Take 10 mg by mouth every morning.     . Cinnamon 500 MG TABS Take 1,000 mg by mouth every morning.     . diphenhydrAMINE (BENADRYL) 25 MG tablet Take 25 mg by mouth at bedtime.    . docusate sodium (COLACE) 100 MG capsule Take 100 mg by mouth daily as needed for mild constipation or moderate constipation.     . folic acid (FOLVITE) 1 MG tablet Take 1 mg by mouth every morning.     Marland Kitchen glucosamine-chondroitin 500-400 MG tablet Take 1 tablet by mouth every morning.     . Lactobacillus (ACIDOPHILUS) 100 MG CAPS Take 100 mg by mouth every morning.     Marland Kitchen levothyroxine (SYNTHROID, LEVOTHROID) 88 MCG tablet Take 88 mcg by mouth every morning.  1  . magnesium gluconate (MAGONATE) 500 MG tablet Take 500 mg by mouth 2 (two) times daily.    . polyethylene glycol (MIRALAX / GLYCOLAX) packet Take 17 g by mouth every morning.     . simvastatin (ZOCOR) 20 MG tablet Take 20 mg by mouth every evening.     . torsemide (DEMADEX) 20 MG tablet Take 10 mg by mouth 3 (three) times daily.     . traZODone (DESYREL) 50 MG tablet Take 50 mg by mouth at bedtime.    . Turmeric 500 MG  TABS Take 500 mg by mouth every morning.     . venlafaxine (EFFEXOR) 75 MG tablet Take 75 mg by mouth 2 (two) times daily.    Marland Kitchen HYDROcodone-acetaminophen (NORCO/VICODIN) 5-325 MG tablet Take 1 tablet by mouth every 6 (six) hours as needed for severe pain. (Patient not taking: Reported on 10/16/2016) 15 tablet 0    Allergies: No Known Allergies  Family History  Problem Relation Age of Onset  . Breast cancer Mother   . Colon cancer Father   . Prostate cancer Father   . Emphysema Father     Social History:  reports that she has never smoked. She has never used smokeless tobacco. She reports that she does not drink alcohol or use drugs.  Review of Systems: negative except As above   Blood pressure (!) 100/55, pulse 88, temperature 98.6 F (37 C), temperature source Oral, resp. rate 16, height _0  (1.575 m), weight 71.7 kg (158 lb  1.1 oz), SpO2 95 %. Head: Normocephalic, without obvious abnormality, atraumatic Neck: no adenopathy, no carotid bruit, no JVD, supple, symmetrical, trachea midline and thyroid not enlarged, symmetric, no tenderness/mass/nodules Resp: clear to auscultation bilaterally Cardio: regular rate and rhythm, S1, S2 normal, no murmur, click, rub or gallop GI: Abdomen soft nondistended with normoactive bowel sounds. No hepatosplenomegaly mass or guarding. Extremities: extremities normal, atraumatic, no cyanosis or edema  Results for orders placed or performed during the hospital encounter of 10/16/16 (from the past 48 hour(s))  CBC with Differential     Status: Abnormal   Collection Time: 10/16/16  4:40 PM  Result Value Ref Range   WBC 6.1 4.0 - 10.5 K/uL   RBC 2.73 (L) 3.87 - 5.11 MIL/uL   Hemoglobin 7.9 (L) 12.0 - 15.0 g/dL   HCT 24.8 (L) 36.0 - 46.0 %   MCV 90.8 78.0 - 100.0 fL   MCH 28.9 26.0 - 34.0 pg   MCHC 31.9 30.0 - 36.0 g/dL   RDW 16.4 (H) 11.5 - 15.5 %   Platelets 194 150 - 400 K/uL   Neutrophils Relative % 68 %   Neutro Abs 4.1 1.7 - 7.7 K/uL   Lymphocytes Relative 18 %   Lymphs Abs 1.1 0.7 - 4.0 K/uL   Monocytes Relative 10 %   Monocytes Absolute 0.6 0.1 - 1.0 K/uL   Eosinophils Relative 4 %   Eosinophils Absolute 0.3 0.0 - 0.7 K/uL   Basophils Relative 0 %   Basophils Absolute 0.0 0.0 - 0.1 K/uL  Comprehensive metabolic panel     Status: Abnormal   Collection Time: 10/16/16  4:40 PM  Result Value Ref Range   Sodium 139 135 - 145 mmol/L   Potassium 4.1 3.5 - 5.1 mmol/L   Chloride 102 101 - 111 mmol/L   CO2 26 22 - 32 mmol/L   Glucose, Bld 130 (H) 65 - 99 mg/dL   BUN 51 (H) 6 - 20 mg/dL   Creatinine, Ser 1.62 (H) 0.44 - 1.00 mg/dL   Calcium 8.5 (L) 8.9 - 10.3 mg/dL   Total Protein 7.0 6.5 - 8.1 g/dL   Albumin 3.5 3.5 - 5.0 g/dL   AST 35 15 - 41 U/L   ALT 18 14 - 54 U/L   Alkaline Phosphatase 88 38 - 126 U/L   Total Bilirubin 0.8 0.3 - 1.2 mg/dL   GFR calc non Af Amer  28 (L) >60 mL/min   GFR calc Af Amer 32 (L) >60 mL/min    Comment: (NOTE) The  eGFR has been calculated using the CKD EPI equation. This calculation has not been validated in all clinical situations. eGFR's persistently <60 mL/min signify possible Chronic Kidney Disease.    Anion gap 11 5 - 15  Occult blood card to lab, stool     Status: Abnormal   Collection Time: 10/16/16  6:00 PM  Result Value Ref Range   Fecal Occult Bld POSITIVE (A) NEGATIVE  Comprehensive metabolic panel     Status: Abnormal   Collection Time: 10/17/16  5:53 AM  Result Value Ref Range   Sodium 141 135 - 145 mmol/L   Potassium 3.6 3.5 - 5.1 mmol/L   Chloride 104 101 - 111 mmol/L   CO2 29 22 - 32 mmol/L   Glucose, Bld 112 (H) 65 - 99 mg/dL   BUN 45 (H) 6 - 20 mg/dL   Creatinine, Ser 1.37 (H) 0.44 - 1.00 mg/dL   Calcium 8.5 (L) 8.9 - 10.3 mg/dL   Total Protein 6.2 (L) 6.5 - 8.1 g/dL   Albumin 3.1 (L) 3.5 - 5.0 g/dL   AST 28 15 - 41 U/L   ALT 15 14 - 54 U/L   Alkaline Phosphatase 72 38 - 126 U/L   Total Bilirubin 0.7 0.3 - 1.2 mg/dL   GFR calc non Af Amer 34 (L) >60 mL/min   GFR calc Af Amer 39 (L) >60 mL/min    Comment: (NOTE) The eGFR has been calculated using the CKD EPI equation. This calculation has not been validated in all clinical situations. eGFR's persistently <60 mL/min signify possible Chronic Kidney Disease.    Anion gap 8 5 - 15  CBC     Status: Abnormal   Collection Time: 10/17/16  5:53 AM  Result Value Ref Range   WBC 5.3 4.0 - 10.5 K/uL   RBC 2.55 (L) 3.87 - 5.11 MIL/uL   Hemoglobin 7.4 (L) 12.0 - 15.0 g/dL   HCT 22.9 (L) 36.0 - 46.0 %   MCV 89.8 78.0 - 100.0 fL   MCH 29.0 26.0 - 34.0 pg   MCHC 32.3 30.0 - 36.0 g/dL   RDW 16.8 (H) 11.5 - 15.5 %   Platelets 194 150 - 400 K/uL  Ferritin     Status: None   Collection Time: 10/17/16  5:53 AM  Result Value Ref Range   Ferritin 23 11 - 307 ng/mL    Comment: Performed at [facility], [place] [address], [place], [place] [address]   Iron and TIBC     Status: Abnormal   Collection Time: 10/17/16  5:53 AM  Result Value Ref Range   Iron 24 (L) 28 - 170 ug/dL   TIBC 413 250 - 450 ug/dL   Saturation Ratios 6 (L) 10.4 - 31.8 %   UIBC 389 ug/dL    Comment: Performed at [facility], [place] [address], [place], [place] [address]  Vitamin B12     Status: None   Collection Time: 10/17/16  5:53 AM  Result Value Ref Range   Vitamin B-12 292 180 - 914 pg/mL    Comment: (NOTE) This assay is not validated for testing neonatal or myeloproliferative syndrome specimens for Vitamin B12 levels. Performed at [facility], [place] [address], [place], [place] [address]   Prepare RBC     Status: None   Collection Time: 10/17/16  1:04 PM  Result Value Ref Range   Order Confirmation ORDER PROCESSED BY BLOOD BANK   ABO/Rh  Status: None   Collection Time: 10/17/16  2:20 PM  Result Value Ref Range   ABO/RH(D) B POS   Type and screen [place]     Status: None (Preliminary result)   Collection Time: 10/17/16  2:21 PM  Result Value Ref Range   ABO/RH(D) B POS    Antibody Screen NEG    Sample Expiration 10/20/2016    Unit Number [id number]    Blood Component Type RED CELLS,LR    Unit division 00    Status of Unit ISSUED    Transfusion Status OK TO TRANSFUSE    Crossmatch Result Compatible   Basic metabolic panel     Status: Abnormal   Collection Time: 10/18/16  5:12 AM  Result Value Ref Range   Sodium 140 135 - 145 mmol/L   Potassium 4.1 3.5 - 5.1 mmol/L   Chloride 106 101 - 111 mmol/L   CO2 27 22 - 32 mmol/L   Glucose, Bld 116 (H) 65 - 99 mg/dL   BUN 42 (H) 6 - 20 mg/dL   Creatinine, Ser 1.25 (H) 0.44 - 1.00 mg/dL   Calcium 8.3 (L) 8.9 - 10.3 mg/dL   GFR calc non Af Amer 38 (L) >60 mL/min   GFR calc Af Amer 44 (L) >60 mL/min    Comment: (NOTE) The eGFR has been calculated using the CKD EPI equation. This calculation has not been validated in all clinical situations. eGFR's persistently  <60 mL/min signify possible Chronic Kidney Disease.    Anion gap 7 5 - 15  CBC with Differential/Platelet     Status: Abnormal   Collection Time: 10/18/16  5:12 AM  Result Value Ref Range   WBC 5.9 4.0 - 10.5 K/uL   RBC 2.77 (L) 3.87 - 5.11 MIL/uL   Hemoglobin 7.9 (L) 12.0 - 15.0 g/dL   HCT 25.9 (L) 36.0 - 46.0 %   MCV 93.5 78.0 - 100.0 fL   MCH 28.5 26.0 - 34.0 pg   MCHC 30.5 30.0 - 36.0 g/dL   RDW 16.1 (H) 11.5 - 15.5 %   Platelets 182 150 - 400 K/uL   Neutrophils Relative % 68 %   Neutro Abs 4.0 1.7 - 7.7 K/uL   Lymphocytes Relative 17 %   Lymphs Abs 1.0 0.7 - 4.0 K/uL   Monocytes Relative 11 %   Monocytes Absolute 0.7 0.1 - 1.0 K/uL   Eosinophils Relative 4 %   Eosinophils Absolute 0.2 0.0 - 0.7 K/uL   Basophils Relative 0 %   Basophils Absolute 0.0 0.0 - 0.1 K/uL   X-ray Chest Pa And Lateral  Result Date: 10/17/2016 CLINICAL DATA:  Dyspnea.  History of hypertension and diabetes. EXAM: CHEST  2 VIEW COMPARISON:  None. FINDINGS: Cardiac silhouette is mildly enlarged. Mildly calcified aortic knob. Diffuse interstitial prominence with small RIGHT pleural effusion. No pneumothorax. Osteopenia. Surgical clips in the included right abdomen compatible with cholecystectomy. IMPRESSION: Mild cardiomegaly and diffuse interstitial prominence favoring pulmonary edema. Small RIGHT pleural effusion. Electronically Signed   By: Elon Alas M.D.   On: 10/17/2016 01:58   Ct Chest Wo Contrast  Result Date: 10/17/2016 CLINICAL DATA:  83 year old female status post fracture with right hip region hematoma. Shortness of breath. EXAM: CT CHEST WITHOUT CONTRAST TECHNIQUE: Multidetector CT imaging of the chest was performed following the standard protocol without IV contrast. COMPARISON:  Chest radiographs 10/16/2016 FINDINGS: Cardiovascular: Calcified aortic and coronary artery atherosclerosis. Evidence of prior mitral valve repair. Mild cardiomegaly. No pericardial  effusion. Central pulmonary  artery enlargement, estimated at up to 30 mm diameter of each main pulmonary artery. Mediastinum/Nodes: Upper limits of normal right peritracheal and precarinal lymph nodes, nonspecific. No definite hilar lymphadenopathy in the absence of IV contrast. Lungs/Pleura: Major airways are patent. Small layering left pleural effusion, about 2 cm in thickness. Trace layering right pleural effusion. Mild architectural distortion in the right upper lobe. Confluent partially calcified posterior basal segment lower lobe peribronchial opacity (series 2, image 98), measuring about 19 mm. Superimposed lateral basal segment right lower lobe calcified granuloma. Increased bilateral lower lobe, middle lobe, and right upper lobe subpleural reticular opacity. Upper Abdomen: Small to moderate volume free fluid in the upper abdomen. Liver volume appears decreased over that expected, questionable mildly nodular liver contour. Surgically absent gallbladder. Partially visible previous gastric bypass. Retained stool to splenic flexure. Negative visible noncontrast adrenal glands and kidneys. Musculoskeletal: Flowing osteophytes in the thoracic spine. No acute osseous abnormality identified. IMPRESSION: 1. Cardiomegaly, calcified aortic and coronary artery atherosclerosis, and enlarged central pulmonary arteries suggesting a degree of pulmonary artery hypertension. 2. Small layering left and trace right pleural effusions. 3. Chronic lung changes including some architectural distortion in the right lung and right greater than left subpleural reticular opacity which might reflect a degree of fibrosis or chronic fibrothorax. 4. Nonspecific confluent peribronchial opacity in the right lower lobe, but partially calcified. Favor chronic round atelectasis over acute bronchopneumonia. 5. Partially visible abdominal ascites.  Possible liver cirrhosis. 6. Prior gastric bypass. Electronically Signed   By: Genevie Ann M.D.   On: 10/17/2016 09:58     Assessment: Heme positive stool with 2 half gram drop in hemoglobin over 4 months History of gastric bypass Plan:  Will proceed with EGD. Await records from [place] will decide whether colonoscopy is warranted Gazella Anglin C 10/18/2016, 10:48 AM  Pager [phone] If no answer or after 5 PM call [phone]

## 2016-10-18 NOTE — Interval H&P Note (Signed)
History and Physical Interval Note:  10/18/2016 10:33 AM  Madison Riggs  has presented today for surgery, with the diagnosis of anemia, heme positive stool  The various methods of treatment have been discussed with the patient and family. After consideration of risks, benefits and other options for treatment, the patient has consented to  Procedure(s): ESOPHAGOGASTRODUODENOSCOPY (EGD) (N/A) as a surgical intervention .  The patient's history has been reviewed, patient examined, no change in status, stable for surgery.  I have reviewed the patient's chart and labs.  Questions were answered to the patient's satisfaction.     Candus Braud C

## 2016-10-19 ENCOUNTER — Observation Stay (HOSPITAL_COMMUNITY): Payer: Medicare Other

## 2016-10-19 ENCOUNTER — Encounter (HOSPITAL_COMMUNITY): Payer: Self-pay | Admitting: Gastroenterology

## 2016-10-19 DIAGNOSIS — S300XXS Contusion of lower back and pelvis, sequela: Secondary | ICD-10-CM | POA: Diagnosis not present

## 2016-10-19 DIAGNOSIS — D508 Other iron deficiency anemias: Secondary | ICD-10-CM | POA: Diagnosis not present

## 2016-10-19 DIAGNOSIS — I10 Essential (primary) hypertension: Secondary | ICD-10-CM | POA: Diagnosis not present

## 2016-10-19 DIAGNOSIS — E118 Type 2 diabetes mellitus with unspecified complications: Secondary | ICD-10-CM | POA: Diagnosis not present

## 2016-10-19 LAB — BASIC METABOLIC PANEL
Anion gap: 7 (ref 5–15)
BUN: 39 mg/dL — AB (ref 6–20)
CALCIUM: 8.5 mg/dL — AB (ref 8.9–10.3)
CO2: 28 mmol/L (ref 22–32)
CREATININE: 1.31 mg/dL — AB (ref 0.44–1.00)
Chloride: 105 mmol/L (ref 101–111)
GFR calc Af Amer: 41 mL/min — ABNORMAL LOW (ref >60)
GFR, EST NON AFRICAN AMERICAN: 36 mL/min — AB (ref >60)
GLUCOSE: 98 mg/dL (ref 65–99)
Potassium: 3.9 mmol/L (ref 3.5–5.1)
SODIUM: 140 mmol/L (ref 135–145)

## 2016-10-19 LAB — CBC
HEMATOCRIT: 27.7 % — AB (ref 36.0–46.0)
Hemoglobin: 8.9 g/dL — ABNORMAL LOW (ref 12.0–15.0)
MCH: 28.9 pg (ref 26.0–34.0)
MCHC: 32.1 g/dL (ref 30.0–36.0)
MCV: 89.9 fL (ref 78.0–100.0)
PLATELETS: 174 10*3/uL (ref 150–400)
RBC: 3.08 MIL/uL — ABNORMAL LOW (ref 3.87–5.11)
RDW: 16.4 % — AB (ref 11.5–15.5)
WBC: 5.2 10*3/uL (ref 4.0–10.5)

## 2016-10-19 LAB — TYPE AND SCREEN
ABO/RH(D): B POS
ANTIBODY SCREEN: NEGATIVE
UNIT DIVISION: 0
Unit division: 0

## 2016-10-19 LAB — BPAM RBC
Blood Product Expiration Date: 201806052359
Blood Product Expiration Date: 201806052359
ISSUE DATE / TIME: 201805051620
ISSUE DATE / TIME: 201805061615
UNIT TYPE AND RH: 7300
Unit Type and Rh: 7300

## 2016-10-19 LAB — FOLATE RBC
FOLATE, HEMOLYSATE: 569.7 ng/mL
Folate, RBC: 2435 ng/mL (ref >498)
Hematocrit: 23.4 % — ABNORMAL LOW (ref 34.0–46.6)

## 2016-10-19 LAB — GLIADIN ANTIBODIES, SERUM
GLIADIN IGA: 4 U (ref 0–19)
GLIADIN IGG: 2 U (ref 0–19)

## 2016-10-19 MED ORDER — SENNOSIDES-DOCUSATE SODIUM 8.6-50 MG PO TABS
1.0000 | ORAL_TABLET | Freq: Two times a day (BID) | ORAL | Status: DC
  Administered 2016-10-19: 1 via ORAL
  Filled 2016-10-19: qty 1

## 2016-10-19 MED ORDER — TORSEMIDE 20 MG PO TABS: 20.0000 mg | ORAL_TABLET | Freq: Two times a day (BID) | ORAL | Status: DC

## 2016-10-19 MED ORDER — SENNOSIDES-DOCUSATE SODIUM 8.6-50 MG PO TABS: 1.0000 | ORAL_TABLET | Freq: Two times a day (BID) | ORAL | 0 refills | Status: AC

## 2016-10-19 MED ORDER — TORSEMIDE 20 MG PO TABS
20.0000 mg | ORAL_TABLET | Freq: Two times a day (BID) | ORAL | Status: DC
  Filled 2016-10-19: qty 1

## 2016-10-19 MED ORDER — FERROUS SULFATE 325 (65 FE) MG PO TABS: 325.0000 mg | ORAL_TABLET | Freq: Three times a day (TID) | ORAL | 0 refills | Status: DC

## 2016-10-19 MED ORDER — PANTOPRAZOLE SODIUM 40 MG PO TBEC: 40.0000 mg | DELAYED_RELEASE_TABLET | Freq: Two times a day (BID) | ORAL | 0 refills | Status: AC

## 2016-10-19 MED ORDER — POLYETHYLENE GLYCOL 3350 17 G PO PACK: 17.0000 g | PACK | Freq: Every day | ORAL | Status: DC | PRN

## 2016-10-19 NOTE — Care Management Note (Signed)
Case Management Note  Patient Details  Name: Madison Riggs MRN: [id number] Date of Birth: 01/19/1928  Subjective/Objective:  83 y/o f admitted w/Anemia. From home. Active w/home 02 HS. PT recc HHPT. Provided w/HHC agency provider list for choice-dtr n law/son chose [facility] able to accept. No further CM needs.                  Action/Plan:d/c home w/HHC.   Expected Discharge Date:                  Expected Discharge Plan:  [place]  In-House Referral:     Discharge planning Services  CM Consult  Post Acute Care Choice:    Choice offered to:  Patient, Adult Children  DME Arranged:    DME Agency:     HH Arranged:  PT [place]:  [place]  Status of Service:  Completed, signed off  If discussed at [place] of Stay Meetings, dates discussed:    Additional Comments:  Dessa Phi, RN 10/19/2016, 12:33 PM

## 2016-10-19 NOTE — Progress Notes (Signed)
hgb 7.9 -> 8.9 after 1 u prc's yest (appropriate rise)  Pt ok from GI standpoint for dischg today.  Recomm:  1. PO iron will have limited effectiveness in this pt b/o bypass status and high-dose PPI therapy; therefore, would rely primarily on IV iron to replete iron stores following dischg  2. Pt was on omepr 20 mg qd PTA, but in view of documented esophagitis on that regimen, would favor dischg on either omepr 40 bid or pantoprazole 40 bid  3. Have recomm'd to pt and dtr-in-law to f/u w/ Dr. Paulita Fujita in 2-8 wks post dischg, at which time they can discuss whether f/u egd to confirm healing of esophagitis is appropriate  4. Will sign off, call us prn  Cleotis Nipper, M.D. Pager [phone] If no answer or after 5 PM call [phone]

## 2016-10-19 NOTE — Progress Notes (Signed)
Physical Therapy Treatment Patient Details Name: Madison Riggs MRN: [id number] DOB: [birth year] Today's Date: 10/19/2016    History of Present Illness  Hillari Zumwalt  is a 83 y.o. female adm with anemia; hx of  CKD stage3, Hypertension, Hyperlipidemia, Anemia, back surgery, recent fall (10/11/16)    PT Comments    Pt c/o mild R hip pain from "where I fell".  Assisted OOb to amb a greater distance with rollator walker.   Follow Up Recommendations  Home health PT;Supervision for mobility/OOB     Equipment Recommendations  None recommended by PT    Recommendations for Other Services       Precautions / Restrictions Precautions Precautions: Fall    Mobility  Bed Mobility Overal bed mobility: Needs Assistance Bed Mobility: Supine to Sit;Sit to Supine     Supine to sit: Supervision;Min guard Sit to supine: Supervision;Min guard   General bed mobility comments: for trunk to upright, incr itme  Transfers Overall transfer level: Needs assistance Equipment used: 4-wheeled walker Transfers: Sit to/from Stand Sit to Stand: Supervision;Min guard         General transfer comment: cues for safety  Ambulation/Gait Ambulation/Gait assistance: Supervision;Min guard Ambulation Distance (Feet): 145 Feet Assistive device: 4-wheeled walker Gait Pattern/deviations: Step-through pattern;Decreased stride length Gait velocity: WFL   General Gait Details: used rollator walker which pt uses at home.  Tolerated an increased distance.     Stairs            Wheelchair Mobility    Modified Rankin (Stroke Patients Only)       Balance                                            Cognition Arousal/Alertness: Awake/alert Behavior During Therapy: WFL for tasks assessed/performed Overall Cognitive Status: Within Functional Limits for tasks assessed                                        Exercises      General Comments         Pertinent Vitals/Pain Pain Assessment: No/denies pain    Home Living                      Prior Function            PT Goals (current goals can now be found in the care plan section) Progress towards PT goals: Progressing toward goals    Frequency    Min 3X/week      PT Plan      Co-evaluation              AM-PAC PT "6 Clicks" Daily Activity  Outcome Measure  Difficulty turning over in bed (including adjusting bedclothes, sheets and blankets)?: A Little Difficulty moving from lying on back to sitting on the side of the bed? : A Little Difficulty sitting down on and standing up from a chair with arms (e.g., wheelchair, bedside commode, etc,.)?: A Little Help needed moving to and from a bed to chair (including a wheelchair)?: A Little Help needed walking in hospital room?: A Little Help needed climbing 3-5 steps with a railing? : A Little 6 Click Score: 18    End of Session Equipment Utilized During Treatment: Gait belt Activity Tolerance:  Patient tolerated treatment well Patient left: with call bell/phone within reach;with family/visitor present;in bed Nurse Communication: Mobility status PT Visit Diagnosis: Unsteadiness on feet (R26.81)     Time: 1000-1025 PT Time Calculation (min) (ACUTE ONLY): 25 min  Charges:  $Gait Training: 8-22 mins $Therapeutic Activity: 8-22 mins                    G Codes:       {Kadia Abaya  PTA [facility]  Acute  Rehab Pager      [phone]

## 2016-10-19 NOTE — Progress Notes (Signed)
Patient is stable for discharge. Discharge instructions and medications have been reviewed with the patient and her family at the bedside, and all questions answered.    Madison Riggs [facility] 10/19/2016 1:08 PM

## 2016-10-19 NOTE — Care Management Obs Status (Signed)
[place] NOTIFICATION   Patient Details  Name: Madison Riggs MRN: [id number] Date of Birth: 1927-09-13   Medicare Observation Status Notification Given:  Yes    MahabirJuliann Pulse, RN 10/19/2016, 12:37 PM

## 2016-10-19 NOTE — Discharge Summary (Signed)
Physician Discharge Summary  ITZAE MCCURDY  SFK:[id number]  DOB: [birth year]  DOA: 10/16/2016 PCP: Corine Shelter, PA-C  Admit date: 10/16/2016 Discharge date: 10/19/2016  Admitted From: Home  Disposition:  Home  Recommendations for Outpatient Follow-up:  1. Follow up with PCP in 1-2 weeks 2. Please obtain BMP/CBC in one week 3. Recommend treatment with IV iron infusion as outpatient   Home Health: PT   Discharge Condition: Stable  CODE STATUS: FULL  Diet recommendation: Heart Healthy   Brief/Interim Summary: 83 year old female with past medical history of chronic kidney disease stage III, hypertension, hyperlipidemia and anemia who was sent by PCP due to anemia. Patient was elevated in the ED and found her hemoglobin was 7.9 with Hemoccult positive and was admitted for GI evaluation. Patient was transfused total of 2 units during hospital stay and IV iron was infused as well.   Subjective: Patient seen and examined, feeling much better. Still c/o mild pain at r gluteal area where there is a hematoma.   Discharge Diagnoses/Hospital Course:  Symptomatic anemia, acute on chronic blood loss. Severe Iron deficiency.  s/p total of 2 units of PRBC's during hospital stay  Patient received iron trasnfusion x 2  Endoscopy revealed reflux esophagitis without active bleeding Continue ferrous sulfate 3 times a day with Senokot daily Hgb stable - GI cleared for discharge  Recommend to treat with iron infusions as outpatient given possible poor absorption of oral iron due to past gastric bypass   GERD with esophagitis  PPI 40 mg BID  Follow up with Dr. Paulita Fujita in 2-8 weeks   Acute on Chronic diastolic HF - Clinically improved  Initial CXR concern for pulm edema, on exam crackles at the base  Repeat CXR no significant changes, although clinically has improved, No crackles on exam and no LE edema.  ECHO normal EF, G2DD Torsemide changed to 20 mg BID  Low salt diet advised  Daily weights    CKD stage III - Stable  Check Cr in 1 week   DM type 2 - CBG's stable  No changes in medications where made  Monitor CBG's goal < 140 fasting   HTN - stable  Continue home medications   R gluteal hematoma due to fall  Alternated heat with ICE  Tylenol PRN pain  Patient was advised that will take about 6-8 weeks to resolve, patient verbalizes understanding   All other chronic medical condition were stable during the hospitalization.  Patient was seen by physical therapy, recommending HH PT  On the day of the discharge the patient's vitals were stable, and no other acute medical condition were reported by patient. Patient was felt safe to be discharge to home   Discharge Instructions  You were cared for by a hospitalist during your hospital stay. If you have any questions about your discharge medications or the care you received while you were in the hospital after you are discharged, you can call the unit and asked to speak with the hospitalist on call if the hospitalist that took care of you is not available. Once you are discharged, your primary care physician will handle any further medical issues. Please note that NO REFILLS for any discharge medications will be authorized once you are discharged, as it is imperative that you return to your primary care physician (or establish a relationship with a primary care physician if you do not have one) for your aftercare needs so that they can reassess your need for medications and monitor your lab  values.  Discharge Instructions    Call MD for:  difficulty breathing, headache or visual disturbances    Complete by:  As directed    Call MD for:  extreme fatigue    Complete by:  As directed    Call MD for:  hives    Complete by:  As directed    Call MD for:  persistant dizziness or light-headedness    Complete by:  As directed    Call MD for:  persistant nausea and vomiting    Complete by:  As directed    Call MD for:  redness,  tenderness, or signs of infection (pain, swelling, redness, odor or green/yellow discharge around incision site)    Complete by:  As directed    Call MD for:  severe uncontrolled pain    Complete by:  As directed    Call MD for:  temperature >100.4    Complete by:  As directed    Diet - low sodium heart healthy    Complete by:  As directed    Face-to-face encounter (required for Medicare/Medicaid patients)    Complete by:  As directed    The encounter with the patient was in whole, or in part, for the following medical condition, which is the primary reason for home health care:  Symptomatic anemia and CHF   I certify that, based on my findings, the following services are medically necessary home health services:  Physical therapy   Reason for Medically Necessary Home Health Services:  Therapy- Personnel officer, Training and development officer and Stair Training   My clinical findings support the need for the above services:  Shortness of breath with activity   Further, I certify that my clinical findings support that this patient is homebound due to:  Shortness of Breath with activity   Increase activity slowly    Complete by:  As directed      Allergies as of 10/19/2016   No Known Allergies     Medication List    STOP taking these medications   busPIRone 10 MG tablet Commonly known as:  BUSPAR   diphenhydrAMINE 25 MG tablet Commonly known as:  BENADRYL   docusate sodium 100 MG capsule Commonly known as:  COLACE   HYDROcodone-acetaminophen 5-325 MG tablet Commonly known as:  NORCO/VICODIN     TAKE these medications   Acidophilus 100 MG Caps Take 100 mg by mouth every morning.   allopurinol 100 MG tablet Commonly known as:  ZYLOPRIM Take 100 mg by mouth 2 (two) times daily.   calcium-vitamin D 500-200 MG-UNIT tablet Commonly known as:  OSCAL WITH D Take 1 tablet by mouth every morning.   cetirizine 10 MG tablet Commonly known as:  ZYRTEC Take 10 mg by mouth every morning.    Cinnamon 500 MG Tabs Take 1,000 mg by mouth every morning.   ferrous sulfate 325 (65 FE) MG tablet Take 1 tablet (325 mg total) by mouth 3 (three) times daily with meals.   folic acid 1 MG tablet Commonly known as:  FOLVITE Take 1 mg by mouth every morning.   glucosamine-chondroitin 500-400 MG tablet Take 1 tablet by mouth every morning.   levothyroxine 88 MCG tablet Commonly known as:  SYNTHROID, LEVOTHROID Take 88 mcg by mouth every morning.   magnesium gluconate 500 MG tablet Commonly known as:  MAGONATE Take 500 mg by mouth 2 (two) times daily.   pantoprazole 40 MG tablet Commonly known as:  PROTONIX Take 1 tablet (40 mg total)  by mouth 2 (two) times daily.   polyethylene glycol packet Commonly known as:  MIRALAX / GLYCOLAX Take 17 g by mouth every morning.   senna-docusate 8.6-50 MG tablet Commonly known as:  Senokot-S Take 1 tablet by mouth 2 (two) times daily.   simvastatin 20 MG tablet Commonly known as:  ZOCOR Take 20 mg by mouth every evening.   torsemide 20 MG tablet Commonly known as:  DEMADEX Take 1 tablet (20 mg total) by mouth 2 (two) times daily. What changed:  how much to take  when to take this   traZODone 50 MG tablet Commonly known as:  DESYREL Take 50 mg by mouth at bedtime.   Turmeric 500 MG Tabs Take 500 mg by mouth every morning.   venlafaxine 75 MG tablet Commonly known as:  EFFEXOR Take 75 mg by mouth 2 (two) times daily.      Follow-up Information    Corine Shelter, PA-C. Schedule an appointment as soon as possible for a visit in 1 week(s).   Specialty:  Physician Assistant Contact information: [address] [place] [address] [phone]        Arta Silence, MD. Schedule an appointment as soon as possible for a visit in 2 week(s).   Specialty:  Gastroenterology Contact information: [address] [place] [address] [phone]        [place], [place] Follow up.    Specialty:  Home Health Services Why:  [facility] physical therapy Contact information: [place] [address] [phone]          No Known Allergies  Consultations:  GI   Procedures/Studies: X-ray Chest Pa And Lateral  Result Date: 10/17/2016 CLINICAL DATA:  Dyspnea.  History of hypertension and diabetes. EXAM: CHEST  2 VIEW COMPARISON:  None. FINDINGS: Cardiac silhouette is mildly enlarged. Mildly calcified aortic knob. Diffuse interstitial prominence with small RIGHT pleural effusion. No pneumothorax. Osteopenia. Surgical clips in the included right abdomen compatible with cholecystectomy. IMPRESSION: Mild cardiomegaly and diffuse interstitial prominence favoring pulmonary edema. Small RIGHT pleural effusion. Electronically Signed   By: Elon Alas M.D.   On: 10/17/2016 01:58   Ct Chest Wo Contrast  Result Date: 10/17/2016 CLINICAL DATA:  83 year old female status post fracture with right hip region hematoma. Shortness of breath. EXAM: CT CHEST WITHOUT CONTRAST TECHNIQUE: Multidetector CT imaging of the chest was performed following the standard protocol without IV contrast. COMPARISON:  Chest radiographs 10/16/2016 FINDINGS: Cardiovascular: Calcified aortic and coronary artery atherosclerosis. Evidence of prior mitral valve repair. Mild cardiomegaly. No pericardial effusion. Central pulmonary artery enlargement, estimated at up to 30 mm diameter of each main pulmonary artery. Mediastinum/Nodes: Upper limits of normal right peritracheal and precarinal lymph nodes, nonspecific. No definite hilar lymphadenopathy in the absence of IV contrast. Lungs/Pleura: Major airways are patent. Small layering left pleural effusion, about 2 cm in thickness. Trace layering right pleural effusion. Mild architectural distortion in the right upper lobe. Confluent partially calcified posterior basal segment lower lobe peribronchial opacity (series 2, image 98), measuring about 19  mm. Superimposed lateral basal segment right lower lobe calcified granuloma. Increased bilateral lower lobe, middle lobe, and right upper lobe subpleural reticular opacity. Upper Abdomen: Small to moderate volume free fluid in the upper abdomen. Liver volume appears decreased over that expected, questionable mildly nodular liver contour. Surgically absent gallbladder. Partially visible previous gastric bypass. Retained stool to splenic flexure. Negative visible noncontrast adrenal glands and kidneys. Musculoskeletal: Flowing  osteophytes in the thoracic spine. No acute osseous abnormality identified. IMPRESSION: 1. Cardiomegaly, calcified aortic and coronary artery atherosclerosis, and enlarged central pulmonary arteries suggesting a degree of pulmonary artery hypertension. 2. Small layering left and trace right pleural effusions. 3. Chronic lung changes including some architectural distortion in the right lung and right greater than left subpleural reticular opacity which might reflect a degree of fibrosis or chronic fibrothorax. 4. Nonspecific confluent peribronchial opacity in the right lower lobe, but partially calcified. Favor chronic round atelectasis over acute bronchopneumonia. 5. Partially visible abdominal ascites.  Possible liver cirrhosis. 6. Prior gastric bypass. Electronically Signed   By: Genevie Ann M.D.   On: 10/17/2016 09:58   Dg Esophagus  Result Date: 10/07/2016 CLINICAL DATA:  Dysphagia EXAM: ESOPHOGRAM/BARIUM SWALLOW TECHNIQUE: Single contrast examination was performed using  thin barium. FLUOROSCOPY TIME:  Fluoroscopy Time:  1 minutes 30 seconds Radiation Exposure Index (if provided by the fluoroscopic device): 141 mGy Number of Acquired Spot Images: 0 COMPARISON:  None. FINDINGS: Initially rapid sequence spot films of the cervical esophagus were obtained in AP and lateral projections. The cervical esophageal is unremarkable and the swallowing mechanism appears normal. Esophageal peristalsis is  within normal limits. The patient has previously undergone gastric bypass and the appearance of the gastroesophageal junction most likely is postsurgical. However, there is moderate gastroesophageal reflux demonstrated. A barium pill was given at the end the study which passed into the stomach without delay. IMPRESSION: 1. Mild to moderate gastroesophageal reflux.  No hiatal hernia. 2. Barium pill passes into the stomach without delay. Electronically Signed   By: Ivar Drape M.D.   On: 10/07/2016 10:41   Ct Hip Right Wo Contrast  Result Date: 10/11/2016 CLINICAL DATA:  Recent fall with right hip pain, initial encounter EXAM: CT OF THE RIGHT HIP WITHOUT CONTRAST TECHNIQUE: Multidetector CT imaging of the right hip was performed according to the standard protocol. Multiplanar CT image reconstructions were also generated. COMPARISON:  None. FINDINGS: Bones/Joint/Cartilage Mild degenerative changes of the lumbosacral junction as well as the sacroiliac joints are seen. There are mild degenerative changes of the right hip joint as well. No acute fracture or dislocation is seen. Ligaments Suboptimally assessed by CT. Muscles and Tendons A right gluteal hematoma is identified with a hematocrit level within. It measures approximately 5 cm in greatest dimension. Soft tissues Changes of anasarca are noted. Ascites is noted within the abdomen incompletely evaluated by this exam. IMPRESSION: No acute hip fracture is identified. Degenerative changes are noted as described. Right gluteal hematoma. Ascites and changes of anasarca. Electronically Signed   By: Inez Catalina M.D.   On: 10/11/2016 19:07   Dg Chest Port 1 View  Result Date: 10/19/2016 CLINICAL DATA:  Shortness of breath. EXAM: PORTABLE CHEST 1 VIEW COMPARISON:  Chest radiographs 10/16/2016 and CT 10/17/2016 FINDINGS: The cardiac silhouette remains mildly enlarged. Aortic atherosclerosis is again noted. There is right-sided pleural thickening and trace pleural  fluid as seen on CT. Pulmonary vascular congestion and mild diffuse interstitial prominence are similar to the prior radiographs. No pneumothorax is seen. No acute osseous abnormality is identified. IMPRESSION: Unchanged cardiomegaly and pulmonary vascular congestion. Small right pleural effusion. Electronically Signed   By: Logan Bores M.D.   On: 10/19/2016 07:17     Discharge Exam: Vitals:   10/18/16 1937 10/19/16 0544  BP: 114/62 (!) 112/56  Pulse: 80 83  Resp: 16 16  Temp: 98.2 F (36.8 C) 98.7 F (37.1 C)   Vitals:  10/18/16 1624 10/18/16 1644 10/18/16 1937 10/19/16 0544  BP: 116/85 118/80 114/62 (!) 112/56  Pulse: 82 85 80 83  Resp: 16 16 16 16   Temp: 98.4 F (36.9 C) 98.8 F (37.1 C) 98.2 F (36.8 C) 98.7 F (37.1 C)  TempSrc: Oral Oral Oral Oral  SpO2: 95% 95% 99% 97%  Weight:    72.7 kg (160 lb 4.4 oz)  Height:        General: NAD, Frail elderly woman  Cardiovascular: S1S2 RRR Respiratory: CTA no wheezing  Abdominal: Soft, NT, ND, bowel sounds + Extremities: no edema, no cyanosis   The results of significant diagnostics from this hospitalization (including imaging, microbiology, ancillary and laboratory) are listed below for reference.     Labs: Basic Metabolic Panel:  Recent Labs Lab 10/16/16 1640 10/17/16 0553 10/18/16 0512 10/19/16 0523  NA 139 141 140 140  K 4.1 3.6 4.1 3.9  CL 102 104 106 105  CO2 26 29 27 28   GLUCOSE 130* 112* 116* 98  BUN 51* 45* 42* 39*  CREATININE 1.62* 1.37* 1.25* 1.31*  CALCIUM 8.5* 8.5* 8.3* 8.5*   Liver Function Tests:  Recent Labs Lab 10/16/16 1640 10/17/16 0553  AST 35 28  ALT 18 15  ALKPHOS 88 72  BILITOT 0.8 0.7  PROT 7.0 6.2*  ALBUMIN 3.5 3.1*   CBC:  Recent Labs Lab 10/16/16 1640 10/17/16 0553 10/18/16 0512 10/19/16 0523  WBC 6.1 5.3 5.9 5.2  NEUTROABS 4.1  --  4.0  --   HGB 7.9* 7.4* 7.9* 8.9*  HCT 24.8* 22.9* 25.9* 27.7*  MCV 90.8 89.8 93.5 89.9  PLT 194 194 182 174   Anemia work  up  Recent Labs  10/17/16 0553  VITAMINB12 292  FERRITIN 23  TIBC 413  IRON 24*    Time coordinating discharge: 40 minutes  SIGNED:  Chipper Oman, MD  [facility] 10/19/2016, 1:52 PM  Pager please text page via  [email or web address] Password [email or web address]

## 2016-10-20 ENCOUNTER — Telehealth: Payer: Self-pay | Admitting: *Deleted

## 2016-10-20 LAB — TISSUE TRANSGLUTAMINASE, IGA

## 2016-10-20 NOTE — Telephone Encounter (Signed)
NOTES SENT TO SCHEDULING.  °

## 2016-10-21 LAB — RETICULIN ANTIBODIES, IGA W TITER: Reticulin Ab, IgA: NEGATIVE titer (ref <2.5)

## 2016-10-22 ENCOUNTER — Other Ambulatory Visit: Payer: Self-pay

## 2016-10-22 ENCOUNTER — Ambulatory Visit: Payer: Medicare Other | Admitting: Cardiology

## 2016-10-28 NOTE — Progress Notes (Signed)
Cardiology Office Note   Date:  10/30/2016   ID:  Madison Riggs, DOB 1927/11/22, MRN [id number]  PCP:  Corine Shelter, PA-C  Cardiologist:   Minus Breeding, MD  Referring:  Corine Shelter, PA-C  Chief Complaint  Patient presents with  . Shortness of Breath      History of Present Illness: Madison Riggs is a 83 y.o. female who presents for evaluation of diastolic heart failure. He was hospitalized recently with anemia. She required transfusion although I don't see that there was acute bleed identified. She did have some pulmonary edema on chest x-ray. Her torsemide was decreased at discharge. She was diuresed in the hospital. She does have stage III renal insufficiency which is to be followed. An echocardiogram demonstrated well-preserved ejection fraction. There was evidence of diastolic dysfunction. There was some moderate tricuspid regurgitation. There was mild aortic valve stenosis.    She presents as a new patient.  She actually does fairly well. She watches her fluid and her salt. She weighs herself daily.  She denies any PND or orthopnea. She has no chest pressure, neck or arm discomfort. She's had no weight gain. This fluctuates really within 1 pound. She denies any chest pressure, neck or arm discomfort.  Of note she did have ascites which was incidentally noted on chest CT   Past Medical History:  Diagnosis Date  . Aortic stenosis    moderate  . CKD (chronic kidney disease) stage 3, GFR 30-59 ml/min   . Depression   . DM (diabetes mellitus), type 2 ([place])   . Gout   . HTN (hypertension)   . Hypercholesterolemia   . Hypothyroidism   . Iron deficiency anemia     Past Surgical History:  Procedure Laterality Date  . BACK SURGERY  1984  . CATARACT EXTRACTION, BILATERAL  1997  . CHOLECYSTECTOMY  2001  . ESOPHAGOGASTRODUODENOSCOPY N/A 10/18/2016   Procedure: ESOPHAGOGASTRODUODENOSCOPY (EGD);  Surgeon: Teena Irani, MD;  Location: [facility];  Service: Endoscopy;   Laterality: N/A;  . GASTRIC BYPASS  1999  . KNEE SURGERY Left 1994  . THORACENTESIS Right    in [place], [place]  . TONSILLECTOMY       Current Outpatient Prescriptions  Medication Sig Dispense Refill  . allopurinol (ZYLOPRIM) 100 MG tablet Take 100 mg by mouth 2 (two) times daily.     . calcium-vitamin D (OSCAL WITH D) 500-200 MG-UNIT tablet Take 1 tablet by mouth every morning.     . cetirizine (ZYRTEC) 10 MG tablet Take 10 mg by mouth every morning.     . Cinnamon 500 MG TABS Take 1,000 mg by mouth every morning.     . ferrous sulfate 325 (65 FE) MG tablet Take 1 tablet (325 mg total) by mouth 3 (three) times daily with meals. 90 tablet 0  . folic acid (FOLVITE) 1 MG tablet Take 1 mg by mouth every morning.     Marland Kitchen glucosamine-chondroitin 500-400 MG tablet Take 1 tablet by mouth every morning.     . Lactobacillus (ACIDOPHILUS) 100 MG CAPS Take 100 mg by mouth every morning.     Marland Kitchen levothyroxine (SYNTHROID, LEVOTHROID) 88 MCG tablet Take 88 mcg by mouth every morning.  1  . magnesium gluconate (MAGONATE) 500 MG tablet Take 500 mg by mouth 2 (two) times daily.    . pantoprazole (PROTONIX) 40 MG tablet Take 1 tablet (40 mg total) by mouth 2 (two) times daily. 60 tablet 0  . polyethylene glycol (MIRALAX / GLYCOLAX)  packet Take 17 g by mouth every morning.     . senna-docusate (SENOKOT-S) 8.6-50 MG tablet Take 1 tablet by mouth 2 (two) times daily. 60 tablet 0  . simvastatin (ZOCOR) 20 MG tablet Take 20 mg by mouth every evening.     . torsemide (DEMADEX) 20 MG tablet Take 1 tablet (20 mg total) by mouth 2 (two) times daily.    . traZODone (DESYREL) 50 MG tablet Take 50 mg by mouth at bedtime.    . Turmeric 500 MG TABS Take 500 mg by mouth every morning.     . venlafaxine (EFFEXOR) 75 MG tablet Take 75 mg by mouth 2 (two) times daily.     No current facility-administered medications for this visit.     Allergies:   Patient has no known allergies.    Social History:  The patient  reports  that she has never smoked. She has never used smokeless tobacco. She reports that she does not drink alcohol or use drugs.   Family History:  The patient's family history includes Breast cancer in her mother; Colon cancer in her father; Emphysema in her father; Prostate cancer in her father.    ROS:  Please see the history of present illness.   Otherwise, review of systems are positive for none.   All other systems are reviewed and negative.    PHYSICAL EXAM: VS:  BP (!) 112/58   Pulse 88   Ht 5\' 2"  (1.575 m)   Wt 148 lb 3.2 oz (67.2 kg)   BMI 27.11 kg/m  , BMI Body mass index is 27.11 kg/m. GENERAL:  Somewhat frail appearing HEENT:  Pupils equal round and reactive, fundi not visualized, oral mucosa unremarkable NECK:  No jugular venous distention, waveform within normal limits, carotid upstroke brisk and symmetric, no bruits, no thyromegaly LYMPHATICS:  No cervical, inguinal adenopathy LUNGS:  Clear to auscultation bilaterally BACK:  No CVA tenderness CHEST:  Unremarkable HEART:  PMI not displaced or sustained,S1 and S2 within normal limits, no S3, no S4, no clicks, no rubs, 2 out of 6 apical systolic murmur radiating out the aortic outflow tract, no diastolic murmurs ABD:  Flat, positive bowel sounds normal in frequency in pitch, no bruits, no rebound, no guarding, no midline pulsatile mass, no hepatomegaly, no splenomegaly EXT:  2 plus pulses throughout, moderate leg edema, no cyanosis no clubbing SKIN:  No rashes no nodules NEURO:  Cranial nerves II through XII grossly intact, motor grossly intact throughout PSYCH:  Cognitively intact, oriented to person place and time    EKG:  EKG is ordered today. The ekg ordered today demonstrates sinus rhythm, rate 88, low voltage throughout, no acute ST-T wave changes, no old EKGs for comparison.   Recent Labs: 10/17/2016: ALT 15 10/19/2016: BUN 39; Creatinine, Ser 1.31; Hemoglobin 8.9; Platelets 174; Potassium 3.9; Sodium 140    Lipid  Panel No results found for: CHOL, TRIG, HDL, CHOLHDL, VLDL, LDLCALC, LDLDIRECT    Wt Readings from Last 3 Encounters:  10/29/16 148 lb 3.2 oz (67.2 kg)  10/19/16 160 lb 4.4 oz (72.7 kg)  10/11/16 148 lb (67.1 kg)      Other studies Reviewed: Additional studies/ records that were reviewed today include: hospital records. Review of the above records demonstrates:  Please see elsewhere in the note.     ASSESSMENT AND PLAN:   AORTIC STENOSIS:  This is mild to moderate and can be followed clinically.    CHRONIC DIASTOLIC HF:  She seems to be  euvolemic.  She is very good about salt and fluid restriction and daily weights. At this point I don't see a change in her medical regimen that is indicated.  CKD II - III:  Her creat two days ago was stable.  I reviewed labs from [place].  Follow per Corine Shelter, PA-C  ABNORMAL EKG:  She has very low voltage but she didn't have any significant effusion on her chest x-ray. She may have some infiltrative process but I like to see old records and an old EKG in particular. Regardless I think this will be managed conservatively.  Current medicines are reviewed at length with the patient today.  The patient does not have concerns regarding medicines.  The following changes have been made:  no change  Labs/ tests ordered today include: None  Orders Placed This Encounter  Procedures  . EKG 12-Lead     Disposition:   FU with APP in 3 months and me six months after that if she is stable.     Signed, Minus Breeding, MD  10/30/2016 4:12 AM    [place]

## 2016-10-29 ENCOUNTER — Ambulatory Visit (INDEPENDENT_AMBULATORY_CARE_PROVIDER_SITE_OTHER): Payer: Medicare Other | Admitting: Cardiology

## 2016-10-29 ENCOUNTER — Encounter: Payer: Self-pay | Admitting: Cardiology

## 2016-10-29 VITALS — BP 112/58 | HR 88 | Ht 62.0 in | Wt 148.2 lb

## 2016-10-29 DIAGNOSIS — I350 Nonrheumatic aortic (valve) stenosis: Secondary | ICD-10-CM

## 2016-10-29 DIAGNOSIS — I5032 Chronic diastolic (congestive) heart failure: Secondary | ICD-10-CM | POA: Diagnosis not present

## 2016-10-29 DIAGNOSIS — R9431 Abnormal electrocardiogram [ECG] [EKG]: Secondary | ICD-10-CM

## 2016-10-29 DIAGNOSIS — I10 Essential (primary) hypertension: Secondary | ICD-10-CM | POA: Diagnosis not present

## 2016-10-29 NOTE — Patient Instructions (Signed)
Medication Instructions:  Continue current medicaitons  Labwork: None Ordered  Testing/Procedures: None Ordered  Follow-Up: Your physician recommends that you schedule a follow-up appointment in: 3 Months with Rosaria Ferries    Any Other Special Instructions Will Be Listed Below (If Applicable).  Mediterranean Diet  If you need a refill on your cardiac medications before your next appointment, please call your pharmacy.

## 2016-10-30 ENCOUNTER — Encounter: Payer: Self-pay | Admitting: Cardiology

## 2016-10-30 DIAGNOSIS — I5032 Chronic diastolic (congestive) heart failure: Secondary | ICD-10-CM | POA: Insufficient documentation

## 2016-10-30 DIAGNOSIS — I350 Nonrheumatic aortic (valve) stenosis: Secondary | ICD-10-CM | POA: Insufficient documentation

## 2016-10-30 DIAGNOSIS — R9431 Abnormal electrocardiogram [ECG] [EKG]: Secondary | ICD-10-CM | POA: Insufficient documentation

## 2016-11-02 ENCOUNTER — Ambulatory Visit (HOSPITAL_BASED_OUTPATIENT_CLINIC_OR_DEPARTMENT_OTHER): Payer: Medicare Other | Admitting: Hematology

## 2016-11-02 ENCOUNTER — Telehealth: Payer: Self-pay | Admitting: Hematology

## 2016-11-02 ENCOUNTER — Ambulatory Visit (HOSPITAL_BASED_OUTPATIENT_CLINIC_OR_DEPARTMENT_OTHER): Payer: Medicare Other

## 2016-11-02 ENCOUNTER — Encounter: Payer: Self-pay | Admitting: Hematology

## 2016-11-02 VITALS — BP 99/46 | HR 82 | Temp 98.2°F | Resp 17 | Ht 62.0 in | Wt 156.9 lb

## 2016-11-02 DIAGNOSIS — W19XXXS Unspecified fall, sequela: Secondary | ICD-10-CM | POA: Diagnosis not present

## 2016-11-02 DIAGNOSIS — K909 Intestinal malabsorption, unspecified: Secondary | ICD-10-CM

## 2016-11-02 DIAGNOSIS — N189 Chronic kidney disease, unspecified: Secondary | ICD-10-CM | POA: Diagnosis not present

## 2016-11-02 DIAGNOSIS — S300XXS Contusion of lower back and pelvis, sequela: Secondary | ICD-10-CM | POA: Diagnosis not present

## 2016-11-02 DIAGNOSIS — E538 Deficiency of other specified B group vitamins: Secondary | ICD-10-CM

## 2016-11-02 DIAGNOSIS — D649 Anemia, unspecified: Secondary | ICD-10-CM

## 2016-11-02 DIAGNOSIS — Z9884 Bariatric surgery status: Secondary | ICD-10-CM | POA: Diagnosis not present

## 2016-11-02 DIAGNOSIS — D631 Anemia in chronic kidney disease: Secondary | ICD-10-CM | POA: Diagnosis not present

## 2016-11-02 DIAGNOSIS — K922 Gastrointestinal hemorrhage, unspecified: Secondary | ICD-10-CM | POA: Diagnosis not present

## 2016-11-02 DIAGNOSIS — D500 Iron deficiency anemia secondary to blood loss (chronic): Secondary | ICD-10-CM | POA: Diagnosis not present

## 2016-11-02 LAB — COMPREHENSIVE METABOLIC PANEL
ALBUMIN: 3.5 g/dL (ref 3.5–5.0)
ALK PHOS: 116 U/L (ref 40–150)
ALT: 26 U/L (ref 0–55)
ANION GAP: 9 meq/L (ref 3–11)
AST: 54 U/L — ABNORMAL HIGH (ref 5–34)
BILIRUBIN TOTAL: 0.6 mg/dL (ref 0.20–1.20)
BUN: 33.6 mg/dL — ABNORMAL HIGH (ref 7.0–26.0)
CO2: 29 mEq/L (ref 22–29)
CREATININE: 1.5 mg/dL — AB (ref 0.6–1.1)
Calcium: 9.1 mg/dL (ref 8.4–10.4)
Chloride: 106 mEq/L (ref 98–109)
EGFR: 32 mL/min/{1.73_m2} — AB (ref >90)
Glucose: 102 mg/dl (ref 70–140)
Potassium: 3.4 mEq/L — ABNORMAL LOW (ref 3.5–5.1)
Sodium: 143 mEq/L (ref 136–145)
TOTAL PROTEIN: 7.2 g/dL (ref 6.4–8.3)

## 2016-11-02 LAB — CBC & DIFF AND RETIC
BASO%: 0.4 % (ref 0.0–2.0)
BASOS ABS: 0 10*3/uL (ref 0.0–0.1)
EOS ABS: 0.4 10*3/uL (ref 0.0–0.5)
EOS%: 6.5 % (ref 0.0–7.0)
HEMATOCRIT: 33.6 % — AB (ref 34.8–46.6)
HEMOGLOBIN: 10.5 g/dL — AB (ref 11.6–15.9)
IMMATURE RETIC FRACT: 5 % (ref 1.60–10.00)
LYMPH%: 19.3 % (ref 14.0–49.7)
MCH: 30.3 pg (ref 25.1–34.0)
MCHC: 31.3 g/dL — ABNORMAL LOW (ref 31.5–36.0)
MCV: 97.1 fL (ref 79.5–101.0)
MONO#: 0.5 10*3/uL (ref 0.1–0.9)
MONO%: 10 % (ref 0.0–14.0)
NEUT#: 3.5 10*3/uL (ref 1.5–6.5)
NEUT%: 63.8 % (ref 38.4–76.8)
PLATELETS: 207 10*3/uL (ref 145–400)
RBC: 3.46 10*6/uL — ABNORMAL LOW (ref 3.70–5.45)
RDW: 21.1 % — ABNORMAL HIGH (ref 11.2–14.5)
Retic %: 1.76 % (ref 0.70–2.10)
Retic Ct Abs: 60.9 10*3/uL (ref 33.70–90.70)
WBC: 5.4 10*3/uL (ref 3.9–10.3)
lymph#: 1 10*3/uL (ref 0.9–3.3)
nRBC: 0 % (ref 0–0)

## 2016-11-02 NOTE — Patient Instructions (Signed)
Thank you for choosing [facility] to provide your oncology and hematology care.  To afford each patient quality time with our providers, please arrive 30 minutes before your scheduled appointment time.  If you arrive late for your appointment, you may be asked to reschedule.  We strive to give you quality time with our providers, and arriving late affects you and other patients whose appointments are after yours.   If you are a no show for multiple scheduled visits, you may be dismissed from the clinic at the providers discretion.    Again, thank you for choosing [facility], our hope is that these requests will decrease the amount of time that you wait before being seen by our physicians.  ______________________________________________________________________  Should you have questions after your visit to the  Cancer Center, please contact our office at [phone] between the hours of 8:30 and 4:30 p.m.    Voicemails left after 4:30p.m will not be returned until the following business day.    For prescription refill requests, please have your pharmacy contact us directly.  Please also try to allow 48 hours for prescription requests.    Please contact the scheduling department for questions regarding scheduling.  For scheduling of procedures such as PET scans, CT scans, MRI, Ultrasound, etc please contact central scheduling at [phone].    Resources For Cancer Patients and Caregivers:   Oncolink.org:  A wonderful resource for patients and healthcare providers for information regarding your disease, ways to tract your treatment, what to expect, etc.     American Cancer Society:  800-227-2345  Can help patients locate various types of support and financial assistance  Cancer Care: 1-800-813-HOPE (4673) Provides financial assistance, online support groups, medication/co-pay assistance.    [place] DSS:  336-641-3447 Where to apply for food  stamps, Medicaid, and utility assistance  Medicare Rights Center: 800-333-4114 Helps people with Medicare understand their rights and benefits, navigate the Medicare system, and secure the quality healthcare they deserve  SCAT: 336-333-6589 [place] [employer]'s shared-ride transportation service for eligible riders who have a disability that prevents them from riding the fixed route bus.    For additional information on assistance programs please contact our social worker:   Grier Hock/Abigail Elmore:  [phone]            

## 2016-11-02 NOTE — Telephone Encounter (Signed)
Gave patient AVS and calender per 5/21 los.  

## 2016-11-02 NOTE — Progress Notes (Signed)
.    HEMATOLOGY/ONCOLOGY CONSULTATION NOTE  Date of Service: 11/02/2016  Patient Care Team: Hepler, Mark, PA-C as PCP - General (Physician Assistant)/Stephen Meyer MD  CHIEF COMPLAINTS/PURPOSE OF CONSULTATION:  Anemia  HISTORY OF PRESENTING ILLNESS:   Madison Riggs is a wonderful 83 y.o. female who has been referred to us by Dr .Hepler, Mark, PA-C for evaluation and management of Anemia.  Patient has multiple medical comorbidities including hypertension, dyslipidemia, hypothyroidism, diabetes, chronic kidney disease and iron deficiency anemia, Roux-en-Y gastric bypass surgery in 1999. She was recently admitted to the hospital after she was noted to have a hemoglobin of 7.7 with her primary care physician in early May 2018. She was admitted to the hospital on 10/16/2016 and was noted to have a hemoglobin of 7.9 with the positive Hemoccult test.  She has also recently had a fall on her right hip with a hematoma.  Labs showed a ferritin level of 23 with an iron saturation of 6% low normal B12 of 292 normal RBC folate. Celiac panel was apparently negative. She received 2 units of PRBCs and IV iron. She had a GI consultation and an endoscopy by Dr. John Hayes which showed LA Grade D (one or more mucosal breaks involving at least 75% of esophageal circumference) esophagitis with no bleeding was found 21 to 38 cm from the incisors.  Hemoglobin on discharge was 8.9. She was discharged with a follow-up with gastroenterology Dr. Outlaw possible additional GI workup. She was started on ferrous sulfate 1 tablet by mouth 3 times a day which she notes is causing a fair amount of constipation.  She has been sent to us for further management of her anemia and consideration for additional IV iron.  Patient notes that her stools have turned dark sent she has been on oral iron replacement. Notes no abdominal pain. No weight loss. No nausea or vomiting.   She has chronic kidney disease with a baseline  creatinine of 1.2-1.5. And hypothyroidism on levothyroxine replacement.  MEDICAL HISTORY:  Past Medical History:  Diagnosis Date  . Aortic stenosis    moderate  . CKD (chronic kidney disease) stage 3, GFR 30-59 ml/min   . Depression   . DM (diabetes mellitus), type 2 (HCC)   . Gout   . HTN (hypertension)   . Hypercholesterolemia   . Hypothyroidism   . Iron deficiency anemia     SURGICAL HISTORY: Past Surgical History:  Procedure Laterality Date  . BACK SURGERY  1984  . CATARACT EXTRACTION, BILATERAL  1997  . CHOLECYSTECTOMY  2001  . ESOPHAGOGASTRODUODENOSCOPY N/A 10/18/2016   Procedure: ESOPHAGOGASTRODUODENOSCOPY (EGD);  Surgeon: Hayes, John, MD;  Location: [facility];  Service: Endoscopy;  Laterality: N/A;  . GASTRIC BYPASS  1999  . KNEE SURGERY Left 1994  . THORACENTESIS Right    in [place], [place]  . TONSILLECTOMY      SOCIAL HISTORY: Social History   Social History  . Marital status: Widowed    Spouse name: N/A  . Number of children: N/A  . Years of education: N/A   Occupational History  . Not on file.   Social History Main Topics  . Smoking status: Never Smoker  . Smokeless tobacco: Never Used  . Alcohol use No  . Drug use: No  . Sexual activity: Not on file   Other Topics Concern  . Not on file   Social History Narrative   Widowed   Lives with son and daughter-in-law   Moved here from [place]   03/25/2016   2 sons   Occupation: retired, was a Museum/gallery conservator with [employer]    FAMILY HISTORY: Family History  Problem Relation Age of Onset  . Breast cancer Mother   . Colon cancer Father   . Prostate cancer Father   . Emphysema Father     ALLERGIES:  has No Known Allergies.  MEDICATIONS:  Current Outpatient Prescriptions  Medication Sig Dispense Refill  . allopurinol (ZYLOPRIM) 100 MG tablet Take 100 mg by mouth 2 (two) times daily.     . calcium-vitamin D (OSCAL WITH D) 500-200 MG-UNIT tablet Take 1 tablet by mouth every morning.       . cetirizine (ZYRTEC) 10 MG tablet Take 10 mg by mouth every morning.     . Cinnamon 500 MG TABS Take 1,000 mg by mouth every morning.     . ferrous sulfate 325 (65 FE) MG tablet Take 1 tablet (325 mg total) by mouth 3 (three) times daily with meals. 90 tablet 0  . folic acid (FOLVITE) 1 MG tablet Take 1 mg by mouth every morning.     Marland Kitchen glucosamine-chondroitin 500-400 MG tablet Take 1 tablet by mouth every morning.     . Lactobacillus (ACIDOPHILUS) 100 MG CAPS Take 100 mg by mouth every morning.     Marland Kitchen levothyroxine (SYNTHROID, LEVOTHROID) 88 MCG tablet Take 88 mcg by mouth every morning.  1  . magnesium gluconate (MAGONATE) 500 MG tablet Take 500 mg by mouth as needed.     . pantoprazole (PROTONIX) 40 MG tablet Take 1 tablet (40 mg total) by mouth 2 (two) times daily. 60 tablet 0  . polyethylene glycol (MIRALAX / GLYCOLAX) packet Take 17 g by mouth every morning.     . senna-docusate (SENOKOT-S) 8.6-50 MG tablet Take 1 tablet by mouth 2 (two) times daily. 60 tablet 0  . simvastatin (ZOCOR) 20 MG tablet Take 20 mg by mouth every evening.     . torsemide (DEMADEX) 20 MG tablet Take 1 tablet (20 mg total) by mouth 2 (two) times daily.    . traZODone (DESYREL) 50 MG tablet Take 50 mg by mouth at bedtime.    . Turmeric 500 MG TABS Take 500 mg by mouth every morning.     . venlafaxine (EFFEXOR) 75 MG tablet Take 75 mg by mouth 2 (two) times daily.     No current facility-administered medications for this visit.     REVIEW OF SYSTEMS:    10 Point review of Systems was done is negative except as noted above.  PHYSICAL EXAMINATION: ECOG PERFORMANCE STATUS: 2 - Symptomatic, <50% confined to bed  . Vitals:   11/02/16 1404  BP: (!) 99/46  Pulse: 82  Resp: 17  Temp: 98.2 F (36.8 C)   Filed Weights   11/02/16 1404  Weight: 156 lb 14.4 oz (71.2 kg)   .Body mass index is 28.7 kg/m.  GENERAL:alert, in no acute distress and comfortable SKIN: no acute rashes, no significant lesions EYES:  conjunctiva are pink and non-injected, sclera anicteric OROPHARYNX: MMM, no exudates, no oropharyngeal erythema or ulceration NECK: supple, no JVD LYMPH:  no palpable lymphadenopathy in the cervical, axillary or inguinal regions LUNGS: clear to auscultation b/l with normal respiratory effort HEART: regular rate & rhythm ABDOMEN:  normoactive bowel sounds , non tender, not distended. Extremity: no pedal edema PSYCH: alert & oriented x 3 with fluent speech NEURO: no focal motor/sensory deficits  LABORATORY DATA:  I have reviewed the data as listed  .  CBC Latest Ref Rng & Units 11/02/2016 11/02/2016 10/19/2016  WBC 3.9 - 10.3 10e3/uL 5.4 - 5.2  Hemoglobin 11.6 - 15.9 g/dL 10.5(L) - 8.9(L)  Hematocrit 34.0 - 46.6 % 33.6(L) 33.4(L) 27.7(L)  Platelets 145 - 400 10e3/uL 207 - 174    . CMP Latest Ref Rng & Units 11/02/2016 10/19/2016 10/18/2016  Glucose 70 - 140 mg/dl 102 98 116(H)  BUN 7.0 - 26.0 mg/dL 33.6(H) 39(H) 42(H)  Creatinine 0.6 - 1.1 mg/dL 1.5(H) 1.31(H) 1.25(H)  Sodium 136 - 145 mEq/L 143 140 140  Potassium 3.5 - 5.1 mEq/L 3.4(L) 3.9 4.1  Chloride 101 - 111 mmol/L - 105 106  CO2 22 - 29 mEq/L _0 Calcium 8.4 - 10.4 mg/dL 9.1 8.5(L) 8.3(L)  Total Protein 6.4 - 8.3 g/dL 7.2 - -  Total Bilirubin 0.20 - 1.20 mg/dL 0.60 - -  Alkaline Phos 40 - 150 U/L 116 - -  AST 5 - 34 U/L 54(H) - -  ALT 0 - 55 U/L 26 - -   . Lab Results  Component Value Date   IRON 63 11/02/2016   TIBC 361 11/02/2016   IRONPCTSAT 18 (L) 11/02/2016   (Iron and TIBC)  Lab Results  Component Value Date   FERRITIN 446 (H) 11/02/2016      RADIOGRAPHIC STUDIES: I have personally reviewed the radiological images as listed and agreed with the findings in the report. X-ray Chest Pa And Lateral  Result Date: 10/17/2016 CLINICAL DATA:  Dyspnea.  History of hypertension and diabetes. EXAM: CHEST  2 VIEW COMPARISON:  None. FINDINGS: Cardiac silhouette is mildly enlarged. Mildly calcified aortic knob.  Diffuse interstitial prominence with small RIGHT pleural effusion. No pneumothorax. Osteopenia. Surgical clips in the included right abdomen compatible with cholecystectomy. IMPRESSION: Mild cardiomegaly and diffuse interstitial prominence favoring pulmonary edema. Small RIGHT pleural effusion. Electronically Signed   By: Elon Alas M.D.   On: 10/17/2016 01:58   Ct Chest Wo Contrast  Result Date: 10/17/2016 CLINICAL DATA:  83 year old female status post fracture with right hip region hematoma. Shortness of breath. EXAM: CT CHEST WITHOUT CONTRAST TECHNIQUE: Multidetector CT imaging of the chest was performed following the standard protocol without IV contrast. COMPARISON:  Chest radiographs 10/16/2016 FINDINGS: Cardiovascular: Calcified aortic and coronary artery atherosclerosis. Evidence of prior mitral valve repair. Mild cardiomegaly. No pericardial effusion. Central pulmonary artery enlargement, estimated at up to 30 mm diameter of each main pulmonary artery. Mediastinum/Nodes: Upper limits of normal right peritracheal and precarinal lymph nodes, nonspecific. No definite hilar lymphadenopathy in the absence of IV contrast. Lungs/Pleura: Major airways are patent. Small layering left pleural effusion, about 2 cm in thickness. Trace layering right pleural effusion. Mild architectural distortion in the right upper lobe. Confluent partially calcified posterior basal segment lower lobe peribronchial opacity (series 2, image 98), measuring about 19 mm. Superimposed lateral basal segment right lower lobe calcified granuloma. Increased bilateral lower lobe, middle lobe, and right upper lobe subpleural reticular opacity. Upper Abdomen: Small to moderate volume free fluid in the upper abdomen. Liver volume appears decreased over that expected, questionable mildly nodular liver contour. Surgically absent gallbladder. Partially visible previous gastric bypass. Retained stool to splenic flexure. Negative visible  noncontrast adrenal glands and kidneys. Musculoskeletal: Flowing osteophytes in the thoracic spine. No acute osseous abnormality identified. IMPRESSION: 1. Cardiomegaly, calcified aortic and coronary artery atherosclerosis, and enlarged central pulmonary arteries suggesting a degree of pulmonary artery hypertension. 2. Small layering left and trace right pleural effusions. 3. Chronic lung changes  including some architectural distortion in the right lung and right greater than left subpleural reticular opacity which might reflect a degree of fibrosis or chronic fibrothorax. 4. Nonspecific confluent peribronchial opacity in the right lower lobe, but partially calcified. Favor chronic round atelectasis over acute bronchopneumonia. 5. Partially visible abdominal ascites.  Possible liver cirrhosis. 6. Prior gastric bypass. Electronically Signed   By: H  Hall M.D.   On: 10/17/2016 09:58   Dg Esophagus  Result Date: 10/07/2016 CLINICAL DATA:  Dysphagia EXAM: ESOPHOGRAM/BARIUM SWALLOW TECHNIQUE: Single contrast examination was performed using  thin barium. FLUOROSCOPY TIME:  Fluoroscopy Time:  1 minutes 30 seconds Radiation Exposure Index (if provided by the fluoroscopic device): 141 mGy Number of Acquired Spot Images: 0 COMPARISON:  None. FINDINGS: Initially rapid sequence spot films of the cervical esophagus were obtained in AP and lateral projections. The cervical esophageal is unremarkable and the swallowing mechanism appears normal. Esophageal peristalsis is within normal limits. The patient has previously undergone gastric bypass and the appearance of the gastroesophageal junction most likely is postsurgical. However, there is moderate gastroesophageal reflux demonstrated. A barium pill was given at the end the study which passed into the stomach without delay. IMPRESSION: 1. Mild to moderate gastroesophageal reflux.  No hiatal hernia. 2. Barium pill passes into the stomach without delay. Electronically Signed    By: Paul  Barry M.D.   On: 10/07/2016 10:41   Ct Hip Right Wo Contrast  Result Date: 10/11/2016 CLINICAL DATA:  Recent fall with right hip pain, initial encounter EXAM: CT OF THE RIGHT HIP WITHOUT CONTRAST TECHNIQUE: Multidetector CT imaging of the right hip was performed according to the standard protocol. Multiplanar CT image reconstructions were also generated. COMPARISON:  None. FINDINGS: Bones/Joint/Cartilage Mild degenerative changes of the lumbosacral junction as well as the sacroiliac joints are seen. There are mild degenerative changes of the right hip joint as well. No acute fracture or dislocation is seen. Ligaments Suboptimally assessed by CT. Muscles and Tendons A right gluteal hematoma is identified with a hematocrit level within. It measures approximately 5 cm in greatest dimension. Soft tissues Changes of anasarca are noted. Ascites is noted within the abdomen incompletely evaluated by this exam. IMPRESSION: No acute hip fracture is identified. Degenerative changes are noted as described. Right gluteal hematoma. Ascites and changes of anasarca. Electronically Signed   By: Mark  Lukens M.D.   On: 10/11/2016 19:07   Dg Chest Port 1 View  Result Date: 10/19/2016 CLINICAL DATA:  Shortness of breath. EXAM: PORTABLE CHEST 1 VIEW COMPARISON:  Chest radiographs 10/16/2016 and CT 10/17/2016 FINDINGS: The cardiac silhouette remains mildly enlarged. Aortic atherosclerosis is again noted. There is right-sided pleural thickening and trace pleural fluid as seen on CT. Pulmonary vascular congestion and mild diffuse interstitial prominence are similar to the prior radiographs. No pneumothorax is seen. No acute osseous abnormality is identified. IMPRESSION: Unchanged cardiomegaly and pulmonary vascular congestion. Small right pleural effusion. Electronically Signed   By: Allen  Grady M.D.   On: 10/19/2016 07:17    ASSESSMENT & PLAN:   83-year-old female with  #1 Multifactorial Anemia This appears to be  primarily related to iron deficiency from GI bleeding plus poor absorption due to her Roux-en-Y gastric bypass surgery. Also has an acute element of blood loss due to a 5 cm right gluteal hematoma from trauma due to a fall. Element of anemia due to her chronic kidney disease. Cannot rule out some low level MDS though this does not appear to be limiting currently.    recent CT scan also suggests the possibility of liver cirrhosis.   Patient's hemoglobin on labs today has improved to 10.5 from her recent discharge hemoglobin of 8.9. She has received 2 units of PRBCs and IV iron and her ferritin currently is in the 400s. She will also probably resorb her hematoma. B12 levels are normal at 1326. RBC folate within normal limits  PLAN -No overt indication for additional IV iron currently. -Good cutdown her ferrous sulfate 1 tablet by mouth twice a day for maintenance. -Would need IV iron when necessary to maintain her ferritin level more than 100 in the setting of chronic kidney disease. -On PPI twice a day for her severe esophagitis which could be a source of chronic blood loss. -She is following up with Dr. Paulita Fujita for additional gastroenterology workup as indicated. -Reasonable to take a daily vitamin B complex to support accelerated erythropoiesis. -We shall follow up on pending labs including myeloma panel. -Further evaluation for possible liver cirrhosis as per primary care physician. -No indication for bone marrow biopsy at this time.  #2 . Patient Active Problem List   Diagnosis Date Noted  . Chronic diastolic HF (heart failure) ([place]) 10/30/2016  . Abnormal EKG 10/30/2016  . Nonrheumatic aortic valve stenosis 10/30/2016  . Anemia 10/16/2016  . Acute renal failure (ARF) ([place]) 10/16/2016  . Hypertension 10/16/2016  . Type II diabetes mellitus with manifestations ([place]) 10/16/2016  . Dyspnea and respiratory abnormalities 06/02/2016   -Follow-up with primary care physician for management  of other medical comorbidities  Return to care with Dr. Irene Limbo in 2 months with repeat labs   All of the patients questions were answered with apparent satisfaction. The patient knows to call the clinic with any problems, questions or concerns.  I spent 40 minutes counseling the patient face to face. The total time spent in the appointment was 60 minutes and more than 50% was on counseling and direct patient cares.    Sullivan Lone MD [place] AAHIVMS [facility] Hematology/Oncology Physician [facility]  (Office):       [phone] (Work cell):  [phone] (Fax):           [phone]  11/02/2016 2:36 PM

## 2016-11-03 LAB — FERRITIN: Ferritin: 446 ng/ml — ABNORMAL HIGH (ref 9–269)

## 2016-11-03 LAB — VITAMIN B12: Vitamin B12: 1326 pg/mL — ABNORMAL HIGH (ref 232–1245)

## 2016-11-03 LAB — FOLATE RBC
Folate, Hemolysate: 489.5 ng/mL
Hematocrit: 33.4 % — ABNORMAL LOW (ref 34.0–46.6)

## 2016-11-03 LAB — IRON AND TIBC
%SAT: 18 % — ABNORMAL LOW (ref 21–57)
Iron: 63 ug/dL (ref 41–142)
TIBC: 361 ug/dL (ref 236–444)
UIBC: 298 ug/dL (ref 120–384)

## 2016-11-04 ENCOUNTER — Telehealth: Payer: Self-pay | Admitting: *Deleted

## 2016-11-04 NOTE — Telephone Encounter (Signed)
-----   Message from Brunetta Genera, MD sent at 11/04/2016 11:24 AM EDT ----- Regarding: labs  Zidan Helget, Please let patient know that her hemoglobin has improved to 10.5. Her iron levels are adequate and there is no indication for additional IV iron at this time. Reasonable to take her ferrous sulfate 1 tab twice daily if tolerated and vitamin B complex 1 capsule daily. We shall see her back in 2 months as per plan with repeat labs.  Thanks gk

## 2016-11-04 NOTE — Telephone Encounter (Signed)
Per staff message, SW pt's daughter in law regarding lab results.  Daughter in law will relay message/verbalized understanding.  Informed of apt in 2 months.

## 2016-11-05 LAB — MULTIPLE MYELOMA PANEL, SERUM
Albumin SerPl Elph-Mcnc: 3.3 g/dL (ref 2.9–4.4)
Albumin/Glob SerPl: 1.1 (ref 0.7–1.7)
Alpha 1: 0.3 g/dL (ref 0.0–0.4)
Alpha2 Glob SerPl Elph-Mcnc: 0.7 g/dL (ref 0.4–1.0)
B-Globulin SerPl Elph-Mcnc: 1.1 g/dL (ref 0.7–1.3)
GAMMA GLOB SERPL ELPH-MCNC: 1.2 g/dL (ref 0.4–1.8)
Globulin, Total: 3.2 g/dL (ref 2.2–3.9)
IgA, Qn, Serum: 351 mg/dL (ref 64–422)
IgM, Qn, Serum: 111 mg/dL (ref 26–217)
TOTAL PROTEIN: 6.5 g/dL (ref 6.0–8.5)

## 2016-12-10 ENCOUNTER — Encounter (HOSPITAL_COMMUNITY): Payer: Medicare Other

## 2016-12-15 ENCOUNTER — Other Ambulatory Visit (HOSPITAL_COMMUNITY): Payer: Self-pay | Admitting: *Deleted

## 2016-12-17 ENCOUNTER — Ambulatory Visit (HOSPITAL_COMMUNITY)
Admission: RE | Admit: 2016-12-17 | Discharge: 2016-12-17 | Disposition: A | Discharge status: Home / Self Care | Payer: Medicare Other | Source: Ambulatory Visit | Attending: Nephrology | Admitting: Nephrology

## 2016-12-17 DIAGNOSIS — D631 Anemia in chronic kidney disease: Secondary | ICD-10-CM | POA: Insufficient documentation

## 2016-12-17 MED ORDER — SODIUM CHLORIDE 0.9 % IV SOLN
510.0000 mg | INTRAVENOUS | Status: DC
  Administered 2016-12-17: 09:00:00 510 mg via INTRAVENOUS
  Filled 2016-12-17: qty 17

## 2016-12-24 ENCOUNTER — Encounter (HOSPITAL_COMMUNITY)
Admission: RE | Admit: 2016-12-24 | Discharge: 2016-12-24 | Disposition: A | Discharge status: Home / Self Care | Payer: Medicare Other | Source: Ambulatory Visit | Attending: Nephrology | Admitting: Nephrology

## 2016-12-24 DIAGNOSIS — D631 Anemia in chronic kidney disease: Secondary | ICD-10-CM | POA: Diagnosis not present

## 2016-12-24 MED ORDER — SODIUM CHLORIDE 0.9 % IV SOLN
510.0000 mg | INTRAVENOUS | Status: AC
  Administered 2016-12-24: 510 mg via INTRAVENOUS
  Filled 2016-12-24: qty 17

## 2016-12-31 ENCOUNTER — Other Ambulatory Visit: Payer: Self-pay | Admitting: Gastroenterology

## 2016-12-31 DIAGNOSIS — D649 Anemia, unspecified: Secondary | ICD-10-CM

## 2017-01-04 ENCOUNTER — Other Ambulatory Visit (HOSPITAL_BASED_OUTPATIENT_CLINIC_OR_DEPARTMENT_OTHER): Payer: Medicare Other

## 2017-01-04 ENCOUNTER — Encounter: Payer: Self-pay | Admitting: Hematology

## 2017-01-04 ENCOUNTER — Ambulatory Visit (HOSPITAL_BASED_OUTPATIENT_CLINIC_OR_DEPARTMENT_OTHER): Payer: Medicare Other | Admitting: Hematology

## 2017-01-04 VITALS — BP 107/59 | HR 79 | Temp 98.1°F | Resp 18 | Ht 62.0 in | Wt 159.5 lb

## 2017-01-04 DIAGNOSIS — D500 Iron deficiency anemia secondary to blood loss (chronic): Secondary | ICD-10-CM

## 2017-01-04 DIAGNOSIS — N189 Chronic kidney disease, unspecified: Secondary | ICD-10-CM

## 2017-01-04 DIAGNOSIS — Z9884 Bariatric surgery status: Secondary | ICD-10-CM

## 2017-01-04 DIAGNOSIS — K209 Esophagitis, unspecified: Secondary | ICD-10-CM

## 2017-01-04 DIAGNOSIS — K922 Gastrointestinal hemorrhage, unspecified: Secondary | ICD-10-CM | POA: Diagnosis not present

## 2017-01-04 DIAGNOSIS — K909 Intestinal malabsorption, unspecified: Secondary | ICD-10-CM

## 2017-01-04 DIAGNOSIS — E538 Deficiency of other specified B group vitamins: Secondary | ICD-10-CM

## 2017-01-04 DIAGNOSIS — D649 Anemia, unspecified: Secondary | ICD-10-CM

## 2017-01-04 DIAGNOSIS — D631 Anemia in chronic kidney disease: Secondary | ICD-10-CM | POA: Diagnosis not present

## 2017-01-04 LAB — COMPREHENSIVE METABOLIC PANEL
ALT: 30 U/L (ref 0–55)
AST: 54 U/L — AB (ref 5–34)
Albumin: 3.4 g/dL — ABNORMAL LOW (ref 3.5–5.0)
Alkaline Phosphatase: 131 U/L (ref 40–150)
Anion Gap: 9 mEq/L (ref 3–11)
BUN: 37.6 mg/dL — AB (ref 7.0–26.0)
CALCIUM: 9.2 mg/dL (ref 8.4–10.4)
CHLORIDE: 105 meq/L (ref 98–109)
CO2: 30 meq/L — AB (ref 22–29)
Creatinine: 1.6 mg/dL — ABNORMAL HIGH (ref 0.6–1.1)
EGFR: 28 mL/min/{1.73_m2} — ABNORMAL LOW (ref >90)
Glucose: 130 mg/dl (ref 70–140)
POTASSIUM: 3.6 meq/L (ref 3.5–5.1)
SODIUM: 143 meq/L (ref 136–145)
Total Bilirubin: 0.49 mg/dL (ref 0.20–1.20)
Total Protein: 6.9 g/dL (ref 6.4–8.3)

## 2017-01-04 LAB — CBC & DIFF AND RETIC
BASO%: 0.5 % (ref 0.0–2.0)
Basophils Absolute: 0 10*3/uL (ref 0.0–0.1)
EOS%: 5 % (ref 0.0–7.0)
Eosinophils Absolute: 0.3 10*3/uL (ref 0.0–0.5)
HEMATOCRIT: 33.2 % — AB (ref 34.8–46.6)
HGB: 10.2 g/dL — ABNORMAL LOW (ref 11.6–15.9)
Immature Retic Fract: 8.1 % (ref 1.60–10.00)
LYMPH%: 15.8 % (ref 14.0–49.7)
MCH: 32.3 pg (ref 25.1–34.0)
MCHC: 30.7 g/dL — AB (ref 31.5–36.0)
MCV: 105.1 fL — ABNORMAL HIGH (ref 79.5–101.0)
MONO#: 0.5 10*3/uL (ref 0.1–0.9)
MONO%: 8.3 % (ref 0.0–14.0)
NEUT%: 70.4 % (ref 38.4–76.8)
NEUTROS ABS: 4 10*3/uL (ref 1.5–6.5)
Platelets: 163 10*3/uL (ref 145–400)
RBC: 3.16 10*6/uL — AB (ref 3.70–5.45)
RDW: 17.4 % — AB (ref 11.2–14.5)
RETIC %: 2.13 % — AB (ref 0.70–2.10)
Retic Ct Abs: 67.31 10*3/uL (ref 33.70–90.70)
WBC: 5.7 10*3/uL (ref 3.9–10.3)
lymph#: 0.9 10*3/uL (ref 0.9–3.3)

## 2017-01-04 LAB — FERRITIN: FERRITIN: 532 ng/mL — AB (ref 9–269)

## 2017-01-04 NOTE — Patient Instructions (Signed)
Thank you for choosing [facility] to provide your oncology and hematology care.  To afford each patient quality time with our providers, please arrive 30 minutes before your scheduled appointment time.  If you arrive late for your appointment, you may be asked to reschedule.  We strive to give you quality time with our providers, and arriving late affects you and other patients whose appointments are after yours.  If you are a no show for multiple scheduled visits, you may be dismissed from the clinic at the providers discretion.   Again, thank you for choosing [facility], our hope is that these requests will decrease the amount of time that you wait before being seen by our physicians.  ______________________________________________________________________ Should you have questions after your visit to the [facility], please contact our office at [phone] between the hours of 8:30 and 4:30 p.m.    Voicemails left after 4:30p.m will not be returned until the following business day.   For prescription refill requests, please have your pharmacy contact us directly.  Please also try to allow 48 hours for prescription requests.   Please contact the scheduling department for questions regarding scheduling.  For scheduling of procedures such as PET scans, CT scans, MRI, Ultrasound, etc please contact central scheduling at [phone].   Resources For Cancer Patients and Caregivers:  American Cancer Society:  800-227-2345  Can help patients locate various types of support and financial assistance Cancer Care: 1-800-813-HOPE (4673) Provides financial assistance, online support groups, medication/co-pay assistance.   [place] DSS:  336-641-3447 Where to apply for food stamps, Medicaid, and utility assistance Medicare Rights Center: 800-333-4114 Helps people with Medicare understand their rights and benefits, navigate the Medicare system, and secure the  quality healthcare they deserve SCAT: 336-333-6589 [place] [employer]'s shared-ride transportation service for eligible riders who have a disability that prevents them from riding the fixed route bus.   For additional information on assistance programs please contact our social worker:   Grier Hock/Abigail Elmore:  [phone] 

## 2017-01-05 ENCOUNTER — Other Ambulatory Visit: Payer: Self-pay | Admitting: Gastroenterology

## 2017-01-05 ENCOUNTER — Ambulatory Visit
Admission: RE | Admit: 2017-01-05 | Discharge: 2017-01-05 | Disposition: A | Discharge status: Home / Self Care | Payer: Medicare Other | Source: Ambulatory Visit | Attending: Gastroenterology | Admitting: Gastroenterology

## 2017-01-05 DIAGNOSIS — D649 Anemia, unspecified: Secondary | ICD-10-CM

## 2017-01-05 NOTE — Progress Notes (Signed)
Marland Kitchen    HEMATOLOGY/ONCOLOGY CLINIC NOTE  Date of Service: 01/05/2017  Patient Care Team: Lovenia Kim, PA-C as PCP - General (Physician Assistant)/Stephen Daphane Shepherd MD  CHIEF COMPLAINTS/PURPOSE OF CONSULTATION:  F/u for anemia  HISTORY OF PRESENTING ILLNESS:   Madison Riggs is a wonderful 83 y.o. female who has been referred to [place] by Dr .Lovenia Kim, PA-C for evaluation and management of Anemia.  Patient has multiple medical comorbidities including hypertension, dyslipidemia, hypothyroidism, diabetes, chronic kidney disease and iron deficiency anemia, Roux-en-Y gastric bypass surgery in 1999. She was recently admitted to the hospital after she was noted to have a hemoglobin of 7.7 with her primary care physician in early May 2018. She was admitted to the hospital on 10/16/2016 and was noted to have a hemoglobin of 7.9 with the positive Hemoccult test.  She has also recently had a fall on her right hip with a hematoma.  Labs showed a ferritin level of 23 with an iron saturation of 6% low normal B12 of 292 normal RBC folate. Celiac panel was apparently negative. She received 2 units of PRBCs and IV iron. She had a GI consultation and an endoscopy by Dr. Dorena Cookey which showed LA Grade D (one or more mucosal breaks involving at least 75% of esophageal circumference) esophagitis with no bleeding was found 21 to 38 cm from the incisors.  Hemoglobin on discharge was 8.9. She was discharged with a follow-up with gastroenterology Dr. Dulce Sellar possible additional GI workup. She was started on ferrous sulfate 1 tablet by mouth 3 times a day which she notes is causing a fair amount of constipation.  She has been sent to [place] for further management of her anemia and consideration for additional IV iron.  Patient notes that her stools have turned dark sent she has been on oral iron replacement. Notes no abdominal pain. No weight loss. No nausea or vomiting.   She has chronic kidney disease with a baseline  creatinine of 1.2-1.5. And hypothyroidism on levothyroxine replacement.  INTERVAL HISTORY  Patient is here for follow-up of her anemia. She notes that her hemoglobin had dropped to 9.9 and she received 2 doses of IV iron with Dr. Kathrene Bongo her nephrologist. Her hemoglobin is up to 10.2 today. Notes no overt clinically visible GI bleeding. She is following with Dr. Dulce Sellar for her GI workup. She reports that she is scheduled for a CT of the abdomen and pelvis as per GI on 01/05/2017.  MEDICAL HISTORY:  Past Medical History:  Diagnosis Date  . Aortic stenosis    moderate  . CKD (chronic kidney disease) stage 3, GFR 30-59 ml/min   . Depression   . DM (diabetes mellitus), type 2 (HCC)   . Gout   . HTN (hypertension)   . Hypercholesterolemia   . Hypothyroidism   . Iron deficiency anemia     SURGICAL HISTORY: Past Surgical History:  Procedure Laterality Date  . BACK SURGERY  1984  . CATARACT EXTRACTION, BILATERAL  1997  . CHOLECYSTECTOMY  2001  . ESOPHAGOGASTRODUODENOSCOPY N/A 10/18/2016   Procedure: ESOPHAGOGASTRODUODENOSCOPY (EGD);  Surgeon: Dorena Cookey, MD;  Location: [facility];  Service: Endoscopy;  Laterality: N/A;  . GASTRIC BYPASS  1999  . KNEE SURGERY Left 1994  . THORACENTESIS Right    in [place], [place]  . TONSILLECTOMY      SOCIAL HISTORY: Social History   Social History  . Marital status: Widowed    Spouse name: N/A  . Number of children: N/A  . Years  of education: N/A   Occupational History  . Not on file.   Social History Main Topics  . Smoking status: Never Smoker  . Smokeless tobacco: Never Used  . Alcohol use No  . Drug use: No  . Sexual activity: Not on file   Other Topics Concern  . Not on file   Social History Narrative   Widowed   Lives with son and daughter-in-law   [name] here from [place] 03/25/2016   2 sons   Occupation: retired, was a Museum/gallery conservator with [employer]    FAMILY HISTORY: Family History  Problem Relation Age  of Onset  . Breast cancer Mother   . Colon cancer Father   . Prostate cancer Father   . Emphysema Father     ALLERGIES:  has No Known Allergies.  MEDICATIONS:  Current Outpatient Prescriptions  Medication Sig Dispense Refill  . allopurinol (ZYLOPRIM) 100 MG tablet Take 100 mg by mouth 2 (two) times daily.     . calcium-vitamin D (OSCAL WITH D) 500-200 MG-UNIT tablet Take 1 tablet by mouth every morning.     . cetirizine (ZYRTEC) 10 MG tablet Take 10 mg by mouth every morning.     . Cinnamon 500 MG TABS Take 1,000 mg by mouth every morning.     . folic acid (FOLVITE) 1 MG tablet Take 1 mg by mouth every morning.     Marland Kitchen glucosamine-chondroitin 500-400 MG tablet Take 1 tablet by mouth every morning.     . Lactobacillus (ACIDOPHILUS) 100 MG CAPS Take 100 mg by mouth every morning.     Marland Kitchen levothyroxine (SYNTHROID, LEVOTHROID) 88 MCG tablet Take 88 mcg by mouth every morning.  1  . magnesium gluconate (MAGONATE) 500 MG tablet Take 500 mg by mouth as needed.     . pantoprazole (PROTONIX) 40 MG tablet Take 1 tablet (40 mg total) by mouth 2 (two) times daily. (Patient taking differently: Take 40 mg by mouth daily. ) 60 tablet 0  . polyethylene glycol (MIRALAX / GLYCOLAX) packet Take 17 g by mouth as needed.     . senna-docusate (SENOKOT-S) 8.6-50 MG tablet Take 1 tablet by mouth 2 (two) times daily. 60 tablet 0  . simvastatin (ZOCOR) 20 MG tablet Take 20 mg by mouth every evening.     . torsemide (DEMADEX) 20 MG tablet Take 1 tablet (20 mg total) by mouth 2 (two) times daily.    . traZODone (DESYREL) 50 MG tablet Take 50 mg by mouth at bedtime.    . Turmeric 500 MG TABS Take 500 mg by mouth every morning.     . venlafaxine (EFFEXOR) 75 MG tablet Take 75 mg by mouth 2 (two) times daily.     No current facility-administered medications for this visit.     REVIEW OF SYSTEMS:    10 Point review of Systems was done is negative except as noted above.  PHYSICAL EXAMINATION: ECOG PERFORMANCE  STATUS: 2 - Symptomatic, <50% confined to bed  . Vitals:   01/04/17 1410  BP: (!) 107/59  Pulse: 79  Resp: 18  Temp: 98.1 F (36.7 C)   Filed Weights   01/04/17 1410  Weight: 159 lb 8 oz (72.3 kg)   .Body mass index is 29.17 kg/m.  GENERAL:alert, in no acute distress and comfortable SKIN: no acute rashes, no significant lesions EYES: conjunctiva are pink and non-injected, sclera anicteric OROPHARYNX: MMM, no exudates, no oropharyngeal erythema or ulceration NECK: supple, no JVD LYMPH:  no palpable lymphadenopathy  in the cervical, axillary or inguinal regions LUNGS: clear to auscultation b/l with normal respiratory effort HEART: regular rate & rhythm ABDOMEN:  normoactive bowel sounds , non tender, not distended. Extremity: no pedal edema PSYCH: alert & oriented x 3 with fluent speech NEURO: no focal motor/sensory deficits  LABORATORY DATA:  I have reviewed the data as listed  . CBC Latest Ref Rng & Units 01/04/2017 11/02/2016 11/02/2016  WBC 3.9 - 10.3 10e3/uL 5.7 5.4 -  Hemoglobin 11.6 - 15.9 g/dL 10.2(L) 10.5(L) -  Hematocrit 34.8 - 46.6 % 33.2(L) 33.6(L) 33.4(L)  Platelets 145 - 400 10e3/uL 163 207 -    . CMP Latest Ref Rng & Units 01/04/2017 11/02/2016 11/02/2016  Glucose 70 - 140 mg/dl 130 102 -  BUN 7.0 - 26.0 mg/dL 37.6(H) 33.6(H) -  Creatinine 0.6 - 1.1 mg/dL 1.6(H) 1.5(H) -  Sodium 136 - 145 mEq/L 143 143 -  Potassium 3.5 - 5.1 mEq/L 3.6 3.4(L) -  Chloride 101 - 111 mmol/L - - -  CO2 22 - 29 mEq/L 30(H) 29 -  Calcium 8.4 - 10.4 mg/dL 9.2 9.1 -  Total Protein 6.4 - 8.3 g/dL 6.9 7.2 6.5  Total Bilirubin 0.20 - 1.20 mg/dL 0.49 0.60 -  Alkaline Phos 40 - 150 U/L 131 116 -  AST 5 - 34 U/L 54(H) 54(H) -  ALT 0 - 55 U/L 30 26 -   . Lab Results  Component Value Date   IRON 63 11/02/2016   TIBC 361 11/02/2016   IRONPCTSAT 18 (L) 11/02/2016   (Iron and TIBC)  Lab Results  Component Value Date   FERRITIN 532 (H) 01/04/2017   Component     Latest Ref Rng  & Units 11/02/2016 01/04/2017  IgG (Immunoglobin G), Serum     700 - 1600 mg/dL 1,145   IgA/Immunoglobulin A, Serum     64 - 422 mg/dL 351   IgM, Qn, Serum     26 - 217 mg/dL 111   Total Protein     6.0 - 8.5 g/dL 6.5   Albumin SerPl Elph-Mcnc     2.9 - 4.4 g/dL 3.3   Alpha 1     0.0 - 0.4 g/dL 0.3   Alpha2 Glob SerPl Elph-Mcnc     0.4 - 1.0 g/dL 0.7   B-Globulin SerPl Elph-Mcnc     0.7 - 1.3 g/dL 1.1   Gamma Glob SerPl Elph-Mcnc     0.4 - 1.8 g/dL 1.2   M Protein SerPl Elph-Mcnc     Not Observed g/dL Not Observed   Globulin, Total     2.2 - 3.9 g/dL 3.2   Albumin/Glob SerPl     0.7 - 1.7 1.1   IFE 1      Comment   Please Note (HCV):      Comment   Iron     41 - 142 ug/dL 63   TIBC     236 - 444 ug/dL 361   UIBC     120 - 384 ug/dL 298   %SAT     21 - 57 % 18 (L)   Folate, Hemolysate     Not Estab. ng/mL 489.5   HCT     34.0 - 46.6 % 33.4 (L)   Folate, RBC     >498 ng/mL 1,466   Ferritin     9 - 269 ng/ml 446 (H) 532 (H)  Vitamin B12     232 - 1245 pg/mL 1,326 (H)  RADIOGRAPHIC STUDIES: I have personally reviewed the radiological images as listed and agreed with the findings in the report. No results found.  ASSESSMENT & PLAN:   83 year old female with  #1 Multifactorial Anemia  This appears to be primarily related to iron deficiency from GI bleeding plus poor absorption due to her Roux-en-Y gastric bypass surgery. Also has an acute element of blood loss due to a 5 cm right gluteal hematoma from trauma due to a fall. Element of anemia due to her chronic kidney disease.  recent CT scan also suggests the possibility of liver cirrhosis.   Patient's hemoglobin on labs today is stable at 10.2 compared to 10.5 on previous visit. She has received 2 units of PRBCs and IV iron and her ferritin currently is in the 400s. She will also probably resorb her hematoma. B12 levels are normal at 1326. RBC folate within normal limits Myeloma panel  negative PLAN -Patient received IV iron weekly 2 doses as per Dr. Moshe Cipro her nephrologist in early July. Ferritin levels today are adequate. -No indication for additional IV iron at this time. -Patient prefers to continue follow-up with nephrology regarding management of her iron deficiency anemia and anemia of chronic kidney disease and we shall defer further iron management and need for EPO to them. -Would need IV iron when necessary to maintain her ferritin level more than 100 in the setting of chronic kidney disease. -On PPI twice a day for her severe esophagitis which could be a source of chronic blood loss. -She is following up with Dr. Paulita Fujita for additional gastroenterology workup as indicated for her GI bleeding and liver cirrhosis. She has been scheduled for CT of the abdomen and pelvis on 01/05/2017. -Reasonable to take a daily vitamin B complex to support accelerated erythropoiesis. -Further evaluation for possible liver cirrhosis as per primary care physician. -No indication for bone marrow biopsy at this time.  #2 . Patient Active Problem List   Diagnosis Date Noted  . Chronic diastolic HF (heart failure) ([facility]) 10/30/2016  . Abnormal EKG 10/30/2016  . Nonrheumatic aortic valve stenosis 10/30/2016  . Anemia 10/16/2016  . Acute renal failure (ARF) ([facility]) 10/16/2016  . Hypertension 10/16/2016  . Type II diabetes mellitus with manifestations ([facility]) 10/16/2016  . Dyspnea and respiratory abnormalities 06/02/2016   -Follow-up with primary care physician for management of other medical comorbidities  Return to care with Dr. Irene Limbo on as needed basis.   All of the patients questions were answered with apparent satisfaction. The patient knows to call the clinic with any problems, questions or concerns.  I spent 20 minutes counseling the patient face to face. The total time spent in the appointment was 25 minutes and more than 50% was on counseling and direct patient cares.     Sullivan Lone MD [place] AAHIVMS [facility] Hematology/Oncology Physician [facility]  (Office):       [phone] (Work cell):  [phone] (Fax):           [phone]

## 2017-01-13 ENCOUNTER — Other Ambulatory Visit (HOSPITAL_COMMUNITY): Payer: Self-pay | Admitting: Gastroenterology

## 2017-01-13 DIAGNOSIS — R188 Other ascites: Secondary | ICD-10-CM

## 2017-02-01 ENCOUNTER — Telehealth: Payer: Self-pay | Admitting: Cardiology

## 2017-02-01 NOTE — Progress Notes (Signed)
Cardiology Office Note   Date:  02/02/2017   ID:  Madison Riggs, DOB [birth year], MRN [id number]  PCP:  Starlyn Skeans, PA-C  Cardiologist:   Minus Breeding, MD  Referring:  Starlyn Skeans, PA-CcH  Chief Complaint  Patient presents with  . Chronic Diastolic Dysfunction      History of Present Illness: Madison Riggs is a 83 y.o. female who presents for evaluation of diastolic heart failure.  She presents for follow-up. She's going to have a paracentesis tomorrow because of abdominal ascites. Her weights have been actually stable. She has severe lower extremity swelling that actually is unchanged she says from previous. She drinks a little too much fluid probably using too much salt per her son. She sleeps on a wedge for reflux. She denies any acute cardiovascular symptoms of chest discomfort, neck or arm discomfort. She's not had any new palpitations, presyncope or syncope. She's had no PND or orthopnea.  Past Medical History:  Diagnosis Date  . Aortic stenosis    moderate  . CKD (chronic kidney disease) stage 3, GFR 30-59 ml/min   . Depression   . DM (diabetes mellitus), type 2 ([place])   . Gout   . HTN (hypertension)   . Hypercholesterolemia   . Hypothyroidism   . Iron deficiency anemia     Past Surgical History:  Procedure Laterality Date  . BACK SURGERY  1984  . CATARACT EXTRACTION, BILATERAL  1997  . CHOLECYSTECTOMY  2001  . ESOPHAGOGASTRODUODENOSCOPY N/A 10/18/2016   Procedure: ESOPHAGOGASTRODUODENOSCOPY (EGD);  Surgeon: Teena Irani, MD;  Location: [facility];  Service: Endoscopy;  Laterality: N/A;  . GASTRIC BYPASS  1999  . KNEE SURGERY Left 1994  . THORACENTESIS Right    in [place], [place]  . TONSILLECTOMY       Current Outpatient Prescriptions  Medication Sig Dispense Refill  . acetaminophen (TYLENOL) 500 MG tablet Take 500 mg by mouth every 6 (six) hours as needed for moderate pain.    Marland Kitchen allopurinol (ZYLOPRIM) 100 MG tablet Take 100 mg by mouth  2 (two) times daily.     Marland Kitchen b complex vitamins tablet Take 1 tablet by mouth daily.    . calcium-vitamin D (OSCAL WITH D) 500-200 MG-UNIT tablet Take 1 tablet by mouth every morning.     . cetirizine (ZYRTEC) 10 MG tablet Take 10 mg by mouth every morning.     . Cinnamon 500 MG TABS Take 1,000 mg by mouth every morning.     . fluticasone (FLONASE) 50 MCG/ACT nasal spray Place 1 spray into both nostrils daily as needed.    . folic acid (FOLVITE) 1 MG tablet Take 1 mg by mouth every morning.     Marland Kitchen glucosamine-chondroitin 500-400 MG tablet Take 1 tablet by mouth every morning.     . Lactobacillus (ACIDOPHILUS) 100 MG CAPS Take 100 mg by mouth every morning.     Marland Kitchen levothyroxine (SYNTHROID, LEVOTHROID) 88 MCG tablet Take 88 mcg by mouth every morning.  1  . magnesium gluconate (MAGONATE) 500 MG tablet Take 500 mg by mouth as needed.     . pantoprazole (PROTONIX) 40 MG tablet Take 1 tablet (40 mg total) by mouth 2 (two) times daily. 60 tablet 0  . senna-docusate (SENOKOT-S) 8.6-50 MG tablet Take 1 tablet by mouth 2 (two) times daily. 60 tablet 0  . simvastatin (ZOCOR) 40 MG tablet Take 20 mg by mouth daily.    Marland Kitchen torsemide (DEMADEX) 20 MG tablet Take 1 tablet (20  mg total) by mouth 2 (two) times daily.    . traZODone (DESYREL) 50 MG tablet Take 50 mg by mouth at bedtime.    . Turmeric 500 MG TABS Take 500 mg by mouth every morning.     . venlafaxine (EFFEXOR) 75 MG tablet Take 75 mg by mouth 2 (two) times daily.     No current facility-administered medications for this visit.     Allergies:   Patient has no known allergies.    ROS:  Please see the history of present illness.   Otherwise, review of systems are positive for none.   All other systems are reviewed and negative.    PHYSICAL EXAM: VS:  BP 106/60   Pulse 77   Ht 5\' 2"  (1.575 m)   Wt 159 lb 3.2 oz (72.2 kg)   BMI 29.12 kg/m  , BMI Body mass index is 29.12 kg/m.  GENERAL:  Well appearing NECK:  No jugular venous distention,  waveform within normal limits, carotid upstroke brisk and symmetric, no bruits, no thyromegaly LUNGS:  Decreased breath sounds at the right base.   CHEST:  Unremarkable HEART:  PMI not displaced or sustained,S1 and S2 within normal limits, no S3, no S4, no clicks, no rubs, 3/6 holosystolic, no diastolic murmurs ABD:  Distended positive bowel sounds normal in frequency in pitch, no bruits, no rebound, no guarding, no midline pulsatile mass, no hepatomegaly, no splenomegaly, positive ascites EXT:  2 plus pulses throughout, severe edema to mid thigh, no cyanosis no clubbing   GENERAL:  Somewhat frail appearing HEENT:  Pupils equal round and reactive, fundi not visualized, oral mucosa unremarkable NECK:  No jugular venous distention, waveform within normal limits, carotid upstroke brisk and symmetric, no bruits, no thyromegaly LYMPHATICS:  No cervical, inguinal adenopathy LUNGS:  Clear to auscultation bilaterally BACK:  No CVA tenderness CHEST:  Unremarkable HEART:  PMI not displaced or sustained,S1 and S2 within normal limits, no S3, no S4, no clicks, no rubs, 2 out of 6 apical systolic murmur radiating out the aortic outflow tract, no diastolic murmurs ABD:  Flat, positive bowel sounds normal in frequency in pitch, no bruits, no rebound, no guarding, no midline pulsatile mass, no hepatomegaly, no splenomegaly EXT:  2 plus pulses throughout, moderate leg edema, no cyanosis no clubbing SKIN:  No rashes no nodules NEURO:  Cranial nerves II through XII grossly intact, motor grossly intact throughout PSYCH:  Cognitively intact, oriented to person place and time    EKG:  EKG is not ordered today.    Recent Labs: 01/04/2017: ALT 30; BUN 37.6; Creatinine 1.6; HGB 10.2; Platelets 163; Potassium 3.6; Sodium 143    Lipid Panel No results found for: CHOL, TRIG, HDL, CHOLHDL, VLDL, LDLCALC, LDLDIRECT    Wt Readings from Last 3 Encounters:  02/02/17 159 lb 3.2 oz (72.2 kg)  01/04/17 159 lb 8 oz  (72.3 kg)  12/24/16 156 lb (70.8 kg)      Other studies Reviewed: Additional studies/ records that were reviewed today include:   None Review of the above records demonstrates:     ASSESSMENT AND PLAN:   AORTIC STENOSIS:  This is mild and will be followed clinically.   CHRONIC DIASTOLIC HF:  She seems to be mostly with lower extremity and abdominal edema. Her neck veins actually aren't significantly elevated. We talked at great length about salt and fluid. For now she is only diuretic. We discussed when necessary dosing. She'll get some labs drawn today.  CKD II -  III:  Her last creatinine was 1.6. She'll have labs today.   Current medicines are reviewed at length with the patient today.  The patient does not have concerns regarding medicines.  The following changes have been made:  None  Labs/ tests ordered today include:   Orders Placed This Encounter  Procedures  . CBC w/Diff/Platelet  . INR/PT  . APTT  . Hepatic function panel     Disposition:   FU with APP in two months.     Signed, Minus Breeding, MD  02/02/2017 10:27 PM    [place]

## 2017-02-01 NOTE — Telephone Encounter (Signed)
Received records from [facility] for appointment on 02/02/17 with Dr Percival Spanish.  Records put with Dr Hochrein's schedule for 02/02/17. lp

## 2017-02-02 ENCOUNTER — Ambulatory Visit (INDEPENDENT_AMBULATORY_CARE_PROVIDER_SITE_OTHER): Payer: Medicare Other | Admitting: Cardiology

## 2017-02-02 ENCOUNTER — Encounter: Payer: Self-pay | Admitting: Cardiology

## 2017-02-02 VITALS — BP 106/60 | HR 77 | Ht 62.0 in | Wt 159.2 lb

## 2017-02-02 DIAGNOSIS — Z01812 Encounter for preprocedural laboratory examination: Secondary | ICD-10-CM

## 2017-02-02 DIAGNOSIS — Z79899 Other long term (current) drug therapy: Secondary | ICD-10-CM

## 2017-02-02 DIAGNOSIS — D689 Coagulation defect, unspecified: Secondary | ICD-10-CM | POA: Diagnosis not present

## 2017-02-02 NOTE — Patient Instructions (Signed)
Medication Instructions:  Continue current medications  Labwork: Labs today  Testing/Procedures: None ordered  Follow-Up: Your physician recommends that you schedule a follow-up appointment in: 2 Months with Suanne Marker or [place]   Any Other Special Instructions Will Be Listed Below (If Applicable).   If you need a refill on your cardiac medications before your next appointment, please call your pharmacy.

## 2017-02-03 LAB — CBC WITH DIFFERENTIAL/PLATELET
BASOS: 0 %
Basophils Absolute: 0 10*3/uL (ref 0.0–0.2)
EOS (ABSOLUTE): 0.3 10*3/uL (ref 0.0–0.4)
Eos: 5 %
Hematocrit: 30.9 % — ABNORMAL LOW (ref 34.0–46.6)
Hemoglobin: 9.8 g/dL — ABNORMAL LOW (ref 11.1–15.9)
Immature Grans (Abs): 0 10*3/uL (ref 0.0–0.1)
Immature Granulocytes: 0 %
Lymphocytes Absolute: 0.6 10*3/uL — ABNORMAL LOW (ref 0.7–3.1)
Lymphs: 11 %
MCH: 31.5 pg (ref 26.6–33.0)
MCHC: 31.7 g/dL (ref 31.5–35.7)
MCV: 99 fL — AB (ref 79–97)
MONOS ABS: 0.6 10*3/uL (ref 0.1–0.9)
Monocytes: 10 %
NEUTROS ABS: 3.9 10*3/uL (ref 1.4–7.0)
Neutrophils: 74 %
Platelets: 188 10*3/uL (ref 150–379)
RBC: 3.11 x10E6/uL — ABNORMAL LOW (ref 3.77–5.28)
RDW: 14.5 % (ref 12.3–15.4)
WBC: 5.4 10*3/uL (ref 3.4–10.8)

## 2017-02-03 LAB — PROTIME-INR
INR: 1.1 (ref 0.8–1.2)
Prothrombin Time: 11.7 s (ref 9.1–12.0)

## 2017-02-03 LAB — HEPATIC FUNCTION PANEL
ALBUMIN: 3.8 g/dL (ref 3.5–4.7)
ALK PHOS: 155 IU/L — AB (ref 39–117)
ALT: 21 IU/L (ref 0–32)
AST: 41 IU/L — ABNORMAL HIGH (ref 0–40)
BILIRUBIN, DIRECT: 0.2 mg/dL (ref 0.00–0.40)
Bilirubin Total: 0.3 mg/dL (ref 0.0–1.2)
TOTAL PROTEIN: 6.6 g/dL (ref 6.0–8.5)

## 2017-02-03 LAB — APTT: aPTT: 28 s (ref 24–33)

## 2017-02-09 ENCOUNTER — Ambulatory Visit (HOSPITAL_COMMUNITY)
Admission: RE | Admit: 2017-02-09 | Discharge: 2017-02-09 | Disposition: A | Discharge status: Home / Self Care | Payer: Medicare Other | Source: Ambulatory Visit | Attending: Gastroenterology | Admitting: Gastroenterology

## 2017-02-09 ENCOUNTER — Encounter (HOSPITAL_COMMUNITY): Payer: Self-pay | Admitting: Radiology

## 2017-02-09 DIAGNOSIS — R188 Other ascites: Secondary | ICD-10-CM | POA: Insufficient documentation

## 2017-02-09 HISTORY — PX: IR PARACENTESIS: IMG2679

## 2017-02-09 LAB — PROTEIN, PLEURAL OR PERITONEAL FLUID: TOTAL PROTEIN, FLUID: 3.8 g/dL

## 2017-02-09 LAB — AMYLASE, PLEURAL OR PERITONEAL FLUID: Amylase, Fluid: 22 U/L

## 2017-02-09 MED ORDER — LIDOCAINE HCL (PF) 1 % IJ SOLN
INTRAMUSCULAR | Status: AC
  Filled 2017-02-09: qty 30

## 2017-02-09 MED ORDER — ALBUMIN HUMAN 25 % IV SOLN
12.5000 g | Freq: Once | INTRAVENOUS | Status: AC
  Administered 2017-02-09: 12.5 g via INTRAVENOUS
  Filled 2017-02-09: qty 50

## 2017-02-09 NOTE — Procedures (Signed)
  US guided RLQ paracentesis 2L maximum per MD Amber color fluid  Sent for labs per MD Tolerated well

## 2017-02-10 LAB — TRIGLYCERIDES, BODY FLUIDS: Triglycerides, Fluid: 20 mg/dL

## 2017-03-04 ENCOUNTER — Other Ambulatory Visit (HOSPITAL_COMMUNITY): Payer: Self-pay

## 2017-03-05 ENCOUNTER — Encounter (HOSPITAL_COMMUNITY)
Admission: RE | Admit: 2017-03-05 | Discharge: 2017-03-05 | Disposition: A | Discharge status: Home / Self Care | Payer: Medicare Other | Source: Ambulatory Visit | Attending: Nephrology | Admitting: Nephrology

## 2017-03-05 DIAGNOSIS — D631 Anemia in chronic kidney disease: Secondary | ICD-10-CM | POA: Diagnosis not present

## 2017-03-05 MED ORDER — SODIUM CHLORIDE 0.9 % IV SOLN
510.0000 mg | Freq: Once | INTRAVENOUS | Status: AC
  Administered 2017-03-05: 11:00:00 510 mg via INTRAVENOUS
  Filled 2017-03-05: qty 17

## 2017-04-09 ENCOUNTER — Ambulatory Visit: Payer: Medicare Other | Admitting: Physician Assistant

## 2017-04-12 ENCOUNTER — Other Ambulatory Visit (HOSPITAL_COMMUNITY): Payer: Self-pay

## 2017-04-13 ENCOUNTER — Encounter (HOSPITAL_COMMUNITY)
Admission: RE | Admit: 2017-04-13 | Discharge: 2017-04-13 | Disposition: A | Discharge status: Home / Self Care | Payer: Medicare Other | Source: Ambulatory Visit | Attending: Nephrology | Admitting: Nephrology

## 2017-04-13 DIAGNOSIS — D631 Anemia in chronic kidney disease: Secondary | ICD-10-CM | POA: Diagnosis present

## 2017-04-13 MED ORDER — SODIUM CHLORIDE 0.9 % IV SOLN
510.0000 mg | Freq: Once | INTRAVENOUS | Status: AC
  Administered 2017-04-13: 12:00:00 510 mg via INTRAVENOUS
  Filled 2017-04-13: qty 17

## 2017-04-14 ENCOUNTER — Ambulatory Visit: Payer: Medicare Other | Admitting: Physician Assistant

## 2017-04-15 ENCOUNTER — Ambulatory Visit (INDEPENDENT_AMBULATORY_CARE_PROVIDER_SITE_OTHER): Payer: Medicare Other | Admitting: Physician Assistant

## 2017-04-15 ENCOUNTER — Encounter: Payer: Self-pay | Admitting: Physician Assistant

## 2017-04-15 VITALS — BP 110/58 | HR 90 | Ht 62.0 in | Wt 160.6 lb

## 2017-04-15 DIAGNOSIS — N183 Chronic kidney disease, stage 3 unspecified: Secondary | ICD-10-CM

## 2017-04-15 DIAGNOSIS — I350 Nonrheumatic aortic (valve) stenosis: Secondary | ICD-10-CM | POA: Diagnosis not present

## 2017-04-15 DIAGNOSIS — E785 Hyperlipidemia, unspecified: Secondary | ICD-10-CM | POA: Diagnosis not present

## 2017-04-15 DIAGNOSIS — I5032 Chronic diastolic (congestive) heart failure: Secondary | ICD-10-CM

## 2017-04-15 DIAGNOSIS — I10 Essential (primary) hypertension: Secondary | ICD-10-CM | POA: Diagnosis not present

## 2017-04-15 DIAGNOSIS — E039 Hypothyroidism, unspecified: Secondary | ICD-10-CM

## 2017-04-15 DIAGNOSIS — R188 Other ascites: Secondary | ICD-10-CM

## 2017-04-15 NOTE — Patient Instructions (Signed)
Medication Instructions:  Your physician recommends that you continue on your current medications as directed. Please refer to the Current Medication list given to you today.  Labwork: NONE   Testing/Procedures: NONE   Follow-Up: Your physician recommends that you schedule a follow-up appointment in: [place]   Any Other Special Instructions Will Be Listed Below (If Applicable).  If you need a refill on your cardiac medications before your next appointment, please call your pharmacy.

## 2017-04-15 NOTE — Progress Notes (Signed)
Cardiology Office Note    Date:  04/17/2017   ID:  Madison Riggs, DOB June 27, 1927, MRN [id number]  PCP:  Starlyn Skeans, PA-C  Cardiologist:  Dr. Percival Spanish  Chief Complaint  Patient presents with  . Follow-up    seen for Dr. Percival Spanish    History of Present Illness:  Madison Riggs is a 83 y.o. female with PMH of CKD stage III, HTN, HLD, DM II, moderate AS, chronic diastolic heart failure, ascites and hypothyroidism. She was hospitalized in May 2018 for anemia. Hemoglobin in the ED was 7.9 with positive Hemoccult. She required transfusion. EGD performed on 10/18/2016 was positive for reflux esophagitis, however no obvious source of bleeding. She did have some pulmonary edema on chest x-ray as well. Torsemide was decreased at discharge. Echocardiogram obtained on 10/17/2016 showed EF 60-65%, hypokinesis of the mid apical anterior septal myocardium, grade 2 DD, mild MS/AS, moderate TR, PA peak pressure 48 mmHg. Echocardiogram also noted ascites. CT of chest without contrast also noted partially visible abdominal ascites, possible liver cirrhosis and prior gastric bypass. Abdominal CT obtained on 01/05/2017 showed large volume of abdominal ascites, diffuse body wall edema consistent with anasarca, small pleural effusion and atelectasis in the right lung base, aortic atherosclerosis. She most recently underwent paracentesis on 02/01/2017 with removal of 2 L peritoneal fluid. Cytology was negative for malignancy. Although her albumin was occasionally low, however most of the times her albumin was normal. His last albumin on 02/02/2017 was 3.8 normal.  Patient presents today for cardiology office visit. Her abdomen is soft, she denies any significant abdominal distention. She denies significant abdominal discomfort, shortness of breath, orthopnea or PND.   Past Medical History:  Diagnosis Date  . Aortic stenosis    moderate  . CKD (chronic kidney disease) stage 3, GFR 30-59 ml/min (HCC)   .  Depression   . DM (diabetes mellitus), type 2 ([place])   . Gout   . HTN (hypertension)   . Hypercholesterolemia   . Hypothyroidism   . Iron deficiency anemia     Past Surgical History:  Procedure Laterality Date  . BACK SURGERY  1984  . CATARACT EXTRACTION, BILATERAL  1997  . CHOLECYSTECTOMY  2001  . ESOPHAGOGASTRODUODENOSCOPY N/A 10/18/2016   Procedure: ESOPHAGOGASTRODUODENOSCOPY (EGD);  Surgeon: Teena Irani, MD;  Location: [facility];  Service: Endoscopy;  Laterality: N/A;  . GASTRIC BYPASS  1999  . IR PARACENTESIS  02/09/2017  . KNEE SURGERY Left 1994  . THORACENTESIS Right    in [place], [place]  . TONSILLECTOMY      Current Medications: Outpatient Medications Prior to Visit  Medication Sig Dispense Refill  . acetaminophen (TYLENOL) 500 MG tablet Take 500 mg by mouth every 6 (six) hours as needed for moderate pain.    Marland Kitchen allopurinol (ZYLOPRIM) 100 MG tablet Take 100 mg by mouth 2 (two) times daily.     Marland Kitchen b complex vitamins tablet Take 1 tablet by mouth daily.    . cetirizine (ZYRTEC) 10 MG tablet Take 10 mg by mouth at bedtime.     . fluticasone (FLONASE) 50 MCG/ACT nasal spray Place 1 spray into both nostrils daily as needed.    . folic acid (FOLVITE) 1 MG tablet Take 1 mg by mouth every morning.     . Lactobacillus (ACIDOPHILUS) 100 MG CAPS Take 100 mg by mouth every morning.     Marland Kitchen levothyroxine (SYNTHROID, LEVOTHROID) 88 MCG tablet Take 88 mcg by mouth every morning.  1  . pantoprazole (PROTONIX)  40 MG tablet Take 1 tablet (40 mg total) by mouth 2 (two) times daily. 60 tablet 0  . senna-docusate (SENOKOT-S) 8.6-50 MG tablet Take 1 tablet by mouth 2 (two) times daily. (Patient taking differently: Take 2 tablets by mouth at bedtime. ) 60 tablet 0  . simvastatin (ZOCOR) 40 MG tablet Take 20 mg by mouth daily.    Marland Kitchen torsemide (DEMADEX) 20 MG tablet Take 1 tablet (20 mg total) by mouth 2 (two) times daily.    . traZODone (DESYREL) 50 MG tablet Take 100 mg by mouth at bedtime.     .  Turmeric 500 MG TABS Take 500 mg by mouth every morning.     . venlafaxine (EFFEXOR) 75 MG tablet Take 75 mg by mouth daily.     . calcium-vitamin D (OSCAL WITH D) 500-200 MG-UNIT tablet Take 1 tablet by mouth every morning.     . Cinnamon 500 MG TABS Take 1,000 mg by mouth every morning.     Marland Kitchen glucosamine-chondroitin 500-400 MG tablet Take 1 tablet by mouth every morning.     . magnesium gluconate (MAGONATE) 500 MG tablet Take 500 mg by mouth as needed.      No facility-administered medications prior to visit.      Allergies:   Patient has no known allergies.   Social History   Social History  . Marital status: Widowed    Spouse name: N/A  . Number of children: N/A  . Years of education: N/A   Social History Main Topics  . Smoking status: Never Smoker  . Smokeless tobacco: Never Used  . Alcohol use No  . Drug use: No  . Sexual activity: Not Asked   Other Topics Concern  . None   Social History Narrative   Widowed   Lives with son and daughter-in-law   Madison Riggs here from [place] 03/25/2016   2 sons   Occupation: retired, was a Museum/gallery conservator with [employer]     Family History:  The patient's family history includes Breast cancer in her mother; Colon cancer in her father; Emphysema in her father; Prostate cancer in her father.   ROS:   Please see the history of present illness.    ROS All other systems reviewed and are negative.   PHYSICAL EXAM:   VS:  BP (!) 110/58 (BP Location: Left Arm, Patient Position: Sitting, Cuff Size: Normal)   Pulse 90   Ht 5\' 2"  (1.575 m)   Wt 160 lb 9.6 oz (72.8 kg)   BMI 29.37 kg/m    GEN: Well nourished, well developed, in no acute distress  HEENT: normal  Neck: no JVD, carotid bruits, or masses Cardiac: RRR; no murmurs, rubs, or gallops. 2+ edema  Respiratory:  clear to auscultation bilaterally, normal work of breathing GI: soft, nontender, nondistended, + BS MS: no deformity or atrophy  Skin: warm and dry, no rash Neuro:   Alert and Oriented x 3, Strength and sensation are intact Psych: euthymic mood, full affect  Wt Readings from Last 3 Encounters:  04/15/17 160 lb 9.6 oz (72.8 kg)  04/13/17 155 lb (70.3 kg)  03/05/17 155 lb (70.3 kg)      Studies/Labs Reviewed:   EKG:  EKG is not ordered today.    Recent Labs: 01/04/2017: BUN 37.6; Creatinine 1.6; Potassium 3.6; Sodium 143 02/02/2017: ALT 21; Hemoglobin 9.8; Platelets 188   Lipid Panel No results found for: CHOL, TRIG, HDL, CHOLHDL, VLDL, LDLCALC, LDLDIRECT  Additional studies/ records that were reviewed  today include:   Echo 10/17/2016 LV EF: 60% -   65%  Study Conclusions  - Left ventricle: The cavity size was normal. Systolic function was   normal. The estimated ejection fraction was in the range of 60%   to 65%. Hypokinesis of the mid-apicalanteroseptal myocardium.   Features are consistent with a pseudonormal left ventricular   filling pattern, with concomitant abnormal relaxation and   increased filling pressure (grade 2 diastolic dysfunction).   Doppler parameters are consistent with high ventricular filling   pressure. - Ventricular septum: Septal motion showed &quot;bounce&quot;. The contour   showed diastolic flattening consistent with right ventricular   volume overload. - Aortic valve: There was mild stenosis. There was no   regurgitation. Valve area (VTI): 1.1 cm^2. Valve area (Vmax): 1.1   cm^2. Valve area (Vmean): 0.96 cm^2. - Mitral valve: Severely calcified annulus. Calcification.   Thickening. The findings are consistent with mild stenosis. There   was trivial regurgitation. Valve area by continuity equation   (using LVOT flow): 1.74 cm^2. - Left atrium: The atrium was severely dilated. - Right ventricle: The cavity size was normal. Wall thickness was   normal. Systolic function was normal. - Atrial septum: No defect or patent foramen ovale was identified   by color flow Doppler. - Tricuspid valve: There was moderate  regurgitation. - Pulmonary arteries: Systolic pressure was mildly to moderately   increased. PA peak pressure: 48 mm Hg (S). - Pericardium, extracardiac: Ascites was noted.     CT chest 10/17/2016 IMPRESSION: 1. Cardiomegaly, calcified aortic and coronary artery atherosclerosis, and enlarged central pulmonary arteries suggesting a degree of pulmonary artery hypertension. 2. Small layering left and trace right pleural effusions. 3. Chronic lung changes including some architectural distortion in the right lung and right greater than left subpleural reticular opacity which might reflect a degree of fibrosis or chronic fibrothorax. 4. Nonspecific confluent peribronchial opacity in the right lower lobe, but partially calcified. Favor chronic round atelectasis over acute bronchopneumonia. 5. Partially visible abdominal ascites.  Possible liver cirrhosis. 6. Prior gastric bypass.     CT of [place] 01/05/2017 IMPRESSION: 1. Large volume of abdominal ascites. There is also evidence of diffuse body wall edema consistent with anasarca. 2.  Aortic Atherosclerosis (ICD10-I70.0). 3. Small pleural effusions and probable rounded atelectasis in the right lung base. 4. Lumbar spondylosis.    ASSESSMENT:    1. Chronic diastolic heart failure ([place])   2. Moderate aortic stenosis   3. Essential hypertension   4. Hyperlipidemia, unspecified hyperlipidemia type   5. CKD (chronic kidney disease), stage III (HCC)   6. Other ascites   7. Hypothyroidism, unspecified type      PLAN:  In order of problems listed above:  1. Chronic diastolic heart failure: No obvious recurrence of ascites, abdomen soft, nondistended. She continued to have lower extremity edema, this is managed by compression stocking. Weight unchanged. We'll continue on the current torsemide 20 mg twice a day.  2. Aortic stenosis: Continue to monitor. Last echocardiogram obtained on 10/17/2016 only showed mild aortic  stenosis.  3. Hypertension: Blood pressure well controlled  4. Hyperlipidemia: Continue Zocor 40 mg daily  5. CKD stage III: Renal function is stable on current medication. Renal function monitored by nephrologist.  6. Hypothyroidism: On Synthroid, managed by primary care provider     Medication Adjustments/Labs and Tests Ordered: Current medicines are reviewed at length with the patient today.  Concerns regarding medicines are outlined above.  Medication changes, Labs and Tests ordered  today are listed in the Patient Instructions below. Patient Instructions  Medication Instructions:  Your physician recommends that you continue on your current medications as directed. Please refer to the Current Medication list given to you today.  Labwork: NONE   Testing/Procedures: NONE   Follow-Up: Your physician recommends that you schedule a follow-up appointment in: [place]   Any Other Special Instructions Will Be Listed Below (If Applicable).  If you need a refill on your cardiac medications before your next appointment, please call your pharmacy.     Hilbert Corrigan, [place]  04/17/2017 2:27 PM    [facility] [place], [place], [place]  [address] Phone: [phone]; Fax: [phone]

## 2017-04-17 ENCOUNTER — Encounter: Payer: Self-pay | Admitting: Physician Assistant

## 2017-06-10 ENCOUNTER — Other Ambulatory Visit (HOSPITAL_COMMUNITY): Payer: Self-pay | Admitting: *Deleted

## 2017-06-11 ENCOUNTER — Encounter (HOSPITAL_COMMUNITY)
Admission: RE | Admit: 2017-06-11 | Discharge: 2017-06-11 | Disposition: A | Discharge status: Home / Self Care | Payer: Medicare Other | Source: Ambulatory Visit | Attending: Nephrology | Admitting: Nephrology

## 2017-06-11 DIAGNOSIS — D631 Anemia in chronic kidney disease: Secondary | ICD-10-CM | POA: Insufficient documentation

## 2017-06-11 MED ORDER — SODIUM CHLORIDE 0.9 % IV SOLN
510.0000 mg | INTRAVENOUS | Status: DC
  Administered 2017-06-11: 510 mg via INTRAVENOUS
  Filled 2017-06-11: qty 17

## 2017-07-13 ENCOUNTER — Encounter (HOSPITAL_COMMUNITY)
Admission: RE | Admit: 2017-07-13 | Discharge: 2017-07-13 | Disposition: A | Discharge status: Home / Self Care | Payer: Medicare Other | Source: Ambulatory Visit | Attending: Nephrology | Admitting: Nephrology

## 2017-07-13 DIAGNOSIS — D631 Anemia in chronic kidney disease: Secondary | ICD-10-CM | POA: Insufficient documentation

## 2017-07-13 MED ORDER — SODIUM CHLORIDE 0.9 % IV SOLN
510.0000 mg | INTRAVENOUS | Status: DC
  Administered 2017-07-13: 08:00:00 510 mg via INTRAVENOUS
  Filled 2017-07-13: qty 17

## 2017-08-10 ENCOUNTER — Ambulatory Visit (HOSPITAL_COMMUNITY)
Admission: RE | Admit: 2017-08-10 | Discharge: 2017-08-10 | Disposition: A | Discharge status: Home / Self Care | Payer: Medicare Other | Source: Ambulatory Visit | Attending: Internal Medicine | Admitting: Internal Medicine

## 2017-08-10 DIAGNOSIS — Z9884 Bariatric surgery status: Secondary | ICD-10-CM | POA: Diagnosis not present

## 2017-08-10 DIAGNOSIS — D509 Iron deficiency anemia, unspecified: Secondary | ICD-10-CM | POA: Insufficient documentation

## 2017-08-10 MED ORDER — FERUMOXYTOL INJECTION 510 MG/17 ML
510.0000 mg | INTRAVENOUS | Status: DC
  Administered 2017-08-10: 510 mg via INTRAVENOUS
  Filled 2017-08-10: qty 17

## 2017-08-18 NOTE — Progress Notes (Signed)
Cardiology Office Note   Date:  08/19/2017   ID:  Madison Riggs, DOB 23-Sep-1927, MRN [id number]  PCP:  Starlyn Skeans, PA-C  Cardiologist:   Minus Breeding, MD  Referring:  Starlyn Skeans, PA-CcH  Chief Complaint  Patient presents with  . Ascites      History of Present Illness: Madison Riggs is a 84 y.o. female who presents for evaluation of diastolic heart failure.  She presents for follow-up. Since I last saw her she had paracentesis.  Her weights have been stable at home.  Her lower extremity swelling has been unchanged.  However, she has had increased abdominal distension.  She sleeps in a chair for reflux.  The patient denies any new symptoms such as chest discomfort, neck or arm discomfort. There has been no new shortness of breath, PND or orthopnea. There have been no reported palpitations, presyncope or syncope.  She is using CPAP and has been diagnosed with sleep apnea.  This has helped reduce her O2 desaturations.  She drinks too much fluid and uses salt per her son.    Past Medical History:  Diagnosis Date  . Aortic stenosis    moderate  . CKD (chronic kidney disease) stage 3, GFR 30-59 ml/min (HCC)   . Depression   . DM (diabetes mellitus), type 2 ([place])   . Gout   . HTN (hypertension)   . Hypercholesterolemia   . Hypothyroidism   . Iron deficiency anemia     Past Surgical History:  Procedure Laterality Date  . BACK SURGERY  1984  . CATARACT EXTRACTION, BILATERAL  1997  . CHOLECYSTECTOMY  2001  . ESOPHAGOGASTRODUODENOSCOPY N/A 10/18/2016   Procedure: ESOPHAGOGASTRODUODENOSCOPY (EGD);  Surgeon: Teena Irani, MD;  Location: [facility];  Service: Endoscopy;  Laterality: N/A;  . GASTRIC BYPASS  1999  . IR PARACENTESIS  02/09/2017  . KNEE SURGERY Left 1994  . THORACENTESIS Right    in [place], [place]  . TONSILLECTOMY       Current Outpatient Medications  Medication Sig Dispense Refill  . acetaminophen (TYLENOL) 500 MG tablet Take 500 mg by mouth  every 6 (six) hours as needed for moderate pain.    Marland Kitchen allopurinol (ZYLOPRIM) 100 MG tablet Take 100 mg by mouth 2 (two) times daily.     Marland Kitchen b complex vitamins tablet Take 1 tablet by mouth daily.    . calcium carbonate (OS-CAL) 600 MG TABS tablet Take 600 mg by mouth daily.    . cetirizine (ZYRTEC) 10 MG tablet Take 10 mg by mouth at bedtime.     . fluticasone (FLONASE) 50 MCG/ACT nasal spray Place 1 spray into both nostrils daily as needed.    . folic acid (FOLVITE) 1 MG tablet Take 1 mg by mouth every morning.     . Lactobacillus (ACIDOPHILUS) 100 MG CAPS Take 100 mg by mouth every morning.     . Magnesium 400 MG TABS Take 1 tablet by mouth daily.    . Multiple Vitamins-Minerals (WOMENS MULTIVITAMIN PO) Take 1 tablet by mouth daily.    Marland Kitchen oseltamivir (TAMIFLU) 75 MG capsule TAKE 1 CAPSULE BY MOUTH EVERY DAY FOR 10 DAYS  0  . pantoprazole (PROTONIX) 40 MG tablet Take 1 tablet (40 mg total) by mouth 2 (two) times daily. 60 tablet 0  . senna-docusate (SENOKOT-S) 8.6-50 MG tablet Take 1 tablet by mouth 2 (two) times daily. (Patient taking differently: Take 2 tablets by mouth at bedtime. ) 60 tablet 0  . simvastatin (ZOCOR)  40 MG tablet Take 20 mg by mouth daily.    Marland Kitchen SYNTHROID 100 MCG tablet Take 100 mcg by mouth daily before breakfast.     . torsemide (DEMADEX) 20 MG tablet Take 1 tablet (20 mg total) by mouth 2 (two) times daily.    . traZODone (DESYREL) 50 MG tablet Take 100 mg by mouth at bedtime.     . triamcinolone cream (KENALOG) 0.1 % Apply 1 application topically 2 (two) times daily.  2  . Turmeric 500 MG TABS Take 500 mg by mouth every morning.     . venlafaxine (EFFEXOR) 75 MG tablet Take 75 mg by mouth daily.     Marland Kitchen levothyroxine (SYNTHROID, LEVOTHROID) 88 MCG tablet Take 88 mcg by mouth every morning.  1   No current facility-administered medications for this visit.     Allergies:   Patient has no known allergies.    ROS:  Please see the history of present illness.   Otherwise,  review of systems are positive for none.   All other systems are reviewed and negative.    PHYSICAL EXAM: VS:  BP 117/68 (BP Location: Right Arm, Patient Position: Sitting, Cuff Size: Normal)   Pulse 77   Ht 5\' 2"  (1.575 m)   Wt 157 lb 3.2 oz (71.3 kg)   BMI 28.75 kg/m   , BMI Body mass index is 28.75 kg/m.  GENERAL:  Well appearing NECK:  No jugular venous distention, waveform within normal limits, carotid upstroke brisk and symmetric, no bruits, no thyromegaly LUNGS:  Clear CHEST:  Unremarkable HEART:  PMI not displaced or sustained,S1 and S2 within normal limits, no S3, no S4, no clicks, no rubs, 3/6 holosystolic murmur, no diastolic murmurs ABD:  Positive bowel sounds normal in frequency in pitch, no bruits, no rebound, no guarding, no midline pulsatile mass, no hepatomegaly, no splenomegaly, abdominal distension not tense. EXT:  2 plus pulses throughout, moderate edema, no cyanosis no clubbing   EKG:  EKG is Sinus rhythm, rate 77, axis within normal limits, intervals within normal limits, no acute ST-T wave changes.ordered today.    Recent Labs: 01/04/2017: BUN 37.6; Creatinine 1.6; Potassium 3.6; Sodium 143 02/02/2017: ALT 21; Hemoglobin 9.8; Platelets 188    Lipid Panel No results found for: CHOL, TRIG, HDL, CHOLHDL, VLDL, LDLCALC, LDLDIRECT    Wt Readings from Last 3 Encounters:  08/19/17 157 lb 3.2 oz (71.3 kg)  08/10/17 153 lb (69.4 kg)  07/13/17 157 lb (71.2 kg)      Other studies Reviewed: Additional studies/ records that were reviewed today include:   None Review of the above records demonstrates:     ASSESSMENT AND PLAN:   AORTIC STENOSIS:    This is mild and will be followed clinically.   TR:  She has moderate TR And elevated pulmonary pressures.  She likely has RV dysfunction significantly contributing to the pericardial effusion.  I do not think she is a reasonable candidate for right heart catheterization.  Further aggressive diuresis would lead to  increased renal insufficiency as he has in the past.  She might need paracentesis in the future if her abdomen gets tense but it is not currently.  The most important thing will be salt and fluid restriction and we had this discussion again.  Hopefully she will comply.  I will check a basic follow-up BME profile and CBC.  CHRONIC DIASTOLIC HF:   As above.    CKD II - III:  Her last creatinine was 1.4  I will check labs as above.  SLEEP APNEA:  She is now managed as above for this.     Current medicines are reviewed at length with the patient today.  The patient does not have concerns regarding medicines.  The following changes have been made: None  Labs/ tests ordered today include:   Orders Placed This Encounter  Procedures  . CBC  . Basic Metabolic Panel (BMET)  . EKG 12-Lead     Disposition:   FU with APP in 2 months.     Signed, Minus Breeding, MD  08/19/2017 4:57 PM    [facility]

## 2017-08-19 ENCOUNTER — Encounter: Payer: Self-pay | Admitting: Cardiology

## 2017-08-19 ENCOUNTER — Ambulatory Visit (INDEPENDENT_AMBULATORY_CARE_PROVIDER_SITE_OTHER): Payer: Medicare Other | Admitting: Cardiology

## 2017-08-19 VITALS — BP 117/68 | HR 77 | Ht 62.0 in | Wt 157.2 lb

## 2017-08-19 DIAGNOSIS — Z79899 Other long term (current) drug therapy: Secondary | ICD-10-CM | POA: Diagnosis not present

## 2017-08-19 DIAGNOSIS — I5033 Acute on chronic diastolic (congestive) heart failure: Secondary | ICD-10-CM | POA: Diagnosis not present

## 2017-08-19 DIAGNOSIS — R188 Other ascites: Secondary | ICD-10-CM | POA: Diagnosis not present

## 2017-08-19 NOTE — Patient Instructions (Signed)
Medication Instructions:  Continue current medications  If you need a refill on your cardiac medications before your next appointment, please call your pharmacy.  Labwork: CBC and BMP today  HERE IN OUR OFFICE AT [facility]  Take the provided lab slips for you to take with you to the lab for you blood draw.   You will NOT need to fast   Testing/Procedures: None Ordered  Follow-Up: Your physician wants you to follow-up in: 2 Months with Almyra Deforest.     Thank you for choosing [facility] at [facility]!!

## 2017-08-20 LAB — CBC
HEMATOCRIT: 31.4 % — AB (ref 34.0–46.6)
HEMOGLOBIN: 9.9 g/dL — AB (ref 11.1–15.9)
MCH: 31.6 pg (ref 26.6–33.0)
MCHC: 31.5 g/dL (ref 31.5–35.7)
MCV: 100 fL — ABNORMAL HIGH (ref 79–97)
Platelets: 189 10*3/uL (ref 150–379)
RBC: 3.13 x10E6/uL — ABNORMAL LOW (ref 3.77–5.28)
RDW: 15.7 % — ABNORMAL HIGH (ref 12.3–15.4)
WBC: 5.7 10*3/uL (ref 3.4–10.8)

## 2017-08-20 LAB — BASIC METABOLIC PANEL
BUN/Creatinine Ratio: 35 — ABNORMAL HIGH (ref 12–28)
BUN: 52 mg/dL — ABNORMAL HIGH (ref 8–27)
CALCIUM: 9.2 mg/dL (ref 8.7–10.3)
CO2: 28 mmol/L (ref 20–29)
CREATININE: 1.48 mg/dL — AB (ref 0.57–1.00)
Chloride: 104 mmol/L (ref 96–106)
GFR calc Af Amer: 36 mL/min/{1.73_m2} — ABNORMAL LOW (ref >59)
GFR, EST NON AFRICAN AMERICAN: 32 mL/min/{1.73_m2} — AB (ref >59)
Glucose: 108 mg/dL — ABNORMAL HIGH (ref 65–99)
Potassium: 4.2 mmol/L (ref 3.5–5.2)
SODIUM: 145 mmol/L — AB (ref 134–144)

## 2017-11-02 ENCOUNTER — Encounter: Payer: Self-pay | Admitting: Physician Assistant

## 2017-11-02 ENCOUNTER — Ambulatory Visit (INDEPENDENT_AMBULATORY_CARE_PROVIDER_SITE_OTHER): Payer: Medicare Other | Admitting: Physician Assistant

## 2017-11-02 VITALS — BP 110/58 | HR 77 | Ht 62.0 in | Wt 160.0 lb

## 2017-11-02 DIAGNOSIS — E119 Type 2 diabetes mellitus without complications: Secondary | ICD-10-CM

## 2017-11-02 DIAGNOSIS — N183 Chronic kidney disease, stage 3 unspecified: Secondary | ICD-10-CM

## 2017-11-02 DIAGNOSIS — I5032 Chronic diastolic (congestive) heart failure: Secondary | ICD-10-CM | POA: Diagnosis not present

## 2017-11-02 DIAGNOSIS — I350 Nonrheumatic aortic (valve) stenosis: Secondary | ICD-10-CM

## 2017-11-02 DIAGNOSIS — E039 Hypothyroidism, unspecified: Secondary | ICD-10-CM | POA: Diagnosis not present

## 2017-11-02 DIAGNOSIS — E785 Hyperlipidemia, unspecified: Secondary | ICD-10-CM | POA: Diagnosis not present

## 2017-11-02 DIAGNOSIS — R188 Other ascites: Secondary | ICD-10-CM | POA: Diagnosis not present

## 2017-11-02 DIAGNOSIS — I10 Essential (primary) hypertension: Secondary | ICD-10-CM | POA: Diagnosis not present

## 2017-11-02 DIAGNOSIS — G4733 Obstructive sleep apnea (adult) (pediatric): Secondary | ICD-10-CM | POA: Diagnosis not present

## 2017-11-02 NOTE — Progress Notes (Signed)
Cardiology Office Note    Date:  11/02/2017   ID:  JUBILEE VIVERO, DOB 1928/01/10, MRN [id number]  PCP:  Starlyn Skeans, PA-C  Cardiologist:  Dr. Percival Spanish   Chief Complaint  Patient presents with  . Follow-up    seen for Dr. Percival Spanish.     History of Present Illness:  Madison Riggs is a 84 y.o. female with PMH of CKD stage III, HTN, HLD, DM II, aortic stenosis, chronic diastolic heart failure, ascites, hypothyroidism and OSA on CPAP. She was hospitalized in May 2018 for anemia. Hemoglobin in the ED was 7.9 with positive Hemoccult. She required transfusion. EGD performed on 10/18/2016 was positive for reflux esophagitis, however no obvious source of bleeding. She did have some pulmonary edema on chest x-ray as well. Torsemide was decreased at discharge. Echocardiogram obtained on 10/17/2016 showed EF 60-65%, hypokinesis of the mid apical anterior septal myocardium, grade 2 DD, mild MS/AS, moderate TR, PA peak pressure 48 mmHg. Echocardiogram also noted ascites. CT of chest without contrast also noted partially visible abdominal ascites, possible liver cirrhosis and prior gastric bypass. Abdominal CT obtained on 01/05/2017 showed large volume of abdominal ascites, diffuse body wall edema consistent with anasarca, small pleural effusion and atelectasis in the right lung base, aortic atherosclerosis. She most recently underwent paracentesis on 02/01/2017 with removal of 2 L peritoneal fluid. Cytology was negative for malignancy. Although her albumin was occasionally low, however most of the times her albumin was normal. His last albumin on 02/02/2017 was 3.8 normal.  Patient presents today for cardiology office visit.  She was last seen by Dr. Percival Spanish in January 2019.  She continued to have 3+ pitting edema in bilateral lower extremity and abdominal distention.  This has not changed in the past few months.  She denies any significant shortness of breath and chest pain.  She will need a liver  function test given the recurrent ascites.  Unless ascites become worse, I would like to avoid increase the diuretic or repeat paracentesis.  She will need close outpatient follow-up with both primary care provider and cardiology service.  She has been instructed to cut back on salt, limit fluid intake to 1.5 L or less.  Her son is monitoring her weight on a daily basis, she has been instructed to contact cardiology if her weight increased by more than 3 pounds overnight or 5 pounds in a single week.   Past Medical History:  Diagnosis Date  . Aortic stenosis    moderate  . CKD (chronic kidney disease) stage 3, GFR 30-59 ml/min (HCC)   . Depression   . DM (diabetes mellitus), type 2 ([place])   . Gout   . HTN (hypertension)   . Hypercholesterolemia   . Hypothyroidism   . Iron deficiency anemia     Past Surgical History:  Procedure Laterality Date  . BACK SURGERY  1984  . CATARACT EXTRACTION, BILATERAL  1997  . CHOLECYSTECTOMY  2001  . ESOPHAGOGASTRODUODENOSCOPY N/A 10/18/2016   Procedure: ESOPHAGOGASTRODUODENOSCOPY (EGD);  Surgeon: Teena Irani, MD;  Location: [facility];  Service: Endoscopy;  Laterality: N/A;  . GASTRIC BYPASS  1999  . IR PARACENTESIS  02/09/2017  . KNEE SURGERY Left 1994  . THORACENTESIS Right    in [place], [place]  . TONSILLECTOMY      Current Medications: Outpatient Medications Prior to Visit  Medication Sig Dispense Refill  . acetaminophen (TYLENOL) 500 MG tablet Take 500 mg by mouth every 6 (six) hours as needed for moderate pain.    Marland Kitchen  allopurinol (ZYLOPRIM) 100 MG tablet Take 100 mg by mouth 2 (two) times daily.     Marland Kitchen b complex vitamins tablet Take 1 tablet by mouth daily.    . calcium carbonate (OS-CAL) 600 MG TABS tablet Take 600 mg by mouth daily.    . cetirizine (ZYRTEC) 10 MG tablet Take 10 mg by mouth at bedtime.     . fluticasone (FLONASE) 50 MCG/ACT nasal spray Place 1 spray into both nostrils daily as needed.    . folic acid (FOLVITE) 1 MG tablet Take  1 mg by mouth every morning.     . Lactobacillus (ACIDOPHILUS) 100 MG CAPS Take 100 mg by mouth every morning.     . Magnesium 400 MG TABS Take 1 tablet by mouth daily.    . Multiple Vitamins-Minerals (WOMENS MULTIVITAMIN PO) Take 1 tablet by mouth daily.    . pantoprazole (PROTONIX) 40 MG tablet Take 1 tablet (40 mg total) by mouth 2 (two) times daily. 60 tablet 0  . senna-docusate (SENOKOT-S) 8.6-50 MG tablet Take 1 tablet by mouth 2 (two) times daily. (Patient taking differently: Take 2 tablets by mouth at bedtime. ) 60 tablet 0  . simvastatin (ZOCOR) 40 MG tablet Take 20 mg by mouth daily.    Marland Kitchen SYNTHROID 100 MCG tablet Take 100 mcg by mouth daily before breakfast.     . torsemide (DEMADEX) 20 MG tablet Take 1 tablet (20 mg total) by mouth 2 (two) times daily.    . traZODone (DESYREL) 50 MG tablet Take 100 mg by mouth at bedtime.     . triamcinolone cream (KENALOG) 0.1 % Apply 1 application topically 2 (two) times daily.  2  . Turmeric 500 MG TABS Take 500 mg by mouth every morning.     . venlafaxine (EFFEXOR) 75 MG tablet Take 75 mg by mouth daily.     Marland Kitchen levothyroxine (SYNTHROID, LEVOTHROID) 88 MCG tablet Take 88 mcg by mouth every morning.  1   No facility-administered medications prior to visit.      Allergies:   Patient has no known allergies.   Social History   Socioeconomic History  . Marital status: Widowed    Spouse name: Not on file  . Number of children: Not on file  . Years of education: Not on file  . Highest education level: Not on file  Occupational History  . Not on file  Social Needs  . Financial resource strain: Not on file  . Food insecurity:    Worry: Not on file    Inability: Not on file  . Transportation needs:    Medical: Not on file    Non-medical: Not on file  Tobacco Use  . Smoking status: Never Smoker  . Smokeless tobacco: Never Used  Substance and Sexual Activity  . Alcohol use: No  . Drug use: No  . Sexual activity: Not on file  Lifestyle    . Physical activity:    Days per week: Not on file    Minutes per session: Not on file  . Stress: Not on file  Relationships  . Social connections:    Talks on phone: Not on file    Gets together: Not on file    Attends religious service: Not on file    Active member of club or organization: Not on file    Attends meetings of clubs or organizations: Not on file    Relationship status: Not on file  Other Topics Concern  . Not on file  Social History Narrative   Widowed   Lives with son and daughter-in-law   [name] here from [place] 03/25/2016   2 sons   Occupation: retired, was a Museum/gallery conservator with [employer]     Family History:  The patient's family history includes Breast cancer in her mother; Colon cancer in her father; Emphysema in her father; Prostate cancer in her father.   ROS:   Please see the history of present illness.    ROS All other systems reviewed and are negative.   PHYSICAL EXAM:   VS:  BP (!) 110/58   Pulse 77   Ht 5\' 2"  (1.575 m)   Wt 160 lb (72.6 kg)   BMI 29.26 kg/m    GEN: Well nourished, well developed, in no acute distress  HEENT: normal  Neck: no JVD, carotid bruits, or masses Cardiac: RRR; no rubs, or gallops. 3+ edema, 3 out of 6 systolic murmur near the right upper sternal border Respiratory:  clear to auscultation bilaterally, normal work of breathing GI: soft, nontender, + BS  +distended MS: no deformity or atrophy  Skin: warm and dry, no rash Neuro:  Alert and Oriented x 3, Strength and sensation are intact Psych: euthymic mood, full affect  Wt Readings from Last 3 Encounters:  11/02/17 160 lb (72.6 kg)  08/19/17 157 lb 3.2 oz (71.3 kg)  08/10/17 153 lb (69.4 kg)      Studies/Labs Reviewed:   EKG:  EKG is not ordered today.   Recent Labs: 02/02/2017: ALT 21 08/19/2017: BUN 52; Creatinine, Ser 1.48; Hemoglobin 9.9; Platelets 189; Potassium 4.2; Sodium 145   Lipid Panel No results found for: CHOL, TRIG, HDL, CHOLHDL,  VLDL, LDLCALC, LDLDIRECT  Additional studies/ records that were reviewed today include:   Echo 10/17/2016 Study Conclusions  - Left ventricle: The cavity size was normal. Systolic function was   normal. The estimated ejection fraction was in the range of 60%   to 65%. Hypokinesis of the mid-apicalanteroseptal myocardium.   Features are consistent with a pseudonormal left ventricular   filling pattern, with concomitant abnormal relaxation and   increased filling pressure (grade 2 diastolic dysfunction).   Doppler parameters are consistent with high ventricular filling   pressure. - Ventricular septum: Septal motion showed &quot;bounce&quot;. The contour   showed diastolic flattening consistent with right ventricular   volume overload. - Aortic valve: There was mild stenosis. There was no   regurgitation. Valve area (VTI): 1.1 cm^2. Valve area (Vmax): 1.1   cm^2. Valve area (Vmean): 0.96 cm^2. - Mitral valve: Severely calcified annulus. Calcification.   Thickening. The findings are consistent with mild stenosis. There   was trivial regurgitation. Valve area by continuity equation   (using LVOT flow): 1.74 cm^2. - Left atrium: The atrium was severely dilated. - Right ventricle: The cavity size was normal. Wall thickness was   normal. Systolic function was normal. - Atrial septum: No defect or patent foramen ovale was identified   by color flow Doppler. - Tricuspid valve: There was moderate regurgitation. - Pulmonary arteries: Systolic pressure was mildly to moderately   increased. PA peak pressure: 48 mm Hg (S). - Pericardium, extracardiac: Ascites was noted.    ASSESSMENT:    1. Chronic diastolic heart failure ([place])   2. CKD (chronic kidney disease), stage III ([place])   3. Essential hypertension   4. Hyperlipidemia, unspecified hyperlipidemia type   5. Controlled type 2 diabetes mellitus without complication, without long-term current use of insulin ([place])   6. Aortic  valve  stenosis, etiology of cardiac valve disease unspecified   7. Hypothyroidism, unspecified type   8. Other ascites   9. OSA (obstructive sleep apnea)      PLAN:  In order of problems listed above:  1. Chronic diastolic heart failure: She continued to have 3+ pitting edema in bilateral lower extremity, will continue on the current torsemide, she likely will always have some degree of lower extremity edema.  This is been controlled with compression stocking.  She need to cut back on salt intake.  She also need to limit fluid intake to less than 1.5 L/day.  I am hesitant to increase her diuretic as a a may trigger further renal insufficiency  2. Ascites: Her abdomen is distended, however this has not changed in the past few months, she already had paracentesis in August of last year.  If her abdomen become more distended, she may ended up needing another paracentesis.  I would recommend a liver function test to follow-up on her liver issue.  3. Hypertension: blood pressure stable  4. Hyperlipidemia: On Zocor 20 mg daily.  Need a liver function test to follow-up.  5. DM 2: Managed by primary care provider  6. CKD stage III: Renal function seems to be stable on the current dose of 20 mg twice daily of torsemide.  She has been instructed to take additional 20 mg on a as needed basis if her weight increased by more than 3 pounds overnight or 5 pounds in single week.  7. Hypothyroidism: Managed by primary care provider  8. Aortic stenosis: Although last years echocardiogram suggested mild aortic stenosis, physical exam shows that her aortic murmur is louder than expected.  I would recommend repeat echocardiogram later this year or next year.    Medication Adjustments/Labs and Tests Ordered: Current medicines are reviewed at length with the patient today.  Concerns regarding medicines are outlined above.  Medication changes, Labs and Tests ordered today are listed in the Patient Instructions  below. Patient Instructions  Medication Instructions:  Your physician recommends that you continue on your current medications as directed. Please refer to the Current Medication list given to you today.  Labwork: Lab order will be give have Liver Function Test done with Hematologist  Testing/Procedures: None   Follow-Up: Your physician recommends that you schedule a follow-up appointment in: 4 months with Dr Percival Spanish.  Any Other Special Instructions Will Be Listed Below (If Applicable). KEEP WOUND CLEAN AND DRY TO PREVENT INFECTION If you need a refill on your cardiac medications before your next appointment, please call your pharmacy.     Hilbert Corrigan, [place]  11/02/2017 1:36 PM    [facility] [address], [place], [place]  [address] Phone: [phone]; Fax: [phone]

## 2017-11-02 NOTE — Patient Instructions (Addendum)
Medication Instructions:  Your physician recommends that you continue on your current medications as directed. Please refer to the Current Medication list given to you today.  Labwork: Lab order will be give have Liver Function Test done with Hematologist  Testing/Procedures: None   Follow-Up: Your physician recommends that you schedule a follow-up appointment in: 4 months with Dr Percival Spanish.  Any Other Special Instructions Will Be Listed Below (If Applicable). KEEP WOUND CLEAN AND DRY TO PREVENT INFECTION If you need a refill on your cardiac medications before your next appointment, please call your pharmacy.

## 2017-12-01 ENCOUNTER — Encounter (HOSPITAL_COMMUNITY)
Admission: RE | Admit: 2017-12-01 | Discharge: 2017-12-01 | Disposition: A | Discharge status: Home / Self Care | Payer: Medicare Other | Source: Ambulatory Visit | Attending: Internal Medicine | Admitting: Internal Medicine

## 2017-12-01 DIAGNOSIS — D509 Iron deficiency anemia, unspecified: Secondary | ICD-10-CM | POA: Insufficient documentation

## 2017-12-01 DIAGNOSIS — Z9884 Bariatric surgery status: Secondary | ICD-10-CM | POA: Insufficient documentation

## 2017-12-01 MED ORDER — SODIUM CHLORIDE 0.9 % IV SOLN
510.0000 mg | Freq: Once | INTRAVENOUS | Status: DC
  Administered 2017-12-01: 08:00:00 510 mg via INTRAVENOUS
  Filled 2017-12-01: qty 17

## 2017-12-30 ENCOUNTER — Ambulatory Visit (HOSPITAL_COMMUNITY)
Admission: RE | Admit: 2017-12-30 | Discharge: 2017-12-30 | Disposition: A | Discharge status: Home / Self Care | Payer: Medicare Other | Source: Ambulatory Visit | Attending: Internal Medicine | Admitting: Internal Medicine

## 2017-12-30 DIAGNOSIS — D509 Iron deficiency anemia, unspecified: Secondary | ICD-10-CM | POA: Diagnosis not present

## 2017-12-30 DIAGNOSIS — Z9884 Bariatric surgery status: Secondary | ICD-10-CM | POA: Diagnosis not present

## 2017-12-30 MED ORDER — SODIUM CHLORIDE 0.9 % IV SOLN
510.0000 mg | Freq: Once | INTRAVENOUS | Status: AC
  Administered 2017-12-30: 510 mg via INTRAVENOUS
  Filled 2017-12-30: qty 17

## 2018-02-03 ENCOUNTER — Encounter: Payer: Self-pay | Admitting: Cardiology

## 2018-02-07 ENCOUNTER — Other Ambulatory Visit (HOSPITAL_COMMUNITY): Payer: Self-pay | Admitting: *Deleted

## 2018-02-08 ENCOUNTER — Ambulatory Visit (HOSPITAL_COMMUNITY)
Admission: RE | Admit: 2018-02-08 | Discharge: 2018-02-08 | Disposition: A | Discharge status: Home / Self Care | Payer: Medicare Other | Source: Ambulatory Visit | Attending: Nephrology | Admitting: Nephrology

## 2018-02-08 DIAGNOSIS — N189 Chronic kidney disease, unspecified: Secondary | ICD-10-CM | POA: Insufficient documentation

## 2018-02-08 DIAGNOSIS — D631 Anemia in chronic kidney disease: Secondary | ICD-10-CM | POA: Diagnosis not present

## 2018-02-08 MED ORDER — FUROSEMIDE 10 MG/ML IJ SOLN
160.0000 mg | Freq: Once | INTRAMUSCULAR | Status: AC
  Administered 2018-02-08: 11:00:00 160 mg via INTRAVENOUS
  Filled 2018-02-08: qty 10

## 2018-02-08 MED ORDER — SODIUM CHLORIDE 0.9 % IV SOLN
510.0000 mg | Freq: Once | INTRAVENOUS | Status: AC
  Administered 2018-02-08: 510 mg via INTRAVENOUS
  Filled 2018-02-08: qty 17

## 2018-02-14 ENCOUNTER — Encounter (HOSPITAL_BASED_OUTPATIENT_CLINIC_OR_DEPARTMENT_OTHER): Payer: Self-pay | Admitting: Emergency Medicine

## 2018-02-14 ENCOUNTER — Other Ambulatory Visit: Payer: Self-pay

## 2018-02-14 ENCOUNTER — Emergency Department (HOSPITAL_BASED_OUTPATIENT_CLINIC_OR_DEPARTMENT_OTHER): Payer: Medicare Other

## 2018-02-14 ENCOUNTER — Emergency Department (HOSPITAL_BASED_OUTPATIENT_CLINIC_OR_DEPARTMENT_OTHER)
Admission: EM | Admit: 2018-02-14 | Discharge: 2018-02-14 | Disposition: A | Discharge status: Home / Self Care | Payer: Medicare Other | Attending: Emergency Medicine | Admitting: Emergency Medicine

## 2018-02-14 DIAGNOSIS — W19XXXA Unspecified fall, initial encounter: Secondary | ICD-10-CM | POA: Insufficient documentation

## 2018-02-14 DIAGNOSIS — Y999 Unspecified external cause status: Secondary | ICD-10-CM | POA: Insufficient documentation

## 2018-02-14 DIAGNOSIS — E039 Hypothyroidism, unspecified: Secondary | ICD-10-CM | POA: Insufficient documentation

## 2018-02-14 DIAGNOSIS — S59801A Other specified injuries of right elbow, initial encounter: Secondary | ICD-10-CM | POA: Insufficient documentation

## 2018-02-14 DIAGNOSIS — Y9389 Activity, other specified: Secondary | ICD-10-CM | POA: Diagnosis not present

## 2018-02-14 DIAGNOSIS — Z79899 Other long term (current) drug therapy: Secondary | ICD-10-CM | POA: Diagnosis not present

## 2018-02-14 DIAGNOSIS — S0181XA Laceration without foreign body of other part of head, initial encounter: Secondary | ICD-10-CM

## 2018-02-14 DIAGNOSIS — Z23 Encounter for immunization: Secondary | ICD-10-CM | POA: Diagnosis not present

## 2018-02-14 DIAGNOSIS — Y92002 Bathroom of unspecified non-institutional (private) residence single-family (private) house as the place of occurrence of the external cause: Secondary | ICD-10-CM | POA: Insufficient documentation

## 2018-02-14 DIAGNOSIS — I5032 Chronic diastolic (congestive) heart failure: Secondary | ICD-10-CM | POA: Insufficient documentation

## 2018-02-14 DIAGNOSIS — E1122 Type 2 diabetes mellitus with diabetic chronic kidney disease: Secondary | ICD-10-CM | POA: Insufficient documentation

## 2018-02-14 DIAGNOSIS — N183 Chronic kidney disease, stage 3 (moderate): Secondary | ICD-10-CM | POA: Diagnosis not present

## 2018-02-14 DIAGNOSIS — S59901A Unspecified injury of right elbow, initial encounter: Secondary | ICD-10-CM

## 2018-02-14 DIAGNOSIS — S098XXA Other specified injuries of head, initial encounter: Secondary | ICD-10-CM | POA: Diagnosis present

## 2018-02-14 DIAGNOSIS — I1310 Hypertensive heart and chronic kidney disease without heart failure, with stage 1 through stage 4 chronic kidney disease, or unspecified chronic kidney disease: Secondary | ICD-10-CM | POA: Insufficient documentation

## 2018-02-14 MED ORDER — TETANUS-DIPHTH-ACELL PERTUSSIS 5-2.5-18.5 LF-MCG/0.5 IM SUSP
0.5000 mL | Freq: Once | INTRAMUSCULAR | Status: AC
  Administered 2018-02-14: 0.5 mL via INTRAMUSCULAR
  Filled 2018-02-14: qty 0.5

## 2018-02-14 NOTE — Discharge Instructions (Signed)
Wear the sling on the right arm for a week until you get repeat x-rays.  Do not put any weight on the arm.  You can still bathe and wash your face just do not scrub or put Neosporin on the Dermabond.

## 2018-02-14 NOTE — ED Provider Notes (Signed)
[facility] EMERGENCY DEPARTMENT Provider Note   CSN: [id number] Arrival date & time: 02/14/18  1613     History   Chief Complaint Chief Complaint  Patient presents with  . Fall    HPI Madison Riggs is a 84 y.o. female.  Patient is an 84 year old female with a history of chronic kidney disease, diabetes, anemia requiring iron transfusions, aortic stenosis who presents today after a mechanical fall at home.  She states she was walking into the bathroom when she started falling and fell face first onto the tile floor.  She denies feeling dizzy, weak or lightheaded prior to the episode.  She has not had any chest pain, shortness of breath or abdominal pain.  At no point did she lose consciousness.  Family members who also live at the home with her ran to her immediately and stated she was at her baseline mental status.  They called paramedics to come help him get her up because they did not want to injure her.  She denies any back pain or leg pain.  She was able to ambulate without difficulty or pain in her legs.  Tetanus shot is unknown.  The history is provided by the patient and a caregiver.  Fall  This is a new problem. The current episode started 1 to 2 hours ago. The problem occurs constantly. The problem has not changed since onset.Pertinent negatives include no chest pain, no abdominal pain, no headaches and no shortness of breath. Associated symptoms comments: Pain in the right elbow with a skin tear as well as some pain over her right eyebrow.  She does not take anticoagulation.. Nothing aggravates the symptoms. Nothing relieves the symptoms. She has tried nothing for the symptoms.    Past Medical History:  Diagnosis Date  . Aortic stenosis    moderate  . CKD (chronic kidney disease) stage 3, GFR 30-59 ml/min (HCC)   . Depression   . DM (diabetes mellitus), type 2 ([place])   . Gout   . HTN (hypertension)   . Hypercholesterolemia   . Hypothyroidism   . Iron  deficiency anemia     Patient Active Problem List   Diagnosis Date Noted  . Chronic diastolic HF (heart failure) ([place]) 10/30/2016  . Abnormal EKG 10/30/2016  . Nonrheumatic aortic valve stenosis 10/30/2016  . Anemia 10/16/2016  . Acute renal failure (ARF) ([place]) 10/16/2016  . Hypertension 10/16/2016  . Type II diabetes mellitus with manifestations ([place]) 10/16/2016  . Dyspnea and respiratory abnormalities 06/02/2016    Past Surgical History:  Procedure Laterality Date  . BACK SURGERY  1984  . CATARACT EXTRACTION, BILATERAL  1997  . CHOLECYSTECTOMY  2001  . ESOPHAGOGASTRODUODENOSCOPY N/A 10/18/2016   Procedure: ESOPHAGOGASTRODUODENOSCOPY (EGD);  Surgeon: Teena Irani, MD;  Location: [facility];  Service: Endoscopy;  Laterality: N/A;  . GASTRIC BYPASS  1999  . IR PARACENTESIS  02/09/2017  . KNEE SURGERY Left 1994  . THORACENTESIS Right    in [place], [place]  . TONSILLECTOMY       OB History   None      Home Medications    Prior to Admission medications   Medication Sig Start Date End Date Taking? Authorizing Provider  acetaminophen (TYLENOL) 500 MG tablet Take 500 mg by mouth every 6 (six) hours as needed for moderate pain.    [provider]  allopurinol (ZYLOPRIM) 100 MG tablet Take 100 mg by mouth 2 (two) times daily.     [provider]  b complex vitamins tablet Take 1 tablet by mouth daily.    [provider]  calcium carbonate (OS-CAL) 600 MG TABS tablet Take 600 mg by mouth daily.    [provider]  cetirizine (ZYRTEC) 10 MG tablet Take 10 mg by mouth at bedtime.     [provider]  fluticasone (FLONASE) 50 MCG/ACT nasal spray Place 1 spray into both nostrils daily as needed. 11/18/16   [provider]  folic acid (FOLVITE) 1 MG tablet Take 1 mg by mouth every morning.     [provider]  Lactobacillus (ACIDOPHILUS) 100 MG CAPS Take 100 mg by mouth every morning.     [provider]  Magnesium  400 MG TABS Take 1 tablet by mouth daily.    [provider]  Multiple Vitamins-Minerals (WOMENS MULTIVITAMIN PO) Take 1 tablet by mouth daily.    [provider]  pantoprazole (PROTONIX) 40 MG tablet Take 1 tablet (40 mg total) by mouth 2 (two) times daily. 10/19/16   Doreatha Lew, MD  senna-docusate (SENOKOT-S) 8.6-50 MG tablet Take 1 tablet by mouth 2 (two) times daily. Patient taking differently: Take 2 tablets by mouth at bedtime.  10/19/16   Doreatha Lew, MD  simvastatin (ZOCOR) 40 MG tablet Take 20 mg by mouth daily.    [provider]  SYNTHROID 100 MCG tablet Take 100 mcg by mouth daily before breakfast.  07/12/17   [provider]  torsemide (DEMADEX) 20 MG tablet Take 1 tablet (20 mg total) by mouth 2 (two) times daily. 10/19/16   Doreatha Lew, MD  traZODone (DESYREL) 50 MG tablet Take 100 mg by mouth at bedtime.     [provider]  triamcinolone cream (KENALOG) 0.1 % Apply 1 application topically 2 (two) times daily. 03/23/17   [provider]  Turmeric 500 MG TABS Take 500 mg by mouth every morning.     [provider]  venlafaxine (EFFEXOR) 75 MG tablet Take 75 mg by mouth daily.     [provider]    Family History Family History  Problem Relation Age of Onset  . Breast cancer Mother   . Colon cancer Father   . Prostate cancer Father   . Emphysema Father     Social History Social History   Tobacco Use  . Smoking status: Never Smoker  . Smokeless tobacco: Never Used  Substance Use Topics  . Alcohol use: No  . Drug use: No     Allergies   Patient has no known allergies.   Review of Systems Review of Systems  Respiratory: Negative for shortness of breath.   Cardiovascular: Negative for chest pain.  Gastrointestinal: Negative for abdominal pain.  Neurological: Negative for headaches.  All other systems reviewed and are negative.    Physical Exam Updated Vital Signs BP  118/68   Pulse 91   Temp 98.6 F (37 C)   Resp 16   Ht 5\' 3"  (1.6 m)   Wt 71.7 kg   SpO2 95%   BMI 27.99 kg/m   Physical Exam  Constitutional: She is oriented to person, place, and time. She appears well-developed and well-nourished. No distress.  HENT:  Head: Normocephalic. Head is with contusion and with laceration.    Mouth/Throat: Oropharynx is clear and moist.  Eyes: Pupils are equal, round, and reactive to light. Conjunctivae and EOM are normal.  Neck: Normal range of motion. Neck supple.  Cardiovascular: Normal rate, regular rhythm and intact  distal pulses.  No murmur heard. Pulmonary/Chest: Effort normal and breath sounds normal. No respiratory distress. She has no wheezes. She has no rales.  Abdominal: Soft. She exhibits no distension. There is no tenderness. There is no rebound and no guarding.  Genitourinary:  Genitourinary Comments: No visible bleeding hemorrhoids  Musculoskeletal: Normal range of motion. She exhibits tenderness. She exhibits no edema.       Right hip: Normal.       Left hip: Normal.       Right knee: Normal.       Left knee: Normal.       Arms: Bruising in various stages of healing on bilateral forearms  Neurological: She is alert and oriented to person, place, and time.  Skin: Skin is warm and dry. No rash noted. No erythema.  Psychiatric: She has a normal mood and affect. Her behavior is normal.  Nursing note and vitals reviewed.    ED Treatments / Results  Labs (all labs ordered are listed, but only abnormal results are displayed) Labs Reviewed - No data to display  EKG None  Radiology Dg Elbow Complete Right  Result Date: 02/14/2018 CLINICAL DATA:  Pain after a fall EXAM: RIGHT ELBOW - COMPLETE 3+ VIEW COMPARISON:  None. FINDINGS: No definitive fracture lucency. No dislocation. Suspected small elbow effusion. IMPRESSION: No definitive fracture lucency seen however suspicion of small elbow effusion. Consider short interval  radiographic follow-up to exclude occult fracture Electronically Signed   By: Donavan Foil M.D.   On: 02/14/2018 17:53   Ct Head Wo Contrast  Result Date: 02/14/2018 CLINICAL DATA:  Unwitnessed fall today.  Abrasion above right eye. EXAM: CT HEAD WITHOUT CONTRAST TECHNIQUE: Contiguous axial images were obtained from the base of the skull through the vertex without intravenous contrast. COMPARISON:  Report from 07/03/1998 FINDINGS: Brain: Involutional changes of the brain with sulcal and ventricular prominence. Chronic appearing small vessel ischemic disease of periventricular white matter. No acute large vascular territory infarction, hemorrhage, midline shift or edema. Midline fourth ventricle basal cisterns. Intact brainstem and cerebellum. No intra-axial mass nor extra-axial fluid collections. Vascular: No hyperdense vessel sign. Moderate atherosclerosis of the carotid siphons bilaterally. Skull: No acute skull fracture. Sinuses/Orbits: Bilateral cataract extractions. Clear paranasal sinuses. Clear mastoids. Other: Mild right supraorbital soft tissue swelling. IMPRESSION: 1. Mild right supraorbital soft tissue swelling without underlying skull fracture. 2. Chronic appearing small vessel ischemic disease with involutional changes of the brain, likely age related. 3. No acute intracranial abnormality. Electronically Signed   By: Ashley Royalty M.D.   On: 02/14/2018 17:49    Procedures Procedures (including critical care time) LACERATION REPAIR Performed by: [employer] Authorized by: Blanchie Dessert Consent: Verbal consent obtained. Risks and benefits: risks, benefits and alternatives were discussed Consent given by: patient Patient identity confirmed: provided demographic data Prepped and Draped in normal sterile fashion Wound explored  Laceration Location: right eyebrow  Laceration Length: 1cm  No Foreign Bodies seen or palpated  Anesthesia: local infiltration  Local anesthetic:  none Irrigation method: syringe Amount of cleaning: standard  Skin closure: dermabond   Patient tolerance: Patient tolerated the procedure well with no immediate complications.   Medications Ordered in ED Medications  Tdap (BOOSTRIX) injection 0.5 mL (0.5 mLs Intramuscular Given 02/14/18 1714)     Initial Impression / Assessment and Plan / ED Course  I have reviewed the triage vital signs and the nursing notes.  Pertinent labs & imaging results that were available during my care of the patient  were reviewed by me and considered in my medical decision making (see chart for details).     Elderly female with mechanical fall today resulting in injury to her right elbow with a skin tear and a small superficial laceration over the right eyebrow.  She has no visual changes, loss of consciousness or concern for intracranial injury.  She does not take anticoagulation.  Patient's tetanus shot was updated.  Head CT was within normal limits.  Elbow films were within normal limits.  Wound repaired with Dermabond as above. Also patient was supposed to be seen by her PCP tomorrow to check to see if she had hemorrhoids.  I did this exam and patient does not appear to have any bleeding hemorrhoids at this time.  Possibility of having internal hemorrhoids but will give Anusol suppository.  Patient does strain regularly with bowel movements. 6:10 PM CT is negative for acute intracranial injury.  Right elbow imaging shows an effusion concerning for possible occult fracture but no fracture is seen.  Findings discussed with the patient and her son.  She was placed in a sling and to have a repeat x-ray done in 1 week.    Final Clinical Impressions(s) / ED Diagnoses   Final diagnoses:  Fall, initial encounter  Facial laceration, initial encounter  Elbow injury, right, initial encounter    ED Discharge Orders    None       Blanchie Dessert, MD 02/14/18 1813

## 2018-02-14 NOTE — ED Triage Notes (Signed)
Unwitnessed fall PTA.  Laceration above right eye, skin tear to right arm, alert and oriented.  Denies LOC.  No blood thinners.

## 2018-02-26 NOTE — Progress Notes (Signed)
Cardiology Office Note   Date:  02/28/2018   ID:  Madison Riggs, DOB April 22, 1928, MRN [id number]  PCP:  Starlyn Skeans, PA-C  Cardiologist:   Minus Breeding, MD  Referring:  Starlyn Skeans, PA-[facility]  Chief Complaint  Patient presents with  . Aortic Stenosis      History of Present Illness: Madison Riggs is a 84 y.o. female who presents for evaluation of diastolic heart failure.  She presents for follow-up. Since I last saw her she continues to have problems with volume.  Her legs have increased swelling.  She has probably a little more abdominal distention.  She is gained a few pounds.  Her son says she does keep her feet talk in a recliner and they have a pillow under the him.  She is pretty good about this but she is not particularly good about fluid restriction.  She does not add salt but she might eat salty foods.  She gets breathless with activity such as walking quickly with her walker but she is not describing PND or orthopnea.  There is been no cough or fever.  She sleeps in a chair because of her reflux.  She does now have some weeping in her legs and blistering and she has not been putting on her compression stockings because of this.   Past Medical History:  Diagnosis Date  . Aortic stenosis    moderate  . CKD (chronic kidney disease) stage 3, GFR 30-59 ml/min (HCC)   . Depression   . DM (diabetes mellitus), type 2 ([place])   . Gout   . HTN (hypertension)   . Hypercholesterolemia   . Hypothyroidism   . Iron deficiency anemia   . Sleep apnea    CPAP    Past Surgical History:  Procedure Laterality Date  . BACK SURGERY  1984  . CATARACT EXTRACTION, BILATERAL  1997  . CHOLECYSTECTOMY  2001  . ESOPHAGOGASTRODUODENOSCOPY N/A 10/18/2016   Procedure: ESOPHAGOGASTRODUODENOSCOPY (EGD);  Surgeon: Teena Irani, MD;  Location: [facility];  Service: Endoscopy;  Laterality: N/A;  . GASTRIC BYPASS  1999  . IR PARACENTESIS  02/09/2017  . KNEE SURGERY Left 1994  .  THORACENTESIS Right    in [place], [place]  . TONSILLECTOMY       Current Outpatient Medications  Medication Sig Dispense Refill  . acetaminophen (TYLENOL) 500 MG tablet Take 500 mg by mouth every 6 (six) hours as needed for moderate pain.    Marland Kitchen allopurinol (ZYLOPRIM) 100 MG tablet Take 100 mg by mouth 2 (two) times daily.     . calcium carbonate (OS-CAL) 600 MG TABS tablet Take 600 mg by mouth daily.    . cetirizine (ZYRTEC) 10 MG tablet Take 10 mg by mouth at bedtime.     . Cyanocobalamin (VITAMIN B-12) 5000 MCG SUBL Place 5,000 mcg under the tongue daily.    . fluticasone (FLONASE) 50 MCG/ACT nasal spray Place 1 spray into both nostrils daily as needed.    . folic acid (FOLVITE) 1 MG tablet Take 1 mg by mouth every morning.     . Lactobacillus (ACIDOPHILUS) 100 MG CAPS Take 100 mg by mouth every morning.     . Magnesium 400 MG TABS Take 1 tablet by mouth daily.    . Multiple Vitamins-Minerals (WOMENS MULTIVITAMIN PO) Take 1 tablet by mouth daily.    . pantoprazole (PROTONIX) 40 MG tablet Take 1 tablet (40 mg total) by mouth 2 (two) times daily. 60 tablet 0  .  senna-docusate (SENOKOT-S) 8.6-50 MG tablet Take 1 tablet by mouth 2 (two) times daily. (Patient taking differently: Take 2 tablets by mouth at bedtime. ) 60 tablet 0  . simvastatin (ZOCOR) 40 MG tablet Take 20 mg by mouth daily.    Marland Kitchen SYNTHROID 100 MCG tablet Take 100 mcg by mouth daily before breakfast.     . torsemide (DEMADEX) 20 MG tablet Take 1 tablet (20 mg total) by mouth 2 (two) times daily. (Patient taking differently: Take 20 mg by mouth once. Alternates between 2-3 pills every other day)    . traZODone (DESYREL) 50 MG tablet Take 100 mg by mouth at bedtime.     . triamcinolone cream (KENALOG) 0.1 % Apply 1 application topically 2 (two) times daily.  2  . Turmeric 500 MG TABS Take 500 mg by mouth every morning.     . venlafaxine (EFFEXOR) 75 MG tablet Take 75 mg by mouth daily.     Marland Kitchen b complex vitamins tablet Take 1 tablet  by mouth daily.    . metolazone (ZAROXOLYN) 2.5 MG tablet Take 1 tablet (2.5 mg total) by mouth daily as needed. Take 56min before Torsemide 10 tablet 0   No current facility-administered medications for this visit.     Allergies:   Patient has no known allergies.    ROS:  Please see the history of present illness.   Otherwise, review of systems are positive for none.   All other systems are reviewed and negative.    PHYSICAL EXAM: VS:  BP 118/62   Pulse 86   Ht 5\' 2"  (1.575 m)   Wt 162 lb 6.4 oz (73.7 kg)   SpO2 97%   BMI 29.70 kg/m  , BMI Body mass index is 29.7 kg/m.  GEN:  No distress but frail NECK:  Positive jugular venous distention at 90 degrees, waveform within normal limits, carotid upstroke brisk and symmetric, no bruits, no thyromegaly LYMPHATICS:  No cervical adenopathy LUNGS:  Clear to auscultation bilaterally BACK:  No CVA tenderness CHEST:  Unremarkable HEART:  S1 and S2 within normal limits, no S3, no S4, no clicks, no rubs, 3 out of 6 holosystolic murmur, no diastolic murmurs ABD:  Positive bowel sounds normal in frequency in pitch, no bruits, no rebound, no guarding, unable to assess midline mass or bruit with the patient seated.  Her abdomen is distended but not taut EXT:  2 plus pulses throughout, severe to the thighs edema, no cyanosis no clubbing SKIN:  No rashes no nodules NEURO:  Cranial nerves II through XII grossly intact, motor grossly intact throughout PSYCH:  Cognitively intact, oriented to person place and time   EKG:  EKG is not ordered today.    Recent Labs: 08/19/2017: BUN 52; Creatinine, Ser 1.48; Hemoglobin 9.9; Platelets 189; Potassium 4.2; Sodium 145    Lipid Panel No results found for: CHOL, TRIG, HDL, CHOLHDL, VLDL, LDLCALC, LDLDIRECT    Wt Readings from Last 3 Encounters:  02/28/18 162 lb 6.4 oz (73.7 kg)  02/14/18 158 lb (71.7 kg)  12/01/17 159 lb (72.1 kg)      Other studies Reviewed: Additional studies/ records that were  reviewed today include:   Labs Review of the above records demonstrates:     ASSESSMENT AND PLAN:   AORTIC STENOSIS:     This was mild and will be followed clinically.  This is mild and will be followed clinically.   TR: She has moderate TR and elevated pulmonary pressures.  She has moderate  TR And elevated pulmonary pressures.  We are trying to treated this conservatively.  She is frail and would like to avoid right heart catheterization.    CHRONIC DIASTOLIC HF:     The plan will be continued attempts of medical management.  She needs better fluid restriction and continued salt restriction.  She is going to take her torsemide 20 mg tablets 3 times a day for the next 3 days.  I am going to add 2 days of 2.5 mg Zaroxolyn.  She will then get a basic metabolic profile.  I need to see her back in 1 week.  If she does not improve she understands would likely be hospitalized for to optimize her fluid management.   CKD II - III:    I will follow the creatinine in about 3 or 4 days.  She will have to have this done at her primary care office.  She is given written instructions for this.  SLEEP APNEA:   She is wearing CPAP.  Current medicines are reviewed at length with the patient today.  The patient does not have concerns regarding medicines.  The following changes have been made: As above.   Labs/ tests ordered today include:    Orders Placed This Encounter  Procedures  . Basic metabolic panel  . Iron     Disposition:   FU with me or APP next week.   Signed, Minus Breeding, MD  02/28/2018 10:50 AM    [place]

## 2018-02-28 ENCOUNTER — Telehealth: Payer: Self-pay

## 2018-02-28 ENCOUNTER — Encounter: Payer: Self-pay | Admitting: Cardiology

## 2018-02-28 ENCOUNTER — Ambulatory Visit (INDEPENDENT_AMBULATORY_CARE_PROVIDER_SITE_OTHER): Payer: Medicare Other | Admitting: Cardiology

## 2018-02-28 VITALS — BP 118/62 | HR 86 | Ht 62.0 in | Wt 162.4 lb

## 2018-02-28 DIAGNOSIS — I5032 Chronic diastolic (congestive) heart failure: Secondary | ICD-10-CM

## 2018-02-28 DIAGNOSIS — D508 Other iron deficiency anemias: Secondary | ICD-10-CM

## 2018-02-28 DIAGNOSIS — R601 Generalized edema: Secondary | ICD-10-CM | POA: Diagnosis not present

## 2018-02-28 DIAGNOSIS — N184 Chronic kidney disease, stage 4 (severe): Secondary | ICD-10-CM | POA: Diagnosis not present

## 2018-02-28 MED ORDER — METOLAZONE 2.5 MG PO TABS: 2.5000 mg | ORAL_TABLET | Freq: Every day | ORAL | 0 refills | Status: DC | PRN

## 2018-02-28 NOTE — Patient Instructions (Addendum)
Medication Instructions:  Your physician has recommended you make the following change in your medication:  1) TAKE Zaroxolyn 2.5mg  30 minutes before Torsemide the next 2 days.  2) INCREASE Torsemide to 3 tabs (60mg ) daily for the next 3 days.     Labwork: Please have a BMET drawn at your primary care physicians office on Thursday 03/03/18  Testing/Procedures: None ordered  Follow-Up: Your physician recommends that you schedule a follow-up appointment in: 1 week with Dr.Hochrein or Almyra Deforest, PA   Any Other Special Instructions Will Be Listed Below (If Applicable). Please wrap your legs with ace bandage for compression.  Limit your sodium intake to 2g (2,000mg ) daily  Limit your fluid intake to 1500cc (1.5 L)     If you need a refill on your cardiac medications before your next appointment, please call your pharmacy.   Two Gram Sodium Diet 2000 mg  What is Sodium? Sodium is a mineral found naturally in many foods. The most significant source of sodium in the diet is table salt, which is about 40% sodium.  Processed, convenience, and preserved foods also contain a large amount of sodium.  The body needs only 500 mg of sodium daily to function,  A normal diet provides more than enough sodium even if you do not use salt.  Why Limit Sodium? A build up of sodium in the body can cause thirst, increased blood pressure, shortness of breath, and water retention.  Decreasing sodium in the diet can reduce edema and risk of heart attack or stroke associated with high blood pressure.  Keep in mind that there are many other factors involved in these health problems.  Heredity, obesity, lack of exercise, cigarette smoking, stress and what you eat all play a role.  General Guidelines:  Do not add salt at the table or in cooking.  One teaspoon of salt contains over 2 grams of sodium.  Read food labels  Avoid processed and convenience foods  Ask your dietitian before eating any foods not  dicussed in the menu planning guidelines  Consult your physician if you wish to use a salt substitute or a sodium containing medication such as antacids.  Limit milk and milk products to 16 oz (2 cups) per day.  Shopping Hints:  READ LABELS!! "Dietetic" does not necessarily mean low sodium.  Salt and other sodium ingredients are often added to foods during processing.   Menu Planning Guidelines Food Group Choose More Often Avoid  Beverages (see also the milk group All fruit juices, low-sodium, salt-free vegetables juices, low-sodium carbonated beverages Regular vegetable or tomato juices, commercially softened water used for drinking or cooking  Breads and Cereals Enriched white, wheat, rye and pumpernickel bread, hard rolls and dinner rolls; muffins, cornbread and waffles; most dry cereals, cooked cereal without added salt; unsalted crackers and breadsticks; low sodium or homemade bread crumbs Bread, rolls and crackers with salted tops; quick breads; instant hot cereals; pancakes; commercial bread stuffing; self-rising flower and biscuit mixes; regular bread crumbs or cracker crumbs  Desserts and Sweets Desserts and sweets mad with mild should be within allowance Instant pudding mixes and cake mixes  Fats Butter or margarine; vegetable oils; unsalted salad dressings, regular salad dressings limited to 1 Tbs; light, sour and heavy cream Regular salad dressings containing bacon fat, bacon bits, and salt pork; snack dips made with instant soup mixes or processed cheese; salted nuts  Fruits Most fresh, frozen and canned fruits Fruits processed with salt or sodium-containing ingredient (some dried fruits  are processed with sodium sulfites        Vegetables Fresh, frozen vegetables and low- sodium canned vegetables Regular canned vegetables, sauerkraut, pickled vegetables, and others prepared in brine; frozen vegetables in sauces; vegetables seasoned with ham, bacon or salt pork  Condiments,  Sauces, Miscellaneous  Salt substitute with physician's approval; pepper, herbs, spices; vinegar, lemon or lime juice; hot pepper sauce; garlic powder, onion powder, low sodium soy sauce (1 Tbs.); low sodium condiments (ketchup, chili sauce, mustard) in limited amounts (1 tsp.) fresh ground horseradish; unsalted tortilla chips, pretzels, potato chips, popcorn, salsa (1/4 cup) Any seasoning made with salt including garlic salt, celery salt, onion salt, and seasoned salt; sea salt, rock salt, kosher salt; meat tenderizers; monosodium glutamate; mustard, regular soy sauce, barbecue, sauce, chili sauce, teriyaki sauce, steak sauce, Worcestershire sauce, and most flavored vinegars; canned gravy and mixes; regular condiments; salted snack foods, olives, picles, relish, horseradish sauce, catsup   Food preparation: Try these seasonings Meats:    Pork Sage, onion Serve with applesauce  Chicken Poultry seasoning, thyme, parsley Serve with cranberry sauce  Lamb Curry powder, rosemary, garlic, thyme Serve with mint sauce or jelly  Veal Marjoram, basil Serve with current jelly, cranberry sauce  Beef Pepper, bay leaf Serve with dry mustard, unsalted chive butter  Fish [place] leaf, dill Serve with unsalted lemon butter, unsalted parsley butter  Vegetables:    Asparagus Lemon juice   Broccoli Lemon juice   Carrots Mustard dressing parsley, mint, nutmeg, glazed with unsalted butter and sugar   Green beans Marjoram, lemon juice, nutmeg,dill seed   Tomatoes Basil, marjoram, onion   Spice /blend for [employer]" 4 tsp ground thyme 1 tsp ground sage 3 tsp ground rosemary 4 tsp ground marjoram   Test your knowledge 1. A product that says "Salt Free" may still contain sodium. True or False 2. Garlic Powder and Hot Pepper Sauce an be used as alternative seasonings.True or False 3. Processed foods have more sodium than fresh foods.  True or False 4. Canned Vegetables have less sodium than froze True or False  WAYS  TO DECREASE YOUR SODIUM INTAKE 1. Avoid the use of added salt in cooking and at the table.  Table salt (and other prepared seasonings which contain salt) is probably one of the greatest sources of sodium in the diet.  Unsalted foods can gain flavor from the sweet, sour, and butter taste sensations of herbs and spices.  Instead of using salt for seasoning, try the following seasonings with the foods listed.  Remember: how you use them to enhance natural food flavors is limited only by your creativity... Allspice-Meat, fish, eggs, fruit, peas, red and yellow vegetables Almond Extract-Fruit baked goods Anise Seed-Sweet breads, fruit, carrots, beets, cottage cheese, cookies (tastes like licorice) Basil-Meat, fish, eggs, vegetables, rice, vegetables salads, soups, sauces Bay Leaf-Meat, fish, stews, poultry Burnet-Salad, vegetables (cucumber-like flavor) Caraway Seed-Bread, cookies, cottage cheese, meat, vegetables, cheese, rice Cardamon-Baked goods, fruit, soups Celery Powder or seed-Salads, salad dressings, sauces, meatloaf, soup, bread.Do not use  celery salt Chervil-Meats, salads, fish, eggs, vegetables, cottage cheese (parsley-like flavor) Chili Power-Meatloaf, chicken cheese, corn, eggplant, egg dishes Chives-Salads cottage cheese, egg dishes, soups, vegetables, sauces Cilantro-Salsa, casseroles Cinnamon-Baked goods, fruit, pork, lamb, chicken, carrots Cloves-Fruit, baked goods, fish, pot roast, green beans, beets, carrots Coriander-Pastry, cookies, meat, salads, cheese (lemon-orange flavor) Cumin-Meatloaf, fish,cheese, eggs, cabbage,fruit pie (caraway flavor) [employer], fruit, eggs, fish, poultry, cottage cheese, vegetables Dill Seed-Meat, cottage cheese, poultry, vegetables, fish, salads, bread Fennel Seed-Bread, cookies,  apples, pork, eggs, fish, beets, cabbage, cheese, Licorice-like flavor Garlic-(buds or powder) Salads, meat, poultry, fish, bread, butter, vegetables, potatoes.Do  not  use garlic salt Ginger-Fruit, vegetables, baked goods, meat, fish, poultry Horseradish Root-Meet, vegetables, butter Lemon Juice or Extract-Vegetables, fruit, tea, baked goods, fish salads Mace-Baked goods fruit, vegetables, fish, poultry (taste like nutmeg) Maple Extract-Syrups Marjoram-Meat, chicken, fish, vegetables, breads, green salads (taste like Sage) Mint-Tea, lamb, sherbet, vegetables, desserts, carrots, cabbage Mustard, Dry or Seed-Cheese, eggs, meats, vegetables, poultry Nutmeg-Baked goods, fruit, chicken, eggs, vegetables, desserts Onion Powder-Meat, fish, poultry, vegetables, cheese, eggs, bread, rice salads (Do not use   Onion salt) Orange Extract-Desserts, baked goods Oregano-Pasta, eggs, cheese, onions, pork, lamb, fish, chicken, vegetables, green salads Paprika-Meat, fish, poultry, eggs, cheese, vegetables Parsley Flakes-Butter, vegetables, meat fish, poultry, eggs, bread, salads (certain forms may   Contain sodium Pepper-Meat fish, poultry, vegetables, eggs Peppermint Extract-Desserts, baked goods Poppy Seed-Eggs, bread, cheese, fruit dressings, baked goods, noodles, vegetables, cottage  [employer], poultry, meat, fish, cauliflower, turnips,eggs bread Saffron-Rice, bread, veal, chicken, fish, eggs Sage-Meat, fish, poultry, onions, eggplant, tomateos, pork, stews Savory-Eggs, salads, poultry, meat, rice, vegetables, soups, pork Tarragon-Meat, poultry, fish, eggs, butter, vegetables (licorice-like flavor)  Thyme-Meat, poultry, fish, eggs, vegetables, (clover-like flavor), sauces, soups Tumeric-Salads, butter, eggs, fish, rice, vegetables (saffron-like flavor) Vanilla Extract-Baked goods, candy Vinegar-Salads, vegetables, meat marinades Walnut Extract-baked goods, candy  2. Choose your Foods Wisely   The following is a list of foods to avoid which are high in sodium:  Meats-Avoid all smoked, canned, salt cured, dried and  kosher meat and fish as well as Anchovies   [employer] meats:Bologna, Liverwurst, Pastrami Canned meat or fish  Marinated herring Caviar    Pepperoni Corned Beef   Pizza Dried chipped beef  Salami Frozen breaded fish or meat Salt pork Frankfurters or hot dogs  Sardines Gefilte fish   Sausage Ham (boiled ham, Proscuitto Smoked butt    spiced ham)   Spam      TV Dinners Vegetables Canned vegetables (Regular) Relish Canned mushrooms  Sauerkraut Olives    Tomato juice Pickles  Bakery and Dessert Products Canned puddings  Cream pies Cheesecake   Decorated cakes Cookies  Beverages/Juices Tomato juice, regular  Gatorade   V-8 vegetable juice, regular  Breads and Cereals Biscuit mixes   Salted potato chips, corn chips, pretzels Bread stuffing mixes  Salted crackers and rolls Pancake and waffle mixes Self-rising flour  Seasonings Accent    Meat sauces Barbecue sauce  Meat tenderizer Catsup    Monosodium glutamate (MSG) Celery salt   Onion salt Chili sauce   Prepared mustard Garlic salt   Salt, seasoned salt, sea salt Gravy mixes   Soy sauce Horseradish   Steak sauce Ketchup   Tartar sauce Lite salt    Teriyaki sauce Marinade mixes   Worcestershire sauce  Others Baking powder   Cocoa and cocoa mixes Baking soda   Commercial casserole mixes Candy-caramels, chocolate  Dehydrated soups    Bars, fudge,nougats  Instant rice and pasta mixes Canned broth or soup  Maraschino cherries Cheese, aged and processed cheese and cheese spreads  Learning Assessment Quiz  Indicated T (for True) or F (for False) for each of the following statements:  1. _____ Fresh fruits and vegetables and unprocessed grains are generally low in sodium 2. _____ Water may contain a considerable amount of sodium, depending on the source 3. _____ You can always tell if a food is high in sodium by  tasting it 4. _____ Certain laxatives my be high in sodium and should be avoided unless  prescribed   by a physician or pharmacist 5. _____ Salt substitutes may be used freely by anyone on a sodium restricted diet 6. _____ Sodium is present in table salt, food additives and as a natural component of   most foods 7. _____ Table salt is approximately 90% sodium 8. _____ Limiting sodium intake may help prevent excess fluid accumulation in the body 9. _____ On a sodium-restricted diet, seasonings such as bouillon soy sauce, and    cooking wine should be used in place of table salt 10. _____ On an ingredient list, a product which lists monosodium glutamate as the first   ingredient is an appropriate food to include on a low sodium diet  Circle the best answer(s) to the following statements (Hint: there may be more than one correct answer)  11. On a low-sodium diet, some acceptable snack items are:    A. Olives  F. Bean dip   K. Grapefruit juice    B. Salted Pretzels G. Commercial Popcorn   L. Canned peaches    C. Carrot Sticks  H. Bouillon   M. Unsalted nuts   D. [place] fries  I. Peanut butter crackers N. Salami   E. Sweet pickles J. Tomato Juice   O. Pizza  12.  Seasonings that may be used freely on a reduced - sodium diet include   A. Lemon wedges F.Monosodium glutamate K. Celery seed    B.Soysauce   G. Pepper   L. Mustard powder   C. Sea salt  H. Cooking wine  M. Onion flakes   D. Vinegar  E. Prepared horseradish N. Salsa   E. Sage   J. Worcestershire sauce  O. Chutney

## 2018-02-28 NOTE — Telephone Encounter (Signed)
Spoke with pt's son [name]. Clarified that the pt should take 60mg  of Torsemide x 3 days. Zaroxolyn 2.5mg  for 2 days  [name] verbalized understanding and voiced appreciation for the call.

## 2018-02-28 NOTE — Telephone Encounter (Signed)
Follow Up ° °Pt returning call for nurse °

## 2018-02-28 NOTE — Telephone Encounter (Signed)
Called pt son to make sure that he is aware that her Torsemide should be 60mg  for 3 days. [id number].

## 2018-03-02 DIAGNOSIS — S5001XA Contusion of right elbow, initial encounter: Secondary | ICD-10-CM | POA: Insufficient documentation

## 2018-03-02 DIAGNOSIS — M25521 Pain in right elbow: Secondary | ICD-10-CM | POA: Insufficient documentation

## 2018-03-03 ENCOUNTER — Encounter (HOSPITAL_COMMUNITY): Payer: Medicare Other

## 2018-03-04 ENCOUNTER — Other Ambulatory Visit: Payer: Self-pay

## 2018-03-04 LAB — BASIC METABOLIC PANEL
BUN / CREAT RATIO: 24 (ref 12–28)
BUN: 36 mg/dL — ABNORMAL HIGH (ref 8–27)
CO2: 29 mmol/L (ref 20–29)
CREATININE: 1.52 mg/dL — AB (ref 0.57–1.00)
Calcium: 9.2 mg/dL (ref 8.7–10.3)
Chloride: 98 mmol/L (ref 96–106)
GFR, EST AFRICAN AMERICAN: 35 mL/min/{1.73_m2} — AB (ref >59)
GFR, EST NON AFRICAN AMERICAN: 31 mL/min/{1.73_m2} — AB (ref >59)
Glucose: 87 mg/dL (ref 65–99)
Potassium: 3.9 mmol/L (ref 3.5–5.2)
Sodium: 140 mmol/L (ref 134–144)

## 2018-03-04 LAB — IRON: Iron: 41 ug/dL (ref 27–139)

## 2018-03-09 ENCOUNTER — Ambulatory Visit (INDEPENDENT_AMBULATORY_CARE_PROVIDER_SITE_OTHER): Payer: Medicare Other | Admitting: Physician Assistant

## 2018-03-09 ENCOUNTER — Encounter: Payer: Self-pay | Admitting: Physician Assistant

## 2018-03-09 VITALS — BP 118/58 | HR 96 | Ht 62.0 in | Wt 150.6 lb

## 2018-03-09 DIAGNOSIS — N184 Chronic kidney disease, stage 4 (severe): Secondary | ICD-10-CM | POA: Diagnosis not present

## 2018-03-09 DIAGNOSIS — I10 Essential (primary) hypertension: Secondary | ICD-10-CM

## 2018-03-09 DIAGNOSIS — R42 Dizziness and giddiness: Secondary | ICD-10-CM | POA: Diagnosis not present

## 2018-03-09 DIAGNOSIS — E039 Hypothyroidism, unspecified: Secondary | ICD-10-CM

## 2018-03-09 DIAGNOSIS — R188 Other ascites: Secondary | ICD-10-CM

## 2018-03-09 DIAGNOSIS — E119 Type 2 diabetes mellitus without complications: Secondary | ICD-10-CM

## 2018-03-09 DIAGNOSIS — I5032 Chronic diastolic (congestive) heart failure: Secondary | ICD-10-CM | POA: Diagnosis not present

## 2018-03-09 MED ORDER — TORSEMIDE 20 MG PO TABS: ORAL_TABLET | ORAL | 3 refills | Status: DC

## 2018-03-09 NOTE — Progress Notes (Signed)
Cardiology Office Note    Date:  03/09/2018   ID:  Madison Riggs, DOB 08-20-1927, MRN [id number]  PCP:  Starlyn Skeans, PA-C  Cardiologist:  Dr. Percival Spanish   Chief Complaint  Patient presents with  . Follow-up    seen for Dr. Percival Spanish, post diuresis    History of Present Illness:  Madison Riggs is a 84 y.o. female with PMH of CKD stage III, HTN, HLD, DM II, aortic stenosis, chronic diastolic heart failure,ascites, hypothyroidism and OSA on CPAP. She was hospitalized in May 2018 for anemia. Hemoglobin in the ED was 7.9 with positive Hemoccult. She required transfusion. EGD performed on 10/18/2016 was positive for reflux esophagitis, however no obvious source of bleeding. She did have some pulmonary edema on chest x-ray as well. Torsemide was decreased at discharge. Echocardiogram obtained on 10/17/2016 showed EF 60-65%, hypokinesis of the mid apical anterior septal myocardium, grade 2 DD, mild MS/AS,moderate TR, PA peak pressure 48 mmHg. Echocardiogram also noted ascites. CT of chest without contrast also noted partiallyvisible abdominal ascites, possible liver cirrhosis and prior gastric bypass. Abdominal CT obtained on 01/05/2017 showed large volume of abdominal ascites, diffuse body wall edema consistent with anasarca, small pleural effusion and atelectasis in the right lung base, aortic atherosclerosis. She most recently underwent paracentesis on 02/01/2017 with removal of 2 Lperitonealfluid. Cytology was negative for malignancy. Although heralbumin wasoccasionallylow, howevermost of thetimes her albumin was normal.   I last saw the patient on 11/02/2017, she continued to have 3+ pitting edema in bilateral lower extremity and abdominal distention.  Her weight is stable.  Her overall volume status was stable as well.  Since the last visit, she has lost about 12 pounds.  Her lower extremity looks a lot better, however remain swollen with 3+ pitting edema.  On physical exam, she  continued to have 3 out of 6 systolic murmur near the right upper sternal border.  She likely will need a repeat echocardiogram next year.  As far as her lower extremity edema, I would like to see if we can diurese her a little bit more.  I will increase her diuretic to 40 mg a.m. and the 20 mg p.m. torsemide.  She will need a basic metabolic panel either next Monday or Tuesday.  Despite the fact that she is not on any potassium supplement, her potassium seems to be quite stable despite the recent diuresis.  I will also check a CBC as well given her previous anemia.  She is on the iron infusion, her last CBC was seen in March 2019.  She is currently living with her son who does a very good job taking care of her.  Otherwise her lung is clear.  She denies any chest pain.   Past Medical History:  Diagnosis Date  . Aortic stenosis    moderate  . CKD (chronic kidney disease) stage 3, GFR 30-59 ml/min (HCC)   . Depression   . DM (diabetes mellitus), type 2 ([place])   . Gout   . HTN (hypertension)   . Hypercholesterolemia   . Hypothyroidism   . Iron deficiency anemia   . Sleep apnea    CPAP    Past Surgical History:  Procedure Laterality Date  . BACK SURGERY  1984  . CATARACT EXTRACTION, BILATERAL  1997  . CHOLECYSTECTOMY  2001  . ESOPHAGOGASTRODUODENOSCOPY N/A 10/18/2016   Procedure: ESOPHAGOGASTRODUODENOSCOPY (EGD);  Surgeon: Teena Irani, MD;  Location: [facility];  Service: Endoscopy;  Laterality: N/A;  . GASTRIC BYPASS  [place]  02/09/2017  . KNEE SURGERY Left 1994  . THORACENTESIS Right    in [place], [place]  . TONSILLECTOMY      Current Medications: Outpatient Medications Prior to Visit  Medication Sig Dispense Refill  . acetaminophen (TYLENOL) 500 MG tablet Take 500 mg by mouth every 6 (six) hours as needed for moderate pain.    Marland Kitchen allopurinol (ZYLOPRIM) 100 MG tablet Take 100 mg by mouth 2 (two) times daily.     Marland Kitchen b complex vitamins tablet Take 1 tablet by mouth  daily.    . calcium carbonate (OS-CAL) 600 MG TABS tablet Take 600 mg by mouth daily.    . cetirizine (ZYRTEC) 10 MG tablet Take 10 mg by mouth at bedtime.     . Cyanocobalamin (VITAMIN B-12) 5000 MCG SUBL Place 5,000 mcg under the tongue daily.    . fluticasone (FLONASE) 50 MCG/ACT nasal spray Place 1 spray into both nostrils daily as needed.    . folic acid (FOLVITE) 1 MG tablet Take 1 mg by mouth every morning.     . Lactobacillus (ACIDOPHILUS) 100 MG CAPS Take 100 mg by mouth every morning.     . Magnesium 400 MG TABS Take 1 tablet by mouth daily.    . metolazone (ZAROXOLYN) 2.5 MG tablet Take 1 tablet (2.5 mg total) by mouth daily as needed. Take 45min before Torsemide 10 tablet 0  . Multiple Vitamins-Minerals (WOMENS MULTIVITAMIN PO) Take 1 tablet by mouth daily.    . pantoprazole (PROTONIX) 40 MG tablet Take 1 tablet (40 mg total) by mouth 2 (two) times daily. 60 tablet 0  . senna-docusate (SENOKOT-S) 8.6-50 MG tablet Take 1 tablet by mouth 2 (two) times daily. (Patient taking differently: Take 2 tablets by mouth at bedtime. ) 60 tablet 0  . simvastatin (ZOCOR) 40 MG tablet Take 20 mg by mouth daily.    Marland Kitchen SYNTHROID 100 MCG tablet Take 100 mcg by mouth daily before breakfast.     . traZODone (DESYREL) 50 MG tablet Take 100 mg by mouth at bedtime.     . triamcinolone cream (KENALOG) 0.1 % Apply 1 application topically 2 (two) times daily.  2  . Turmeric 500 MG TABS Take 500 mg by mouth every morning.     . venlafaxine (EFFEXOR) 75 MG tablet Take 75 mg by mouth daily.     Marland Kitchen torsemide (DEMADEX) 20 MG tablet Take 1 tablet (20 mg total) by mouth 2 (two) times daily. (Patient taking differently: Take 20 mg by mouth once. Alternates between 2-3 pills every other day)     No facility-administered medications prior to visit.      Allergies:   Patient has no known allergies.   Social History   Socioeconomic History  . Marital status: Widowed    Spouse name: Not on file  . Number of  children: Not on file  . Years of education: Not on file  . Highest education level: Not on file  Occupational History  . Not on file  Social Needs  . Financial resource strain: Not on file  . Food insecurity:    Worry: Not on file    Inability: Not on file  . Transportation needs:    Medical: Not on file    Non-medical: Not on file  Tobacco Use  . Smoking status: Never Smoker  . Smokeless tobacco: Never Used  Substance and Sexual Activity  . Alcohol use: No  . Drug use: No  .  Sexual activity: Not on file  Lifestyle  . Physical activity:    Days per week: Not on file    Minutes per session: Not on file  . Stress: Not on file  Relationships  . Social connections:    Talks on phone: Not on file    Gets together: Not on file    Attends religious service: Not on file    Active member of club or organization: Not on file    Attends meetings of clubs or organizations: Not on file    Relationship status: Not on file  Other Topics Concern  . Not on file  Social History Narrative   Widowed   Lives with son and daughter-in-law   [name] here from [place] 03/25/2016   2 sons   Occupation: retired, was a Museum/gallery conservator with [employer]     Family History:  The patient's family history includes Breast cancer in her mother; Colon cancer in her father; Emphysema in her father; Prostate cancer in her father.   ROS:   Please see the history of present illness.    ROS All other systems reviewed and are negative.   PHYSICAL EXAM:   VS:  BP (!) 118/58   Pulse 96   Ht 5\' 2"  (1.575 m)   Wt 150 lb 9.6 oz (68.3 kg)   SpO2 95%   BMI 27.55 kg/m    GEN: Well nourished, well developed, in no acute distress  HEENT: normal  Neck: no JVD, carotid bruits, or masses Cardiac: RRR; no rubs, or gallops     3/6 systolic murmur at RUSB. 3+ pitting edema Respiratory:  clear to auscultation bilaterally, normal work of breathing GI: soft, nontender, nondistended, + BS MS: no deformity or  atrophy  Skin: warm and dry, no rash Neuro:  Alert and Oriented x 3, Strength and sensation are intact Psych: euthymic mood, full affect  Wt Readings from Last 3 Encounters:  03/09/18 150 lb 9.6 oz (68.3 kg)  02/28/18 162 lb 6.4 oz (73.7 kg)  02/14/18 158 lb (71.7 kg)      Studies/Labs Reviewed:   EKG:  EKG is not ordered today.    Recent Labs: 08/19/2017: Hemoglobin 9.9; Platelets 189 03/03/2018: BUN 36; Creatinine, Ser 1.52; Potassium 3.9; Sodium 140   Lipid Panel No results found for: CHOL, TRIG, HDL, CHOLHDL, VLDL, LDLCALC, LDLDIRECT  Additional studies/ records that were reviewed today include:   Echo 10/17/2016 LV EF: 60% -   65% Study Conclusions  - Left ventricle: The cavity size was normal. Systolic function was   normal. The estimated ejection fraction was in the range of 60%   to 65%. Hypokinesis of the mid-apicalanteroseptal myocardium.   Features are consistent with a pseudonormal left ventricular   filling pattern, with concomitant abnormal relaxation and   increased filling pressure (grade 2 diastolic dysfunction).   Doppler parameters are consistent with high ventricular filling   pressure. - Ventricular septum: Septal motion showed &quot;bounce&quot;. The contour   showed diastolic flattening consistent with right ventricular   volume overload. - Aortic valve: There was mild stenosis. There was no   regurgitation. Valve area (VTI): 1.1 cm^2. Valve area (Vmax): 1.1   cm^2. Valve area (Vmean): 0.96 cm^2. - Mitral valve: Severely calcified annulus. Calcification.   Thickening. The findings are consistent with mild stenosis. There   was trivial regurgitation. Valve area by continuity equation   (using LVOT flow): 1.74 cm^2. - Left atrium: The atrium was severely dilated. - Right ventricle:  The cavity size was normal. Wall thickness was   normal. Systolic function was normal. - Atrial septum: No defect or patent foramen ovale was identified   by color flow  Doppler. - Tricuspid valve: There was moderate regurgitation. - Pulmonary arteries: Systolic pressure was mildly to moderately   increased. PA peak pressure: 48 mm Hg (S). - Pericardium, extracardiac: Ascites was noted.    ASSESSMENT:    1. Chronic diastolic HF (heart failure) ([place])   2. Dizziness   3. CKD (chronic kidney disease), stage IV ([place])   4. Essential hypertension   5. Controlled type 2 diabetes mellitus without complication, without long-term current use of insulin (HCC)   6. Other ascites   7. Hypothyroidism, unspecified type      PLAN:  In order of problems listed above:  1. Chronic diastolic heart failure: She has lost 12 pounds after a short course of metolazone and increased to torsemide.  I wish to see if that she can be diuresed further.  I will increase her torsemide to 40 mg a.m. and 20 mg p.m.  She will need a basic metabolic panel next Monday or Tuesday.  I will see her back in 2 weeks for further adjustment  2. CKD stage IV: Renal function stable  3. Hypertension: Blood pressure stable  4. DM2: Managed by primary care provider  5. Ascites: Had paracentesis back in 2018 due to volume overload.  6. Hypothyroidism: Managed by primary care provider.  7. Hyperlipidemia: Continue Zocor 20 mg daily.    Medication Adjustments/Labs and Tests Ordered: Current medicines are reviewed at length with the patient today.  Concerns regarding medicines are outlined above.  Medication changes, Labs and Tests ordered today are listed in the Patient Instructions below. Patient Instructions  Medication Instructions:  Your physician has recommended you make the following change in your medication:   1. INCREASE TORSEMIDE TO 40 MG IN THE AM AND 20 MG IN THE PM   Labwork: BMET and CBC to be done on next Monday, September 30 th or Tuesday, October 1st.  Testing/Procedures: None ordered  Follow-Up: Your physician wants you to follow-up in: 2 weeks with Almyra Deforest,  PA.  Any Other Special Instructions Will Be Listed Below (If Applicable).     If you need a refill on your cardiac medications before your next appointment, please call your pharmacy.      Hilbert Corrigan, [place]  03/09/2018 1:50 PM    [facility] [address], [place], [place]  [address] Phone: [phone]; Fax: [phone]

## 2018-03-09 NOTE — Patient Instructions (Addendum)
Medication Instructions:  Your physician has recommended you make the following change in your medication:   1. INCREASE TORSEMIDE TO 40 MG IN THE AM AND 20 MG IN THE PM   Labwork: BMET and CBC to be done on next Monday, September 30 th or Tuesday, October 1st.  Testing/Procedures: None ordered  Follow-Up: Your physician wants you to follow-up in: 2 weeks with Almyra Deforest, PA.  Any Other Special Instructions Will Be Listed Below (If Applicable).     If you need a refill on your cardiac medications before your next appointment, please call your pharmacy.

## 2018-03-14 LAB — CBC
HEMOGLOBIN: 9.9 g/dL — AB (ref 11.1–15.9)
Hematocrit: 31.2 % — ABNORMAL LOW (ref 34.0–46.6)
MCH: 33.6 pg — ABNORMAL HIGH (ref 26.6–33.0)
MCHC: 31.7 g/dL (ref 31.5–35.7)
MCV: 106 fL — ABNORMAL HIGH (ref 79–97)
Platelets: 225 10*3/uL (ref 150–450)
RBC: 2.95 x10E6/uL — ABNORMAL LOW (ref 3.77–5.28)
RDW: 14.4 % (ref 12.3–15.4)
WBC: 5.1 10*3/uL (ref 3.4–10.8)

## 2018-03-24 ENCOUNTER — Ambulatory Visit: Payer: Medicare Other | Admitting: Physician Assistant

## 2018-03-29 ENCOUNTER — Ambulatory Visit (INDEPENDENT_AMBULATORY_CARE_PROVIDER_SITE_OTHER): Payer: Medicare Other | Admitting: Physician Assistant

## 2018-03-29 ENCOUNTER — Encounter: Payer: Self-pay | Admitting: Physician Assistant

## 2018-03-29 VITALS — BP 114/60 | HR 94 | Ht 62.0 in | Wt 158.0 lb

## 2018-03-29 DIAGNOSIS — I5032 Chronic diastolic (congestive) heart failure: Secondary | ICD-10-CM | POA: Diagnosis not present

## 2018-03-29 DIAGNOSIS — D508 Other iron deficiency anemias: Secondary | ICD-10-CM | POA: Diagnosis not present

## 2018-03-29 DIAGNOSIS — N183 Chronic kidney disease, stage 3 unspecified: Secondary | ICD-10-CM

## 2018-03-29 DIAGNOSIS — Z23 Encounter for immunization: Secondary | ICD-10-CM

## 2018-03-29 DIAGNOSIS — I10 Essential (primary) hypertension: Secondary | ICD-10-CM

## 2018-03-29 MED ORDER — TORSEMIDE 20 MG PO TABS: 40.0000 mg | ORAL_TABLET | Freq: Two times a day (BID) | ORAL | 0 refills | Status: DC

## 2018-03-29 NOTE — Patient Instructions (Addendum)
Medication Instructions:  INCREASE Torsemide to 40mg  Take 1 tablet twice a day  If you need a refill on your cardiac medications before your next appointment, please call your pharmacy.   Lab work: Your physician recommends that you return for lab work in: TODAY-BMET, [place] If you have labs (blood work) drawn today and your tests are completely normal, you will receive your results only by: Marland Kitchen MyChart Message (if you have MyChart) OR . A paper copy in the mail If you have any lab test that is abnormal or we need to change your treatment, we will call you to review the results.  Testing/Procedures: NONE  Follow-Up: At [facility], you and your health needs are our priority.  As part of our continuing mission to provide you with exceptional heart care, we have created designated Provider Care Teams.  These Care Teams include your primary Cardiologist (physician) and Advanced Practice Providers (APPs -  Physician Assistants and Nurse Practitioners) who all work together to provide you with the care you need, when you need it.  Your physician recommends that you schedule a follow-up appointment in: 1-2 MONTHS with DR [facility].  Any Other Special Instructions Will Be Listed Below (If Applicable).

## 2018-03-29 NOTE — Progress Notes (Signed)
Cardiology Office Note    Date:  03/29/2018   ID:  SHUNTIA EXTON, DOB 1928/02/23, MRN [id number]  PCP:  Starlyn Skeans, PA-C  Cardiologist:  Dr. Percival Spanish  Chief Complaint  Patient presents with  . Follow-up    2 weeks. Seen for Dr. Percival Spanish  . Headache    Light headed bending.    History of Present Illness:  Madison Riggs is a 84 y.o. female with PMH of CKD stage III, HTN, HLD, DM II,aortic stenosis, chronic diastolic heart failure,ascites,hypothyroidismand OSA on CPAP. She was hospitalized in May 2018 for anemia. Hemoglobin in the ED was 7.9 with positive Hemoccult. She required transfusion. EGD performed on 10/18/2016 was positive for reflux esophagitis, however no obvious source of bleeding. She did have some pulmonary edema on chest x-ray as well. Torsemide was decreased at discharge. Echocardiogram obtained on 10/17/2016 showed EF 60-65%, hypokinesis of the mid apical anterior septal myocardium, grade 2 DD, mild MS/AS,moderate TR, PA peak pressure 48 mmHg. Echocardiogram also noted ascites. CT of chest without contrast also noted partiallyvisible abdominal ascites, possible liver cirrhosis and prior gastric bypass. Abdominal CT obtained on 01/05/2017 showed large volume of abdominal ascites, diffuse body wall edema consistent with anasarca, small pleural effusion and atelectasis in the right lung base, aortic atherosclerosis. She most recently underwent paracentesis on 02/01/2017 with removal of 2 Lperitonealfluid. Cytology was negative for malignancy. Although heralbumin wasoccasionallylow, howevermost of thetimes her albumin was normal.   Patient was last seen by Dr. Percival Spanish on 02/28/2018, her diuretic was increased.  When I last saw the patient on 9/25, she has lost roughly 12 pounds.  Her weight was 150 pounds at that time.  I increased her torsemide to 40mg  AM and 20mg  PM in hope to diurese her further as she continued to have 3+ pitting edema in bilateral lower  extremity.  I recommend a repeat CBC and basic metabolic panel however only CBC was done.  Patient presents today for cardiology office visit.  Her son admits that she is eating more sodium.  Her weight actually increased by 8 pounds despite the increase diuretic.  Her lung is clear, she denies any orthopnea and PND.  Her son has been taking her to visit the mountains and also [facility] recently as well.  I would recommend increasing her diuretic torsemide to 40 mg twice daily.  I will check a basic metabolic panel.  She will continue on compression stocking.  I am hesitant to diurese her too aggressively given her age.   Past Medical History:  Diagnosis Date  . Aortic stenosis    moderate  . CKD (chronic kidney disease) stage 3, GFR 30-59 ml/min (HCC)   . Depression   . DM (diabetes mellitus), type 2 ([facility])   . Gout   . HTN (hypertension)   . Hypercholesterolemia   . Hypothyroidism   . Iron deficiency anemia   . Sleep apnea    CPAP    Past Surgical History:  Procedure Laterality Date  . BACK SURGERY  1984  . CATARACT EXTRACTION, BILATERAL  1997  . CHOLECYSTECTOMY  2001  . ESOPHAGOGASTRODUODENOSCOPY N/A 10/18/2016   Procedure: ESOPHAGOGASTRODUODENOSCOPY (EGD);  Surgeon: Teena Irani, MD;  Location: [facility];  Service: Endoscopy;  Laterality: N/A;  . GASTRIC BYPASS  1999  . IR PARACENTESIS  02/09/2017  . KNEE SURGERY Left 1994  . THORACENTESIS Right    in [place], [place]  . TONSILLECTOMY      Current Medications: Outpatient Medications Prior to  Visit  Medication Sig Dispense Refill  . acetaminophen (TYLENOL) 500 MG tablet Take 500 mg by mouth every 6 (six) hours as needed for moderate pain.    Marland Kitchen allopurinol (ZYLOPRIM) 100 MG tablet Take 100 mg by mouth 2 (two) times daily.     Marland Kitchen b complex vitamins tablet Take 1 tablet by mouth daily.    . calcium carbonate (OS-CAL) 600 MG TABS tablet Take 600 mg by mouth daily.    . cetirizine (ZYRTEC) 10 MG tablet Take 10 mg by mouth at  bedtime.     . Cyanocobalamin (VITAMIN B-12) 5000 MCG SUBL Place 5,000 mcg under the tongue daily.    . fluticasone (FLONASE) 50 MCG/ACT nasal spray Place 1 spray into both nostrils daily as needed.    . folic acid (FOLVITE) 1 MG tablet Take 1 mg by mouth every morning.     . Lactobacillus (ACIDOPHILUS) 100 MG CAPS Take 100 mg by mouth every morning.     . Magnesium 400 MG TABS Take 1 tablet by mouth daily.    . metolazone (ZAROXOLYN) 2.5 MG tablet Take 1 tablet (2.5 mg total) by mouth daily as needed. Take 44min before Torsemide 10 tablet 0  . Multiple Vitamins-Minerals (WOMENS MULTIVITAMIN PO) Take 1 tablet by mouth daily.    . pantoprazole (PROTONIX) 40 MG tablet Take 1 tablet (40 mg total) by mouth 2 (two) times daily. 60 tablet 0  . senna-docusate (SENOKOT-S) 8.6-50 MG tablet Take 1 tablet by mouth 2 (two) times daily. (Patient taking differently: Take 2 tablets by mouth at bedtime. ) 60 tablet 0  . simvastatin (ZOCOR) 40 MG tablet Take 20 mg by mouth daily.    Marland Kitchen SYNTHROID 100 MCG tablet Take 100 mcg by mouth daily before breakfast.     . traZODone (DESYREL) 50 MG tablet Take 100 mg by mouth at bedtime.     . triamcinolone cream (KENALOG) 0.1 % Apply 1 application topically 2 (two) times daily.  2  . Turmeric 500 MG TABS Take 500 mg by mouth every morning.     . venlafaxine (EFFEXOR) 75 MG tablet Take 75 mg by mouth daily.     Marland Kitchen torsemide (DEMADEX) 20 MG tablet TAKE 40 MG IN THE AM AND 20 MG IN THE PM BY MOUTH DAILY. 270 tablet 3   No facility-administered medications prior to visit.      Allergies:   Patient has no known allergies.   Social History   Socioeconomic History  . Marital status: Widowed    Spouse name: Not on file  . Number of children: Not on file  . Years of education: Not on file  . Highest education level: Not on file  Occupational History  . Not on file  Social Needs  . Financial resource strain: Not on file  . Food insecurity:    Worry: Not on file     Inability: Not on file  . Transportation needs:    Medical: Not on file    Non-medical: Not on file  Tobacco Use  . Smoking status: Never Smoker  . Smokeless tobacco: Never Used  Substance and Sexual Activity  . Alcohol use: No  . Drug use: No  . Sexual activity: Not on file  Lifestyle  . Physical activity:    Days per week: Not on file    Minutes per session: Not on file  . Stress: Not on file  Relationships  . Social connections:    Talks on phone: Not on  file    Gets together: Not on file    Attends religious service: Not on file    Active member of club or organization: Not on file    Attends meetings of clubs or organizations: Not on file    Relationship status: Not on file  Other Topics Concern  . Not on file  Social History Narrative   Widowed   Lives with son and daughter-in-law   [name] here from [place] 03/25/2016   2 sons   Occupation: retired, was a Museum/gallery conservator with [employer]     Family History:  The patient's family history includes Breast cancer in her mother; Colon cancer in her father; Emphysema in her father; Prostate cancer in her father.   ROS:   Please see the history of present illness.    ROS All other systems reviewed and are negative.   PHYSICAL EXAM:   VS:  BP 114/60 (BP Location: Left Arm, Patient Position: Sitting, Cuff Size: Normal)   Pulse 94   Ht 5\' 2"  (1.575 m)   Wt 158 lb (71.7 kg)   BMI 28.90 kg/m    GEN: Well nourished, well developed, in no acute distress  HEENT: normal  Neck: no JVD, carotid bruits, or masses Cardiac: RRR; no murmurs, rubs, or gallops,no edema  Respiratory:  clear to auscultation bilaterally, normal work of breathing GI: soft, nontender, nondistended, + BS MS: no deformity or atrophy  Skin: warm and dry, no rash Neuro:  Alert and Oriented x 3, Strength and sensation are intact Psych: euthymic mood, full affect  Wt Readings from Last 3 Encounters:  03/29/18 158 lb (71.7 kg)  03/09/18 150 lb 9.6  oz (68.3 kg)  02/28/18 162 lb 6.4 oz (73.7 kg)      Studies/Labs Reviewed:   EKG:  EKG is not ordered today.    Recent Labs: 03/03/2018: BUN 36; Creatinine, Ser 1.52; Potassium 3.9; Sodium 140 03/14/2018: Hemoglobin 9.9; Platelets 225   Lipid Panel No results found for: CHOL, TRIG, HDL, CHOLHDL, VLDL, LDLCALC, LDLDIRECT  Additional studies/ records that were reviewed today include:   Echo 10/17/2016 LV EF: 60% - 65% Study Conclusions  - Left ventricle: The cavity size was normal. Systolic function was normal. The estimated ejection fraction was in the range of 60% to 65%. Hypokinesis of the mid-apicalanteroseptal myocardium. Features are consistent with a pseudonormal left ventricular filling pattern, with concomitant abnormal relaxation and increased filling pressure (grade 2 diastolic dysfunction). Doppler parameters are consistent with high ventricular filling pressure. - Ventricular septum: Septal motion showed &quot;bounce&quot;. The contour showed diastolic flattening consistent with right ventricular volume overload. - Aortic valve: There was mild stenosis. There was no regurgitation. Valve area (VTI): 1.1 cm^2. Valve area (Vmax): 1.1 cm^2. Valve area (Vmean): 0.96 cm^2. - Mitral valve: Severely calcified annulus. Calcification. Thickening. The findings are consistent with mild stenosis. There was trivial regurgitation. Valve area by continuity equation (using LVOT flow): 1.74 cm^2. - Left atrium: The atrium was severely dilated. - Right ventricle: The cavity size was normal. Wall thickness was normal. Systolic function was normal. - Atrial septum: No defect or patent foramen ovale was identified by color flow Doppler. - Tricuspid valve: There was moderate regurgitation. - Pulmonary arteries: Systolic pressure was mildly to moderately increased. PA peak pressure: 48 mm Hg (S). - Pericardium, extracardiac: Ascites was  noted.   ASSESSMENT:    1. Chronic diastolic HF (heart failure) (HCC)   2. Other iron deficiency anemia   3. Need for immunization  against influenza   4. CKD (chronic kidney disease), stage III ([facility])   5. Essential hypertension      PLAN:  In order of problems listed above:  1. Chronic diastolic heart failure: Her weight increased by 8 pounds despite increased dose of diuretic.  Her weight at home has been ranging in the 150 to 153 pounds.  I think her dry weight is likely around 145 to 150 pounds.  We will continue to increase torsemide to 40 mg twice daily.  We will hold off on initiating weekly dose of metolazone at this time.  She was supposed to have a basic metabolic panel after the last visit, this was not done.  I will obtain a basic metabolic panel today  2. CKD stage III: Obtain basic metabolic panel  3. Hypertension: Blood pressure stable on current therapy  4. DM2: Managed by primary care providers  5. Anemia: Stable on the recent lab work, obtain anemia panel  6. Ascites: She underwent a paracentesis back in 2018 due to volume overload.  Her abdomen is quite soft on physical exam  7. Hypothyroidism: Managed by primary care provider  8. Hyperlipidemia: On Zocor 20 mg daily. Annual lipid panel followed by PCP  She was given flu shot in the office today at patient's request.    Medication Adjustments/Labs and Tests Ordered: Current medicines are reviewed at length with the patient today.  Concerns regarding medicines are outlined above.  Medication changes, Labs and Tests ordered today are listed in the Patient Instructions below. Patient Instructions  Medication Instructions:  INCREASE Torsemide to 40mg  Take 1 tablet twice a day  If you need a refill on your cardiac medications before your next appointment, please call your pharmacy.   Lab work: Your physician recommends that you return for lab work in: TODAY-BMET, [place] If you have labs (blood work)  drawn today and your tests are completely normal, you will receive your results only by: Marland Kitchen MyChart Message (if you have MyChart) OR . A paper copy in the mail If you have any lab test that is abnormal or we need to change your treatment, we will call you to review the results.  Testing/Procedures: NONE  Follow-Up: At [facility], you and your health needs are our priority.  As part of our continuing mission to provide you with exceptional heart care, we have created designated Provider Care Teams.  These Care Teams include your primary Cardiologist (physician) and Advanced Practice Providers (APPs -  Physician Assistants and Nurse Practitioners) who all work together to provide you with the care you need, when you need it.  Your physician recommends that you schedule a follow-up appointment in: 1-2 MONTHS with DR [facility].  Any Other Special Instructions Will Be Listed Below (If Applicable).      Hilbert Corrigan, [place]  03/29/2018 4:58 PM    [facility] [address], [place], [place]  [address] Phone: [phone]; Fax: [phone]

## 2018-03-30 LAB — BASIC METABOLIC PANEL
BUN/Creatinine Ratio: 31 — ABNORMAL HIGH (ref 12–28)
BUN: 43 mg/dL — AB (ref 8–27)
CALCIUM: 8.8 mg/dL (ref 8.7–10.3)
CO2: 25 mmol/L (ref 20–29)
Chloride: 104 mmol/L (ref 96–106)
Creatinine, Ser: 1.4 mg/dL — ABNORMAL HIGH (ref 0.57–1.00)
GFR, EST AFRICAN AMERICAN: 39 mL/min/{1.73_m2} — AB (ref >59)
GFR, EST NON AFRICAN AMERICAN: 34 mL/min/{1.73_m2} — AB (ref >59)
Glucose: 118 mg/dL — ABNORMAL HIGH (ref 65–99)
Potassium: 3.6 mmol/L (ref 3.5–5.2)
Sodium: 142 mmol/L (ref 134–144)

## 2018-03-30 LAB — IRON AND TIBC
IRON SATURATION: 16 % (ref 15–55)
IRON: 37 ug/dL (ref 27–139)
TIBC: 236 ug/dL — AB (ref 250–450)
UIBC: 199 ug/dL (ref 118–369)

## 2018-03-30 LAB — FOLATE

## 2018-03-30 LAB — VITAMIN B12: Vitamin B-12: >2000 pg/mL — ABNORMAL HIGH (ref 232–1245)

## 2018-03-30 LAB — FERRITIN: Ferritin: 305 ng/mL — ABNORMAL HIGH (ref 15–150)

## 2018-03-31 NOTE — Progress Notes (Signed)
Forward anemia panel to PCP, renal function stable, potassium near lower border of normal, recommend add potassium chloride 87meq BID, repeat BMET lab in 2 weeks

## 2018-04-01 ENCOUNTER — Encounter: Payer: Self-pay | Admitting: Podiatry

## 2018-04-01 ENCOUNTER — Ambulatory Visit (INDEPENDENT_AMBULATORY_CARE_PROVIDER_SITE_OTHER): Payer: Medicare Other | Admitting: Podiatry

## 2018-04-01 VITALS — BP 106/67 | HR 87

## 2018-04-01 DIAGNOSIS — S91209A Unspecified open wound of unspecified toe(s) with damage to nail, initial encounter: Secondary | ICD-10-CM

## 2018-04-01 DIAGNOSIS — S91201A Unspecified open wound of right great toe with damage to nail, initial encounter: Secondary | ICD-10-CM

## 2018-04-01 NOTE — Patient Instructions (Addendum)
Soak Instructions    THE DAY AFTER THE PROCEDURE  Place 1/4 cup of epsom salts in 2 quarts of warm tap water.  Submerge your foot or feet with outer bandage intact for the initial soak; this will allow the bandage to become moist and wet for easy lift off.  Once you remove your bandage, continue to soak in the solution for 20 minutes.  This soak should be done twice a day.  Next, remove your foot or feet from solution, blot dry the affected area and cover.  You may use a band aid large enough to cover the area or use gauze and tape.  Apply other medications to the area as directed by the doctor such as polysporin neosporin.  IF YOUR SKIN BECOMES IRRITATED WHILE USING THESE INSTRUCTIONS, IT IS OKAY TO SWITCH TO  WHITE VINEGAR AND WATER. Or you may use antibacterial soap and water to keep the toe clean  Monitor for any signs/symptoms of infection. Call the office immediately if any occur or go directly to the emergency room. Call with any questions/concerns.

## 2018-04-03 NOTE — Progress Notes (Signed)
Subjective: Madison Riggs is an 84 y.o. hard of hearing female presents today accompanied by her daughter in law today. Daughter in law states Madison Riggs went to the salon to get her hair done today. She stated after the appointment, she received a call from the [facility] her that there was a trail of blood on the floor of the salon going out the door. When her daughter in law checked Madison Riggs feet, she noticed the compression stocking of her right foot was soaked in blood. The blood appears to be coming from her right great toe. It is suspected she may have hit her foot on her rollator which she does from time to time.  Medical History    Date Unknown Aortic stenosis   Date Unknown CKD (chronic kidney disease) stage 3, GFR 30-59 ml/min (HCC)  Date Unknown Depression  Date Unknown DM (diabetes mellitus), type 2 ([facility])  Date Unknown Gout  Date Unknown HTN (hypertension)  Date Unknown Hypercholesterolemia  Date Unknown Hypothyroidism  Date Unknown Iron deficiency anemia  Date Unknown Sleep apnea    Problem List   Cardiovascular and Mediastinum  Hypertension   Chronic diastolic HF (heart failure) ([facility])   Nonrheumatic aortic valve stenosis   Endocrine  Type II diabetes mellitus with manifestations (HCC)   Genitourinary  Acute renal failure (ARF) (HCC)   CKD (chronic kidney disease), stage IV (HCC)   Other  Dyspnea and respiratory abnormalities   Anemia   Abnormal EKG   Anasarca   Contusion of right elbow   Pain in joint of right elbow    Surgical History   02/09/2017 Ir paracentesis  10/18/2016 Esophagogastroduodenoscopy (N/A)   2001 Cholecystectomy  1999 Gastric bypass  1997 Cataract extraction, bilateral  1994 Knee surgery (Left)  1984 Back surgery  Date Unknown Thoracentesis (Right)   Date Unknown Tonsillectomy   Medications    acetaminophen (TYLENOL) 500 MG tablet    allopurinol (ZYLOPRIM) 100 MG tablet    b complex vitamins tablet    calcium carbonate  (OS-CAL) 600 MG TABS tablet    cetirizine (ZYRTEC) 10 MG tablet    Cyanocobalamin (VITAMIN B-12) 5000 MCG SUBL    fluticasone (FLONASE) 50 MCG/ACT nasal spray    folic acid (FOLVITE) 1 MG tablet    Lactobacillus (ACIDOPHILUS) 100 MG CAPS    Magnesium 400 MG TABS    metolazone (ZAROXOLYN) 2.5 MG tablet    Multiple Vitamins-Minerals (WOMENS MULTIVITAMIN PO)    pantoprazole (PROTONIX) 40 MG tablet    senna-docusate (SENOKOT-S) 8.6-50 MG tablet    simvastatin (ZOCOR) 40 MG tablet    SYNTHROID 100 MCG tablet    torsemide (DEMADEX) 20 MG tablet    traZODone (DESYREL) 50 MG tablet    triamcinolone cream (KENALOG) 0.1 %    Turmeric 500 MG TABS    venlafaxine (EFFEXOR) 75 MG tablet    Allergies      No Known Allergies   Tobacco History   Smoking Status  Never Smoker  Smokeless Tobacco Status  Never Used   Family History   Mother (Deceased) Breast cancer         Father (Deceased) Colon cancer    Prostate cancer    Emphysema         Sister (Deceased)         Sister (Deceased)   REVIEW OF SYSTEMS:  [X]  denotes positive finding Cardiac   Comments:  Chest pain or chest pressure:      Shortness of breath  upon exertion:     Short of breath when lying flat:      Irregular heart rhythm:             Vascular      Pain in calf, thigh, or hip brought on by ambulation:      Pain in feet at night that wakes you up from your sleep:       Blood clot in your veins:  X    Leg swelling:             Pulmonary      Oxygen at home:      Productive cough:       Wheezing:              Neurologic      Sudden weakness in arms or legs:       Sudden numbness in arms or legs:       Sudden onset of difficulty speaking or slurred speech:      Temporary loss of vision in one eye:       Problems with dizziness:              Gastrointestinal      Blood in stool:       Vomited blood:       Change in appetite:      Genitourinary      Burning when urinating:       Blood in urine:              Psychiatric      Major depression:       Anxiety:      Hematologic      Bleeding problems:      Problems with blood clotting too easily:             Skin      Rashes or ulcers:             Constitutional      Fever or chills:         Objective: General: 84 y.o. HOH WF, elderly, WD, WN in NAD. Pleasant and cooperative. Vitals:   04/01/18 1254  BP: 106/67  Pulse: 87   Vascular Examination: Capillary refill time <3 seconds x 10 digits Dorsalis pedis and posterior tibial pulses present b/l No digital hair x 10 digits Skin temperature warm to cool b/l Edema +1 BLE  Dermatological Examination: Skin with venous stasis changes b/l LE Toenails 1-5 b/l polished, thick, elongated, discolored, incurvated. Bilateral great toes noted to have pincer nail deformity.  Right great toenail noted to be partially avulsed from distal edge. There is fresh heme and active steady, light bleeding. +Tenderness to palpation.  Musculoskeletal: Muscle strength 5/5 to all LE muscle groups  Neurological: Sensation intact with 10 gram monofilament. Vibratory sensation intact.  Assessment: 1. Traumatic nail avulsion right great toe  Plan: 1. Discussed diagnosis of with treatment options of traumatic nail avulsion of right great toe. 2. Patient/POA agreed to have temporary total nail avulsion of the right great toe 3. Consent form signed and in chart 4. Prepped great toe with betadine 5. Local injection of  3 cc's 50/50 mix of 1% Lidocaine plain and 0.5% marcaine plain administered via hallux block. Offending nail plate freed utilizing elevator. Offending nail plate removed with hemostat. Border cleansed with alcohol. Bleeding controlled with Lumicaine Hemostatic solution and light compressive dressing. Digital perfusion checked after dressing applied and noted to be adequate. Daughter in Sports coach  educated on applying dressing and checking circulation to digit.  6. Discused post-procedure instructions  of epsom salt warm water soaks and dispensed written handout. 7. Patient/POA related understanding of post-procedure instructions 8. Follow up 1 week. Call office if there is increased redness, swelling, drainage, odor or pain. Daughter in law related understanding of diagnosis, procedure and post-procedure instructions.

## 2018-04-04 ENCOUNTER — Other Ambulatory Visit: Payer: Self-pay

## 2018-04-04 DIAGNOSIS — I5032 Chronic diastolic (congestive) heart failure: Secondary | ICD-10-CM

## 2018-04-04 DIAGNOSIS — D508 Other iron deficiency anemias: Secondary | ICD-10-CM

## 2018-04-04 MED ORDER — POTASSIUM CHLORIDE CRYS ER 20 MEQ PO TBCR: 20.0000 meq | EXTENDED_RELEASE_TABLET | Freq: Two times a day (BID) | ORAL | 3 refills | Status: DC

## 2018-04-08 ENCOUNTER — Ambulatory Visit (INDEPENDENT_AMBULATORY_CARE_PROVIDER_SITE_OTHER): Payer: Medicare Other | Admitting: Podiatry

## 2018-04-08 ENCOUNTER — Encounter: Payer: Self-pay | Admitting: Podiatry

## 2018-04-08 DIAGNOSIS — S91209D Unspecified open wound of unspecified toe(s) with damage to nail, subsequent encounter: Secondary | ICD-10-CM

## 2018-04-19 LAB — BASIC METABOLIC PANEL
BUN/Creatinine Ratio: 38 — ABNORMAL HIGH (ref 12–28)
BUN: 54 mg/dL — AB (ref 8–27)
CALCIUM: 9.1 mg/dL (ref 8.7–10.3)
CHLORIDE: 103 mmol/L (ref 96–106)
CO2: 26 mmol/L (ref 20–29)
Creatinine, Ser: 1.44 mg/dL — ABNORMAL HIGH (ref 0.57–1.00)
GFR calc non Af Amer: 33 mL/min/{1.73_m2} — ABNORMAL LOW (ref >59)
GFR, EST AFRICAN AMERICAN: 38 mL/min/{1.73_m2} — AB (ref >59)
Glucose: 122 mg/dL — ABNORMAL HIGH (ref 65–99)
Potassium: 5.5 mmol/L — ABNORMAL HIGH (ref 3.5–5.2)
Sodium: 143 mmol/L (ref 134–144)

## 2018-04-20 NOTE — Progress Notes (Signed)
Renal function stable, potassium high, reduce potassium to 47meq daily instead

## 2018-04-22 ENCOUNTER — Other Ambulatory Visit: Payer: Self-pay | Admitting: Physician Assistant

## 2018-04-22 NOTE — Telephone Encounter (Signed)
Rx request sent to pharmacy.  

## 2018-04-30 NOTE — Progress Notes (Signed)
Subjective: Patient presents today status post temporary total nail avulsion of the right great toe. Her initial injury was a traumatic partial  nail avulsion.  She is accompanied by her son on today.  Objective: Procedure site of right great toe is noted to be completely epithelialized.  There is no erythema, no edema, no drainage, no odor. The area has healed nicely.  Assessment: Status post temporary total nail avulsion of the right great toe which has healed nicely.  Plan: 1.  The patient was advised to discontinue soaking 2.  She was instructed to leave the digit open to air when she is resting at home.  When going out she may put a Band-Aid to protect the area. Son related understanding. 3. Follow-up 3 months 4. POA, son,  is to call the office should there be any question or concern in the interim

## 2018-05-04 NOTE — Progress Notes (Signed)
Cardiology Office Note   Date:  05/05/2018   ID:  Madison Riggs, DOB [birth year], MRN [id number]  PCP:  Rolene Course, PA-C  Cardiologist:   Minus Breeding, MD  Referring:  Rolene Course, PA-[facility]  Chief Complaint  Patient presents with  . Leg Swelling      History of Present Illness: Madison Riggs is a 84 y.o. female who presents for evaluation of diastolic heart failure.  She presents for follow-up. She has been seen a couple of times since I last saw her and she has had diuresis.  She is doing much better.  Her leg swelling is reduced.  Her abdominal distention is unchanged.  Her weight is down from previous.  She thinks she is breathing okay.  Her biggest issue has been with gait unsteadiness and the potential fall.   Past Medical History:  Diagnosis Date  . Aortic stenosis    moderate  . CKD (chronic kidney disease) stage 3, GFR 30-59 ml/min (HCC)   . Depression   . DM (diabetes mellitus), type 2 ([facility])   . Gout   . HTN (hypertension)   . Hypercholesterolemia   . Hypothyroidism   . Iron deficiency anemia   . Sleep apnea    CPAP    Past Surgical History:  Procedure Laterality Date  . BACK SURGERY  1984  . CATARACT EXTRACTION, BILATERAL  1997  . CHOLECYSTECTOMY  2001  . ESOPHAGOGASTRODUODENOSCOPY N/A 10/18/2016   Procedure: ESOPHAGOGASTRODUODENOSCOPY (EGD);  Surgeon: Teena Irani, MD;  Location: [facility];  Service: Endoscopy;  Laterality: N/A;  . GASTRIC BYPASS  1999  . IR PARACENTESIS  02/09/2017  . KNEE SURGERY Left 1994  . THORACENTESIS Right    in [place], [place]  . TONSILLECTOMY       Current Outpatient Medications  Medication Sig Dispense Refill  . acetaminophen (TYLENOL) 500 MG tablet Take 500 mg by mouth every 6 (six) hours as needed for moderate pain.    Marland Kitchen allopurinol (ZYLOPRIM) 100 MG tablet Take 100 mg by mouth 2 (two) times daily.     Marland Kitchen b complex vitamins tablet Take 1 tablet by mouth daily.    . calcium carbonate (OS-CAL) 600 MG TABS  tablet Take 600 mg by mouth daily.    . cetirizine (ZYRTEC) 10 MG tablet Take 10 mg by mouth at bedtime.     . Cyanocobalamin (VITAMIN B-12) 5000 MCG SUBL Place 5,000 mcg under the tongue daily.    . fluticasone (FLONASE) 50 MCG/ACT nasal spray Place 1 spray into both nostrils daily as needed.    . folic acid (FOLVITE) 1 MG tablet Take 1 mg by mouth every morning.     . Lactobacillus (ACIDOPHILUS) 100 MG CAPS Take 100 mg by mouth every morning.     . Magnesium 400 MG TABS Take 1 tablet by mouth daily.    . Multiple Vitamins-Minerals (WOMENS MULTIVITAMIN PO) Take 1 tablet by mouth daily.    . pantoprazole (PROTONIX) 40 MG tablet Take 1 tablet (40 mg total) by mouth 2 (two) times daily. 60 tablet 0  . potassium chloride SA (KLOR-CON M20) 20 MEQ tablet Take 1 tablet (20 mEq total) by mouth 2 (two) times daily. 30 tablet 3  . senna-docusate (SENOKOT-S) 8.6-50 MG tablet Take 1 tablet by mouth 2 (two) times daily. (Patient taking differently: Take 2 tablets by mouth at bedtime. ) 60 tablet 0  . simvastatin (ZOCOR) 40 MG tablet Take 20 mg by mouth daily.    Marland Kitchen  SYNTHROID 100 MCG tablet Take 100 mcg by mouth daily before breakfast.     . torsemide (DEMADEX) 20 MG tablet TAKE 2 TABLETS (40 MG TOTAL) BY MOUTH 2 (TWO) TIMES DAILY. 120 tablet 0  . traZODone (DESYREL) 50 MG tablet Take 100 mg by mouth at bedtime.     . triamcinolone cream (KENALOG) 0.1 % Apply 1 application topically 2 (two) times daily.  2  . Turmeric 500 MG TABS Take 500 mg by mouth every morning.     . venlafaxine (EFFEXOR) 75 MG tablet Take 75 mg by mouth daily.     . metolazone (ZAROXOLYN) 2.5 MG tablet Take 1 tablet (2.5 mg total) by mouth daily as needed. Take 59min before Torsemide 10 tablet 0   No current facility-administered medications for this visit.     Allergies:   Patient has no known allergies.    ROS:  Please see the history of present illness.   Otherwise, review of systems are positive for weakness.   All other systems  are reviewed and negative.    PHYSICAL EXAM: VS:  BP (!) 114/58   Pulse 84   Ht 5\' 2"  (1.575 m)   Wt 142 lb (64.4 kg)   SpO2 100%   BMI 25.97 kg/m  , BMI Body mass index is 25.97 kg/m.  NECK: Positive jugular venous distention at 90 degrees to the jaw, waveform consistent with a CV wave, carotid upstroke brisk and symmetric, no bruits, no thyromegaly LYMPHATICS:  No cervical adenopathy LUNGS:  Clear to auscultation bilaterally BACK:  No CVA tenderness CHEST:  Unremarkable HEART:  S1 and S2 within normal limits, no S3, no S4, no clicks, no rubs, 3 out of 6 systolic murmur heard at the third left intercostal space and holosystolic, no diastolic murmurs ABD:  Positive bowel sounds normal in frequency in pitch, no bruits, no rebound, no guarding, unable to assess midline mass or bruit with the patient seated. EXT:  2 plus pulses throughout, moderately severe leg edema, no cyanosis no clubbing SKIN:  No rashes no nodules NEURO:  Cranial nerves II through XII grossly intact, motor grossly intact throughout PSYCH:  Cognitively intact, oriented to person place and time   EKG:  EKG is not ordered today.    Recent Labs: 03/14/2018: Hemoglobin 9.9; Platelets 225 04/19/2018: BUN 54; Creatinine, Ser 1.44; Potassium 5.5; Sodium 143    Lipid Panel No results found for: CHOL, TRIG, HDL, CHOLHDL, VLDL, LDLCALC, LDLDIRECT    Wt Readings from Last 3 Encounters:  05/05/18 142 lb (64.4 kg)  03/29/18 158 lb (71.7 kg)  03/09/18 150 lb 9.6 oz (68.3 kg)      Other studies Reviewed: Additional studies/ records that were reviewed today include: Labs Review of the above records demonstrates:     ASSESSMENT AND PLAN:   AORTIC STENOSIS:     This was mild and will be followed clinically.   No change in therapy.   TR: She has moderate TR and elevated pulmonary pressures. We are trying to treat this conservatively.  No change in therapy.   CHRONIC DIASTOLIC HF:   She has essentially anasarca.   She seems to be relatively compensated for her TR.  Continue current dose of diuretic.  She is following salt restriction.  No change in therapy.  CKD II - III:    Creatinine was okay.  However, will be addressing the blood work as below.  HYPERKALEMIA: Her blood type II weeks ago demonstrated a potassium of 5.5.  She was supposed to only be taking her potassium once daily but she has been taking it twice daily per old instructions.  It seems to have been some miscommunication and some to check a basic metabolic profile again today.  She is not to take any potassium and likely will not need supplementation.  SLEEP APNEA:   She wears CPAP.  Current medicines are reviewed at length with the patient today.  The patient does not have concerns regarding medicines.  The following changes have been made: None  Labs/ tests ordered today include:  BMET  Orders Placed This Encounter  Procedures  . Basic Metabolic Panel (BMET)     Disposition:   FU with Hao in 2 months.   Signed, Minus Breeding, MD  05/05/2018 4:15 PM    [facility]

## 2018-05-05 ENCOUNTER — Ambulatory Visit (INDEPENDENT_AMBULATORY_CARE_PROVIDER_SITE_OTHER): Payer: Medicare Other | Admitting: Cardiology

## 2018-05-05 ENCOUNTER — Encounter: Payer: Self-pay | Admitting: Cardiology

## 2018-05-05 VITALS — BP 114/58 | HR 84 | Ht 62.0 in | Wt 142.0 lb

## 2018-05-05 DIAGNOSIS — I5032 Chronic diastolic (congestive) heart failure: Secondary | ICD-10-CM

## 2018-05-05 DIAGNOSIS — R601 Generalized edema: Secondary | ICD-10-CM

## 2018-05-05 DIAGNOSIS — Z79899 Other long term (current) drug therapy: Secondary | ICD-10-CM | POA: Diagnosis not present

## 2018-05-05 NOTE — Patient Instructions (Addendum)
Medication Instructions:  Continue current medications  If you need a refill on your cardiac medications before your next appointment, please call your pharmacy.  Labwork: BMP today  If you have labs (blood work) drawn today and your tests are completely normal, you will receive your results only by: Marland Kitchen MyChart Message (if you have MyChart) OR . A paper copy in the mail If you have any lab test that is abnormal or we need to change your treatment, we will call you to review the results.  Testing/Procedures: None Ordered  Follow-Up: You will need a follow up appointment in 3 Months with Almyra Deforest.    At [facility], you and your health needs are our priority.  As part of our continuing mission to provide you with exceptional heart care, we have created designated Provider Care Teams.  These Care Teams include your primary Cardiologist (physician) and Advanced Practice Providers (APPs -  Physician Assistants and Nurse Practitioners) who all work together to provide you with the care you need, when you need it.  Thank you for choosing [facility] at [facility]!!

## 2018-05-06 LAB — BASIC METABOLIC PANEL
BUN/Creatinine Ratio: 30 — ABNORMAL HIGH (ref 12–28)
BUN: 48 mg/dL — AB (ref 8–27)
CALCIUM: 9.2 mg/dL (ref 8.7–10.3)
CHLORIDE: 101 mmol/L (ref 96–106)
CO2: 25 mmol/L (ref 20–29)
Creatinine, Ser: 1.59 mg/dL — ABNORMAL HIGH (ref 0.57–1.00)
GFR calc non Af Amer: 29 mL/min/{1.73_m2} — ABNORMAL LOW (ref >59)
GFR, EST AFRICAN AMERICAN: 33 mL/min/{1.73_m2} — AB (ref >59)
Glucose: 99 mg/dL (ref 65–99)
Potassium: 4 mmol/L (ref 3.5–5.2)
Sodium: 142 mmol/L (ref 134–144)

## 2018-05-09 ENCOUNTER — Telehealth: Payer: Self-pay

## 2018-05-09 MED ORDER — POTASSIUM CHLORIDE CRYS ER 20 MEQ PO TBCR: 20.0000 meq | EXTENDED_RELEASE_TABLET | Freq: Every day | ORAL | Status: DC

## 2018-05-09 NOTE — Telephone Encounter (Signed)
Pt daughter in law Ivin Booty aware of the pt lab results and Dr.Hochreins recommendation.  Notes recorded by Minus Breeding, MD on 05/08/2018 at 1:30 PM EST I would suggest that she take 20 meq of potassium daily. Creat is elevated but OK. Call Ms. Sappington with the results and send results to Rolene Course, PA-C  Pt med list updated. Ivin Booty verbalized understanding.

## 2018-05-15 ENCOUNTER — Other Ambulatory Visit: Payer: Self-pay | Admitting: Cardiology

## 2018-06-02 ENCOUNTER — Other Ambulatory Visit: Payer: Self-pay | Admitting: Physician Assistant

## 2018-06-09 ENCOUNTER — Emergency Department (HOSPITAL_COMMUNITY): Payer: Medicare Other

## 2018-06-09 ENCOUNTER — Inpatient Hospital Stay (HOSPITAL_COMMUNITY)
Admission: EM | Admit: 2018-06-09 | Discharge: 2018-06-20 | DRG: 308 | Disposition: A | Discharge status: Skilled Nursing Facility | Payer: Medicare Other | Attending: Internal Medicine | Admitting: Internal Medicine

## 2018-06-09 ENCOUNTER — Encounter (HOSPITAL_COMMUNITY): Payer: Self-pay | Admitting: Emergency Medicine

## 2018-06-09 DIAGNOSIS — E785 Hyperlipidemia, unspecified: Secondary | ICD-10-CM | POA: Diagnosis present

## 2018-06-09 DIAGNOSIS — W19XXXA Unspecified fall, initial encounter: Secondary | ICD-10-CM | POA: Diagnosis present

## 2018-06-09 DIAGNOSIS — I482 Chronic atrial fibrillation, unspecified: Secondary | ICD-10-CM | POA: Diagnosis present

## 2018-06-09 DIAGNOSIS — I959 Hypotension, unspecified: Secondary | ICD-10-CM

## 2018-06-09 DIAGNOSIS — Z79899 Other long term (current) drug therapy: Secondary | ICD-10-CM

## 2018-06-09 DIAGNOSIS — I5032 Chronic diastolic (congestive) heart failure: Secondary | ICD-10-CM | POA: Diagnosis present

## 2018-06-09 DIAGNOSIS — E1122 Type 2 diabetes mellitus with diabetic chronic kidney disease: Secondary | ICD-10-CM | POA: Diagnosis present

## 2018-06-09 DIAGNOSIS — Z7952 Long term (current) use of systemic steroids: Secondary | ICD-10-CM

## 2018-06-09 DIAGNOSIS — I10 Essential (primary) hypertension: Secondary | ICD-10-CM | POA: Diagnosis present

## 2018-06-09 DIAGNOSIS — D631 Anemia in chronic kidney disease: Secondary | ICD-10-CM | POA: Diagnosis present

## 2018-06-09 DIAGNOSIS — K921 Melena: Secondary | ICD-10-CM | POA: Diagnosis present

## 2018-06-09 DIAGNOSIS — E7800 Pure hypercholesterolemia, unspecified: Secondary | ICD-10-CM | POA: Diagnosis present

## 2018-06-09 DIAGNOSIS — R195 Other fecal abnormalities: Secondary | ICD-10-CM | POA: Diagnosis present

## 2018-06-09 DIAGNOSIS — K210 Gastro-esophageal reflux disease with esophagitis: Secondary | ICD-10-CM | POA: Diagnosis present

## 2018-06-09 DIAGNOSIS — K219 Gastro-esophageal reflux disease without esophagitis: Secondary | ICD-10-CM | POA: Diagnosis present

## 2018-06-09 DIAGNOSIS — Z79890 Hormone replacement therapy (postmenopausal): Secondary | ICD-10-CM

## 2018-06-09 DIAGNOSIS — K5900 Constipation, unspecified: Secondary | ICD-10-CM | POA: Diagnosis present

## 2018-06-09 DIAGNOSIS — F0390 Unspecified dementia without behavioral disturbance: Secondary | ICD-10-CM | POA: Diagnosis present

## 2018-06-09 DIAGNOSIS — I480 Paroxysmal atrial fibrillation: Secondary | ICD-10-CM | POA: Diagnosis not present

## 2018-06-09 DIAGNOSIS — N289 Disorder of kidney and ureter, unspecified: Secondary | ICD-10-CM

## 2018-06-09 DIAGNOSIS — D509 Iron deficiency anemia, unspecified: Secondary | ICD-10-CM | POA: Diagnosis present

## 2018-06-09 DIAGNOSIS — N184 Chronic kidney disease, stage 4 (severe): Secondary | ICD-10-CM | POA: Diagnosis present

## 2018-06-09 DIAGNOSIS — R188 Other ascites: Secondary | ICD-10-CM | POA: Diagnosis present

## 2018-06-09 DIAGNOSIS — I5033 Acute on chronic diastolic (congestive) heart failure: Secondary | ICD-10-CM | POA: Diagnosis present

## 2018-06-09 DIAGNOSIS — I130 Hypertensive heart and chronic kidney disease with heart failure and stage 1 through stage 4 chronic kidney disease, or unspecified chronic kidney disease: Secondary | ICD-10-CM | POA: Diagnosis present

## 2018-06-09 DIAGNOSIS — R55 Syncope and collapse: Secondary | ICD-10-CM | POA: Diagnosis present

## 2018-06-09 DIAGNOSIS — I350 Nonrheumatic aortic (valve) stenosis: Secondary | ICD-10-CM | POA: Diagnosis present

## 2018-06-09 DIAGNOSIS — E039 Hypothyroidism, unspecified: Secondary | ICD-10-CM | POA: Diagnosis present

## 2018-06-09 DIAGNOSIS — D539 Nutritional anemia, unspecified: Secondary | ICD-10-CM

## 2018-06-09 DIAGNOSIS — R32 Unspecified urinary incontinence: Secondary | ICD-10-CM | POA: Diagnosis present

## 2018-06-09 DIAGNOSIS — I4891 Unspecified atrial fibrillation: Secondary | ICD-10-CM | POA: Diagnosis present

## 2018-06-09 DIAGNOSIS — Z9049 Acquired absence of other specified parts of digestive tract: Secondary | ICD-10-CM

## 2018-06-09 DIAGNOSIS — R296 Repeated falls: Secondary | ICD-10-CM | POA: Diagnosis present

## 2018-06-09 DIAGNOSIS — Z9884 Bariatric surgery status: Secondary | ICD-10-CM

## 2018-06-09 DIAGNOSIS — N179 Acute kidney failure, unspecified: Secondary | ICD-10-CM | POA: Diagnosis present

## 2018-06-09 DIAGNOSIS — E1129 Type 2 diabetes mellitus with other diabetic kidney complication: Secondary | ICD-10-CM | POA: Diagnosis present

## 2018-06-09 DIAGNOSIS — Z7951 Long term (current) use of inhaled steroids: Secondary | ICD-10-CM

## 2018-06-09 HISTORY — DX: Syncope and collapse: R55

## 2018-06-09 LAB — HEPATIC FUNCTION PANEL
ALBUMIN: 2.5 g/dL — AB (ref 3.5–5.0)
ALT: 21 U/L (ref 0–44)
AST: 34 U/L (ref 15–41)
Alkaline Phosphatase: 85 U/L (ref 38–126)
BILIRUBIN DIRECT: 0.2 mg/dL (ref 0.0–0.2)
Indirect Bilirubin: 0.3 mg/dL (ref 0.3–0.9)
Total Bilirubin: 0.5 mg/dL (ref 0.3–1.2)
Total Protein: 6.5 g/dL (ref 6.5–8.1)

## 2018-06-09 LAB — BASIC METABOLIC PANEL
ANION GAP: 4 — AB (ref 5–15)
BUN: 50 mg/dL — ABNORMAL HIGH (ref 8–23)
CO2: 32 mmol/L (ref 22–32)
Calcium: 8.3 mg/dL — ABNORMAL LOW (ref 8.9–10.3)
Chloride: 104 mmol/L (ref 98–111)
Creatinine, Ser: 1.86 mg/dL — ABNORMAL HIGH (ref 0.44–1.00)
GFR calc Af Amer: 28 mL/min — ABNORMAL LOW (ref >60)
GFR calc non Af Amer: 24 mL/min — ABNORMAL LOW (ref >60)
Glucose, Bld: 141 mg/dL — ABNORMAL HIGH (ref 70–99)
POTASSIUM: 4.1 mmol/L (ref 3.5–5.1)
SODIUM: 140 mmol/L (ref 135–145)

## 2018-06-09 LAB — CBC
HCT: 24.1 % — ABNORMAL LOW (ref 36.0–46.0)
HEMOGLOBIN: 7 g/dL — AB (ref 12.0–15.0)
MCH: 29.2 pg (ref 26.0–34.0)
MCHC: 29 g/dL — ABNORMAL LOW (ref 30.0–36.0)
MCV: 100.4 fL — ABNORMAL HIGH (ref 80.0–100.0)
Platelets: 208 10*3/uL (ref 150–400)
RBC: 2.4 MIL/uL — AB (ref 3.87–5.11)
RDW: 15.5 % (ref 11.5–15.5)
WBC: 7.9 10*3/uL (ref 4.0–10.5)
nRBC: 0 % (ref 0.0–0.2)

## 2018-06-09 LAB — I-STAT TROPONIN, ED: TROPONIN I, POC: 0.01 ng/mL (ref 0.00–0.08)

## 2018-06-09 LAB — CBG MONITORING, ED: Glucose-Capillary: 137 mg/dL — ABNORMAL HIGH (ref 70–99)

## 2018-06-09 MED ORDER — SODIUM CHLORIDE 0.9 % IV SOLN
10.0000 mL/h | Freq: Once | INTRAVENOUS | Status: AC
  Administered 2018-06-10: 10 mL/h via INTRAVENOUS

## 2018-06-09 MED ORDER — SODIUM CHLORIDE 0.9 % IV BOLUS
1000.0000 mL | Freq: Once | INTRAVENOUS | Status: AC
  Administered 2018-06-10: 1000 mL via INTRAVENOUS

## 2018-06-09 NOTE — ED Provider Notes (Signed)
[facility] EMERGENCY DEPARTMENT Provider Note   CSN: [id number] Arrival date & time: 06/09/18  2225     History   Chief Complaint Chief Complaint  Patient presents with  . Fall  . Loss of Consciousness  . Atrial Fibrillation    HPI Madison Riggs is a 84 y.o. female.  The history is provided by the patient and a relative. The history is limited by the condition of the patient (Dementia).  She was brought in by ambulance after having had a syncopal episode at home.  She had been stating that her head and felt funny today.  Her son heard her collapse and found her on the floor.  She has no memory of the incident.  She is complaining of pain in her lower back.  She does have history of diastolic heart failure, hypertension, diabetes, chronic kidney disease, hyperlipidemia, anemia, aortic stenosis.  She denies chest pain, heaviness, tightness, pressure.  She denies palpitations.  Past Medical History:  Diagnosis Date  . Aortic stenosis    moderate  . CKD (chronic kidney disease) stage 3, GFR 30-59 ml/min (HCC)   . Depression   . DM (diabetes mellitus), type 2 ([place])   . Gout   . HTN (hypertension)   . Hypercholesterolemia   . Hypothyroidism   . Iron deficiency anemia   . Sleep apnea    CPAP    Patient Active Problem List   Diagnosis Date Noted  . Contusion of right elbow 03/02/2018  . Pain in joint of right elbow 03/02/2018  . Anasarca 02/28/2018  . CKD (chronic kidney disease), stage IV ([place]) 02/28/2018  . Chronic diastolic HF (heart failure) ([place]) 10/30/2016  . Abnormal EKG 10/30/2016  . Nonrheumatic aortic valve stenosis 10/30/2016  . Anemia 10/16/2016  . Acute renal failure (ARF) ([place]) 10/16/2016  . Hypertension 10/16/2016  . Type II diabetes mellitus with manifestations ([place]) 10/16/2016  . Dyspnea and respiratory abnormalities 06/02/2016    Past Surgical History:  Procedure Laterality Date  . BACK SURGERY  1984  . CATARACT EXTRACTION,  BILATERAL  1997  . CHOLECYSTECTOMY  2001  . ESOPHAGOGASTRODUODENOSCOPY N/A 10/18/2016   Procedure: ESOPHAGOGASTRODUODENOSCOPY (EGD);  Surgeon: Teena Irani, MD;  Location: [facility];  Service: Endoscopy;  Laterality: N/A;  . GASTRIC BYPASS  1999  . IR PARACENTESIS  02/09/2017  . KNEE SURGERY Left 1994  . THORACENTESIS Right    in [place], [place]  . TONSILLECTOMY       OB History   No obstetric history on file.      Home Medications    Prior to Admission medications   Medication Sig Start Date End Date Taking? Authorizing Provider  acetaminophen (TYLENOL) 500 MG tablet Take 500 mg by mouth every 6 (six) hours as needed for moderate pain.    [provider]  allopurinol (ZYLOPRIM) 100 MG tablet Take 100 mg by mouth 2 (two) times daily.     [provider]  b complex vitamins tablet Take 1 tablet by mouth daily.    [provider]  calcium carbonate (OS-CAL) 600 MG TABS tablet Take 600 mg by mouth daily.    [provider]  cetirizine (ZYRTEC) 10 MG tablet Take 10 mg by mouth at bedtime.     [provider]  Cyanocobalamin (VITAMIN B-12) 5000 MCG SUBL Place 5,000 mcg under the tongue daily.    [provider]  fluticasone (FLONASE) 50 MCG/ACT nasal spray Place 1 spray into both nostrils daily as  needed. 11/18/16   [provider]  folic acid (FOLVITE) 1 MG tablet Take 1 mg by mouth every morning.     [provider]  KLOR-CON M20 20 MEQ tablet TAKE 1 TABLET BY MOUTH TWICE A DAY 06/03/18   Minus Breeding, MD  Lactobacillus (ACIDOPHILUS) 100 MG CAPS Take 100 mg by mouth every morning.     [provider]  Magnesium 400 MG TABS Take 1 tablet by mouth daily.    [provider]  metolazone (ZAROXOLYN) 2.5 MG tablet Take 1 tablet (2.5 mg total) by mouth daily as needed. Take 4min before Torsemide 02/28/18 05/29/18  Minus Breeding, MD  Multiple Vitamins-Minerals (WOMENS MULTIVITAMIN PO) Take 1 tablet by  mouth daily.    [provider]  pantoprazole (PROTONIX) 40 MG tablet Take 1 tablet (40 mg total) by mouth 2 (two) times daily. 10/19/16   Doreatha Lew, MD  senna-docusate (SENOKOT-S) 8.6-50 MG tablet Take 1 tablet by mouth 2 (two) times daily. Patient taking differently: Take 2 tablets by mouth at bedtime.  10/19/16   Doreatha Lew, MD  simvastatin (ZOCOR) 40 MG tablet Take 20 mg by mouth daily.    [provider]  SYNTHROID 100 MCG tablet Take 100 mcg by mouth daily before breakfast.  07/12/17   [provider]  torsemide (DEMADEX) 20 MG tablet TAKE 2 TABLETS (40 MG TOTAL) BY MOUTH 2 (TWO) TIMES DAILY. 05/17/18 06/16/18  Minus Breeding, MD  traZODone (DESYREL) 50 MG tablet Take 100 mg by mouth at bedtime.     [provider]  triamcinolone cream (KENALOG) 0.1 % Apply 1 application topically 2 (two) times daily. 03/23/17   [provider]  Turmeric 500 MG TABS Take 500 mg by mouth every morning.     [provider]  venlafaxine (EFFEXOR) 75 MG tablet Take 75 mg by mouth daily.     [provider]    Family History Family History  Problem Relation Age of Onset  . Breast cancer Mother   . Colon cancer Father   . Prostate cancer Father   . Emphysema Father     Social History Social History   Tobacco Use  . Smoking status: Never Smoker  . Smokeless tobacco: Never Used  Substance Use Topics  . Alcohol use: No  . Drug use: No     Allergies   Patient has no known allergies.   Review of Systems Review of Systems  Unable to perform ROS: Dementia     Physical Exam Updated Vital Signs BP 100/69 (BP Location: Right Arm)   Pulse (!) 135   Temp 97.9 F (36.6 C) (Oral)   Resp 18   Ht 5\' 2"  (1.575 m)   Wt 61.2 kg   SpO2 99%   BMI 24.69 kg/m   Physical Exam Vitals signs and nursing note reviewed.    84 year old female, resting comfortably and in no acute distress. Vital signs are significant for rapid  heart rate. Oxygen saturation is 99%, which is normal. Head is normocephalic and atraumatic. PERRLA, EOMI. Oropharynx is clear. Neck is nontender without adenopathy or JVD. Back is tender in the mid lumbar area.  There is no CVA tenderness. Lungs are clear without rales, wheezes, or rhonchi. Chest is nontender. Heart has an irregularly irregular tachycardic rhythm.  There is a 7-8/4 systolic ejection murmur best heard along left sternal border. Abdomen is soft, flat, nontender without masses or hepatosplenomegaly and peristalsis is normoactive. Extremities have no  cyanosis or edema, full range of motion is present. Skin is warm and dry without rash. Neurologic: Awake and alert and oriented to person and place but not time, cranial nerves are intact, there are no motor or sensory deficits.  ED Treatments / Results  Labs (all labs ordered are listed, but only abnormal results are displayed) Labs Reviewed  BASIC METABOLIC PANEL - Abnormal; Notable for the following components:      Result Value   Glucose, Bld 141 (*)    BUN 50 (*)    Creatinine, Ser 1.86 (*)    Calcium 8.3 (*)    GFR calc non Af Amer 24 (*)    GFR calc Af Amer 28 (*)    Anion gap 4 (*)    All other components within normal limits  CBC - Abnormal; Notable for the following components:   RBC 2.40 (*)    Hemoglobin 7.0 (*)    HCT 24.1 (*)    MCV 100.4 (*)    MCHC 29.0 (*)    All other components within normal limits  HEPATIC FUNCTION PANEL - Abnormal; Notable for the following components:   Albumin 2.5 (*)    All other components within normal limits  RETICULOCYTES - Abnormal; Notable for the following components:   RBC. 2.26 (*)    Immature Retic Fract 27.5 (*)    All other components within normal limits  CBG MONITORING, ED - Abnormal; Notable for the following components:   Glucose-Capillary 137 (*)    All other components within normal limits  URINALYSIS, ROUTINE W REFLEX MICROSCOPIC  VITAMIN B12  FOLATE    IRON AND TIBC  FERRITIN  HEPARIN LEVEL (UNFRACTIONATED)  CBC  I-STAT TROPONIN, ED  POC OCCULT BLOOD, ED  TYPE AND SCREEN  PREPARE RBC (CROSSMATCH)  ABO/RH    EKG EKG Interpretation  Date/Time:  Thursday June 09 2018 22:26:16 EST Ventricular Rate:  130 PR Interval:    QRS Duration: 57 QT Interval:  353 QTC Calculation: 520 R Axis:   22 Text Interpretation:  Atrial fibrillation Low voltage, extremity and precordial leads Nonspecific T abnormalities, lateral leads Prolonged QT interval Confirmed by Quintella Reichert [phone]) on 06/09/2018 10:42:09 PM   Radiology Dg Chest 2 View  Result Date: 06/09/2018 CLINICAL DATA:  Recent syncopal episode with fall at home EXAM: CHEST - 2 VIEW COMPARISON:  None. FINDINGS: Cardiac shadow is mildly enlarged. Aortic calcifications are seen. Mild central vascular congestion is noted without interstitial edema. Small right-sided pleural effusion is noted laterally. No focal infiltrate is seen. Degenerative changes of the thoracic spine are noted. IMPRESSION: Mild central vascular congestion with small right-sided pleural effusion. Electronically Signed   By: Inez Catalina M.D.   On: 06/09/2018 23:26   Dg Lumbar Spine Complete  Result Date: 06/09/2018 CLINICAL DATA:  Recent syncopal episode with low back pain, initial encounter EXAM: LUMBAR SPINE - COMPLETE 4+ VIEW COMPARISON:  None. FINDINGS: Vertebral body height is well maintained. Mild osteophytic changes are seen. L5 is partially sacralized. Mild degenerative anterolisthesis of L4 on L5 is noted. No soft tissue abnormality is noted. Aortic calcifications are seen. IMPRESSION: Mild degenerative change without acute abnormality. Electronically Signed   By: Inez Catalina M.D.   On: 06/09/2018 23:50   Ct Head Wo Contrast  Result Date: 06/10/2018 CLINICAL DATA:  Status post syncope and fall. Confusion. Concern for head or cervical spine injury. Initial encounter. EXAM: CT HEAD WITHOUT CONTRAST CT  CERVICAL SPINE WITHOUT CONTRAST TECHNIQUE: Multidetector CT imaging  of the head and cervical spine was performed following the standard protocol without intravenous contrast. Multiplanar CT image reconstructions of the cervical spine were also generated. COMPARISON:  None. FINDINGS: CT HEAD FINDINGS Brain: No evidence of acute infarction, hemorrhage, hydrocephalus, extra-axial collection or mass lesion / mass effect. Prominence of the ventricles and sulci reflects mild age-appropriate cortical volume loss. Mild periventricular and white matter change likely reflects small vessel ischemic microangiopathy. The brainstem and fourth ventricle are within normal limits. The basal ganglia are unremarkable in appearance. The cerebral hemispheres demonstrate grossly normal gray-white differentiation. No mass effect or midline shift is seen. Vascular: No hyperdense vessel or unexpected calcification. Skull: There is no evidence of fracture; visualized osseous structures are unremarkable in appearance. Sinuses/Orbits: The visualized portions of the orbits are within normal limits. The paranasal sinuses and mastoid air cells are well-aerated. Other: No significant soft tissue abnormalities are seen. CT CERVICAL SPINE FINDINGS Alignment: Normal. Skull base and vertebrae: No acute fracture. No primary bone lesion or focal pathologic process. Soft tissues and spinal canal: No prevertebral fluid or swelling. No visible canal hematoma. Disc levels: Intervertebral disc spaces are preserved. The bony foramina are grossly unremarkable. Underlying facet disease is noted. Upper chest: The visualized lung apices are clear. The thyroid gland is only partially characterized but appears grossly unremarkable. Scattered calcification is noted at the carotid bifurcations bilaterally. Other: No additional soft tissue abnormalities are seen. IMPRESSION: 1. No evidence of traumatic intracranial injury or fracture. 2. No evidence of fracture or  subluxation along the cervical spine. 3. Mild age-appropriate cortical volume loss and scattered small vessel ischemic microangiopathy. 4. Scattered calcification at the carotid bifurcations bilaterally. Carotid ultrasound would be helpful for further evaluation, when and as deemed clinically appropriate. Electronically Signed   By: Garald Balding M.D.   On: 06/10/2018 00:21   Ct Cervical Spine Wo Contrast  Result Date: 06/10/2018 CLINICAL DATA:  Status post syncope and fall. Confusion. Concern for head or cervical spine injury. Initial encounter. EXAM: CT HEAD WITHOUT CONTRAST CT CERVICAL SPINE WITHOUT CONTRAST TECHNIQUE: Multidetector CT imaging of the head and cervical spine was performed following the standard protocol without intravenous contrast. Multiplanar CT image reconstructions of the cervical spine were also generated. COMPARISON:  None. FINDINGS: CT HEAD FINDINGS Brain: No evidence of acute infarction, hemorrhage, hydrocephalus, extra-axial collection or mass lesion / mass effect. Prominence of the ventricles and sulci reflects mild age-appropriate cortical volume loss. Mild periventricular and white matter change likely reflects small vessel ischemic microangiopathy. The brainstem and fourth ventricle are within normal limits. The basal ganglia are unremarkable in appearance. The cerebral hemispheres demonstrate grossly normal gray-white differentiation. No mass effect or midline shift is seen. Vascular: No hyperdense vessel or unexpected calcification. Skull: There is no evidence of fracture; visualized osseous structures are unremarkable in appearance. Sinuses/Orbits: The visualized portions of the orbits are within normal limits. The paranasal sinuses and mastoid air cells are well-aerated. Other: No significant soft tissue abnormalities are seen. CT CERVICAL SPINE FINDINGS Alignment: Normal. Skull base and vertebrae: No acute fracture. No primary bone lesion or focal pathologic process. Soft  tissues and spinal canal: No prevertebral fluid or swelling. No visible canal hematoma. Disc levels: Intervertebral disc spaces are preserved. The bony foramina are grossly unremarkable. Underlying facet disease is noted. Upper chest: The visualized lung apices are clear. The thyroid gland is only partially characterized but appears grossly unremarkable. Scattered calcification is noted at the carotid bifurcations bilaterally. Other: No additional soft tissue abnormalities are seen.  IMPRESSION: 1. No evidence of traumatic intracranial injury or fracture. 2. No evidence of fracture or subluxation along the cervical spine. 3. Mild age-appropriate cortical volume loss and scattered small vessel ischemic microangiopathy. 4. Scattered calcification at the carotid bifurcations bilaterally. Carotid ultrasound would be helpful for further evaluation, when and as deemed clinically appropriate. Electronically Signed   By: Garald Balding M.D.   On: 06/10/2018 00:21    Procedures Procedures  CRITICAL CARE Performed by: Delora Fuel Total critical care time: 50 minutes Critical care time was exclusive of separately billable procedures and treating other patients. Critical care was necessary to treat or prevent imminent or life-threatening deterioration. Critical care was time spent personally by me on the following activities: development of treatment plan with patient and/or surrogate as well as nursing, discussions with consultants, evaluation of patient's response to treatment, examination of patient, obtaining history from patient or surrogate, ordering and performing treatments and interventions, ordering and review of laboratory studies, ordering and review of radiographic studies, pulse oximetry and re-evaluation of patient's condition.  Medications Ordered in ED Medications  0.9 %  sodium chloride infusion (has no administration in time range)  heparin ADULT infusion 100 units/mL (25000 units/257mL sodium  chloride 0.45%) (has no administration in time range)  sodium chloride 0.9 % bolus 1,000 mL (0 mLs Intravenous Stopped 06/10/18 0129)     Initial Impression / Assessment and Plan / ED Course  I have reviewed the triage vital signs and the nursing notes.  Pertinent labs & imaging results that were available during my care of the patient were reviewed by me and considered in my medical decision making (see chart for details).  Syncope.  Atrial fibrillation with rapid ventricular response.  Family is not aware of prior history of atrial fibrillation.  Review of old records shows that she has been followed by cardiology for heart failure and aortic stenosis, but no history of atrial fibrillation.  Patient was not aware of her heart rhythm, so she is not a candidate for immediate cardioversion.  Unfortunately, blood pressure is borderline and I do not feel that she can tolerate either beta-blockers or calcium channel blockers at this point.  Labs show hemoglobin 7.0 which is drop from 9.9 on September 30.  She supposedly carries a diagnosis of iron deficiency anemia, but MCV is high.  She has prior gastric bypass surgery and I suspect that she is B12 deficient as well.  Blood is drawn for anemia panel and she is given blood transfusion of 2 units of blood.  Renal insufficiency is noted, slightly worse than baseline.  This may be related to her atrial fibrillation, also possibly related to her anemia.  She will be sent for CT of head and cervical spine as well as plain x-rays of her lumbar spine.  Chest x-ray showed cardiomegaly.  If no evidence of bleeding, she can be started on anticoagulants.  If blood pressure comes up, we will try cautious use of beta-blockers.  CT shows no acute process.  Lumbar spine x-rays show no acute process.  Blood pressure has not come up with IV fluids, RBC transfusion pending.  Case is discussed with Dr. Blaine Hamper of [facility] hospitalist who agrees to admit the patient, discussed with  Dr. Massie Bougie of cardiology service who agrees to see the patient in consultation.  CHA2DS2/VAS Stroke Risk Points  Current as of 7 minutes ago     6 >= 2 Points: High Risk  1 - 1.99 Points: Medium Risk  0 Points:  Low Risk    The patient's score has not changed in the past year.:  No Change     Details    This score determines the patient's risk of having a stroke if the  patient has atrial fibrillation.       Points Metrics  1 Has Congestive Heart Failure:  Yes    Current as of 7 minutes ago  0 Has Vascular Disease:  No    Current as of 7 minutes ago  1 Has Hypertension:  Yes    Current as of 7 minutes ago  2 Age:  80    Current as of 7 minutes ago  1 Has Diabetes:  Yes    Current as of 7 minutes ago  0 Had Stroke:  No  Had TIA:  No  Had thromboembolism:  No    Current as of 7 minutes ago  1 Female:  Yes    Current as of 7 minutes ago    Final Clinical Impressions(s) / ED Diagnoses   Final diagnoses:  Atrial fibrillation with rapid ventricular response (HCC)  Hypotension, unspecified hypotension type  Macrocytic anemia  Renal insufficiency    ED Discharge Orders    None       Delora Fuel, MD 16/60/63 [phone]

## 2018-06-09 NOTE — ED Triage Notes (Signed)
  Patient BIB EMS after syncopal episode and fall at home.  Patient has hx of dementia and was getting up to get a drink of water.  Patient does not remember falling but family heard the fall and came to find her on the floor.  Patient was confused at first but is not A&O x4.  Patient states she has low back pain.  Pain 5/10.  Patient was also in afib with RVR on EMS arrival with no history of such.  HR 135 afib

## 2018-06-10 ENCOUNTER — Encounter (HOSPITAL_COMMUNITY): Payer: Self-pay | Admitting: Internal Medicine

## 2018-06-10 ENCOUNTER — Other Ambulatory Visit: Payer: Self-pay

## 2018-06-10 ENCOUNTER — Ambulatory Visit (HOSPITAL_COMMUNITY): Payer: Medicare Other

## 2018-06-10 DIAGNOSIS — I4891 Unspecified atrial fibrillation: Secondary | ICD-10-CM

## 2018-06-10 DIAGNOSIS — E785 Hyperlipidemia, unspecified: Secondary | ICD-10-CM | POA: Diagnosis present

## 2018-06-10 DIAGNOSIS — I5032 Chronic diastolic (congestive) heart failure: Secondary | ICD-10-CM | POA: Diagnosis not present

## 2018-06-10 DIAGNOSIS — K219 Gastro-esophageal reflux disease without esophagitis: Secondary | ICD-10-CM

## 2018-06-10 DIAGNOSIS — N184 Chronic kidney disease, stage 4 (severe): Secondary | ICD-10-CM | POA: Diagnosis not present

## 2018-06-10 DIAGNOSIS — I10 Essential (primary) hypertension: Secondary | ICD-10-CM | POA: Diagnosis not present

## 2018-06-10 DIAGNOSIS — R55 Syncope and collapse: Secondary | ICD-10-CM

## 2018-06-10 DIAGNOSIS — D649 Anemia, unspecified: Secondary | ICD-10-CM | POA: Diagnosis not present

## 2018-06-10 DIAGNOSIS — R195 Other fecal abnormalities: Secondary | ICD-10-CM

## 2018-06-10 DIAGNOSIS — I482 Chronic atrial fibrillation, unspecified: Secondary | ICD-10-CM | POA: Diagnosis present

## 2018-06-10 DIAGNOSIS — E039 Hypothyroidism, unspecified: Secondary | ICD-10-CM | POA: Diagnosis present

## 2018-06-10 DIAGNOSIS — W19XXXA Unspecified fall, initial encounter: Secondary | ICD-10-CM | POA: Diagnosis present

## 2018-06-10 HISTORY — DX: Syncope and collapse: R55

## 2018-06-10 LAB — BASIC METABOLIC PANEL
Anion gap: 13 (ref 5–15)
BUN: 47 mg/dL — ABNORMAL HIGH (ref 8–23)
CO2: 24 mmol/L (ref 22–32)
Calcium: 8 mg/dL — ABNORMAL LOW (ref 8.9–10.3)
Chloride: 104 mmol/L (ref 98–111)
Creatinine, Ser: 1.68 mg/dL — ABNORMAL HIGH (ref 0.44–1.00)
GFR calc Af Amer: 31 mL/min — ABNORMAL LOW (ref >60)
GFR, EST NON AFRICAN AMERICAN: 27 mL/min — AB (ref >60)
Glucose, Bld: 111 mg/dL — ABNORMAL HIGH (ref 70–99)
Potassium: 3.9 mmol/L (ref 3.5–5.1)
Sodium: 141 mmol/L (ref 135–145)

## 2018-06-10 LAB — CBC
HCT: 23.2 % — ABNORMAL LOW (ref 36.0–46.0)
HCT: 25.7 % — ABNORMAL LOW (ref 36.0–46.0)
HCT: 29.4 % — ABNORMAL LOW (ref 36.0–46.0)
HEMATOCRIT: 30.2 % — AB (ref 36.0–46.0)
HEMOGLOBIN: 6.8 g/dL — AB (ref 12.0–15.0)
Hemoglobin: 8 g/dL — ABNORMAL LOW (ref 12.0–15.0)
Hemoglobin: 9.4 g/dL — ABNORMAL LOW (ref 12.0–15.0)
Hemoglobin: 9.6 g/dL — ABNORMAL LOW (ref 12.0–15.0)
MCH: 29.6 pg (ref 26.0–34.0)
MCH: 30.3 pg (ref 26.0–34.0)
MCH: 29.8 pg (ref 26.0–34.0)
MCH: 29.7 pg (ref 26.0–34.0)
MCHC: 29.3 g/dL — ABNORMAL LOW (ref 30.0–36.0)
MCHC: 31.1 g/dL (ref 30.0–36.0)
MCHC: 32 g/dL (ref 30.0–36.0)
MCHC: 31.8 g/dL (ref 30.0–36.0)
MCV: 100.9 fL — ABNORMAL HIGH (ref 80.0–100.0)
MCV: 97.3 fL (ref 80.0–100.0)
MCV: 93.3 fL (ref 80.0–100.0)
MCV: 93.5 fL (ref 80.0–100.0)
Platelets: 202 10*3/uL (ref 150–400)
Platelets: 200 10*3/uL (ref 150–400)
Platelets: 193 10*3/uL (ref 150–400)
Platelets: 208 10*3/uL (ref 150–400)
RBC: 2.3 MIL/uL — ABNORMAL LOW (ref 3.87–5.11)
RBC: 2.64 MIL/uL — ABNORMAL LOW (ref 3.87–5.11)
RBC: 3.15 MIL/uL — ABNORMAL LOW (ref 3.87–5.11)
RBC: 3.23 MIL/uL — ABNORMAL LOW (ref 3.87–5.11)
RDW: 15.7 % — ABNORMAL HIGH (ref 11.5–15.5)
RDW: 15.6 % — ABNORMAL HIGH (ref 11.5–15.5)
RDW: 17.2 % — ABNORMAL HIGH (ref 11.5–15.5)
RDW: 17.5 % — ABNORMAL HIGH (ref 11.5–15.5)
WBC: 7.8 10*3/uL (ref 4.0–10.5)
WBC: 6.9 10*3/uL (ref 4.0–10.5)
WBC: 6.5 10*3/uL (ref 4.0–10.5)
WBC: 7.5 10*3/uL (ref 4.0–10.5)
nRBC: 0 % (ref 0.0–0.2)
nRBC: 0.3 % — ABNORMAL HIGH (ref 0.0–0.2)
nRBC: 0 % (ref 0.0–0.2)
nRBC: 0 % (ref 0.0–0.2)

## 2018-06-10 LAB — URINALYSIS, ROUTINE W REFLEX MICROSCOPIC
BILIRUBIN URINE: NEGATIVE
GLUCOSE, UA: NEGATIVE mg/dL
Hgb urine dipstick: NEGATIVE
KETONES UR: NEGATIVE mg/dL
Leukocytes, UA: NEGATIVE
NITRITE: NEGATIVE
PH: 5 (ref 5.0–8.0)
Protein, ur: NEGATIVE mg/dL
SPECIFIC GRAVITY, URINE: 1.015 (ref 1.005–1.030)

## 2018-06-10 LAB — FOLATE: Folate: 13.9 ng/mL (ref >5.9)

## 2018-06-10 LAB — PREPARE RBC (CROSSMATCH)

## 2018-06-10 LAB — TROPONIN I
Troponin I: <0.03 ng/mL (ref <0.03)
Troponin I: <0.03 ng/mL (ref <0.03)
Troponin I: <0.03 ng/mL (ref <0.03)

## 2018-06-10 LAB — IRON AND TIBC
Iron: 23 ug/dL — ABNORMAL LOW (ref 28–170)
Saturation Ratios: 8 % — ABNORMAL LOW (ref 10.4–31.8)
TIBC: 273 ug/dL (ref 250–450)
UIBC: 250 ug/dL

## 2018-06-10 LAB — RETICULOCYTES
Immature Retic Fract: 27.5 % — ABNORMAL HIGH (ref 2.3–15.9)
RBC.: 2.26 MIL/uL — ABNORMAL LOW (ref 3.87–5.11)
Retic Count, Absolute: 66.9 10*3/uL (ref 19.0–186.0)
Retic Ct Pct: 3 % (ref 0.4–3.1)

## 2018-06-10 LAB — BRAIN NATRIURETIC PEPTIDE: B Natriuretic Peptide: 378.9 pg/mL — ABNORMAL HIGH (ref 0.0–100.0)

## 2018-06-10 LAB — FERRITIN: Ferritin: 73 ng/mL (ref 11–307)

## 2018-06-10 LAB — TSH: TSH: 2.504 u[IU]/mL (ref 0.350–4.500)

## 2018-06-10 LAB — MAGNESIUM: Magnesium: 2.5 mg/dL — ABNORMAL HIGH (ref 1.7–2.4)

## 2018-06-10 LAB — POC OCCULT BLOOD, ED: Fecal Occult Bld: POSITIVE — AB

## 2018-06-10 LAB — VITAMIN B12: Vitamin B-12: 6913 pg/mL — ABNORMAL HIGH (ref 180–914)

## 2018-06-10 LAB — ABO/RH: ABO/RH(D): B POS

## 2018-06-10 MED ORDER — HYDROXYZINE HCL 10 MG PO TABS: 10.0000 mg | ORAL_TABLET | Freq: Three times a day (TID) | ORAL | Status: DC | PRN

## 2018-06-10 MED ORDER — VENLAFAXINE HCL ER 75 MG PO CP24
75.0000 mg | ORAL_CAPSULE | Freq: Every day | ORAL | Status: DC
  Administered 2018-06-10 – 2018-06-20 (×11): 75 mg via ORAL
  Filled 2018-06-10 (×11): qty 1

## 2018-06-10 MED ORDER — SIMVASTATIN 20 MG PO TABS
20.0000 mg | ORAL_TABLET | Freq: Every day | ORAL | Status: DC
  Administered 2018-06-10 – 2018-06-20 (×11): 20 mg via ORAL
  Filled 2018-06-10 (×11): qty 1

## 2018-06-10 MED ORDER — FLUTICASONE PROPIONATE 50 MCG/ACT NA SUSP
1.0000 | Freq: Every day | NASAL | Status: DC
  Administered 2018-06-10 – 2018-06-20 (×11): 1 via NASAL
  Filled 2018-06-10: qty 16

## 2018-06-10 MED ORDER — ALLOPURINOL 100 MG PO TABS
100.0000 mg | ORAL_TABLET | Freq: Two times a day (BID) | ORAL | Status: DC
  Administered 2018-06-10 – 2018-06-20 (×21): 100 mg via ORAL
  Filled 2018-06-10 (×21): qty 1

## 2018-06-10 MED ORDER — VITAMIN B-12 1000 MCG PO TABS
5000.0000 ug | ORAL_TABLET | Freq: Every day | ORAL | Status: DC
  Administered 2018-06-10 – 2018-06-20 (×10): 5000 ug via ORAL
  Filled 2018-06-10 (×9): qty 5

## 2018-06-10 MED ORDER — HYDRALAZINE HCL 20 MG/ML IJ SOLN: 5.0000 mg | INTRAMUSCULAR | Status: DC | PRN

## 2018-06-10 MED ORDER — ADULT MULTIVITAMIN W/MINERALS CH
1.0000 | ORAL_TABLET | Freq: Every day | ORAL | Status: DC
  Administered 2018-06-10 – 2018-06-20 (×11): 1 via ORAL
  Filled 2018-06-10 (×11): qty 1

## 2018-06-10 MED ORDER — SODIUM CHLORIDE 0.9 % IV BOLUS
250.0000 mL | Freq: Once | INTRAVENOUS | Status: AC
  Administered 2018-06-10: 250 mL via INTRAVENOUS

## 2018-06-10 MED ORDER — FERROUS SULFATE 325 (65 FE) MG PO TABS
325.0000 mg | ORAL_TABLET | Freq: Every day | ORAL | Status: DC
  Administered 2018-06-10 – 2018-06-20 (×11): 325 mg via ORAL
  Filled 2018-06-10 (×11): qty 1

## 2018-06-10 MED ORDER — ZOLPIDEM TARTRATE 5 MG PO TABS
5.0000 mg | ORAL_TABLET | Freq: Every evening | ORAL | Status: DC | PRN
  Administered 2018-06-10 – 2018-06-15 (×3): 5 mg via ORAL
  Filled 2018-06-10 (×3): qty 1

## 2018-06-10 MED ORDER — TRAZODONE HCL 100 MG PO TABS
100.0000 mg | ORAL_TABLET | Freq: Every day | ORAL | Status: DC
  Administered 2018-06-10 – 2018-06-19 (×10): 100 mg via ORAL
  Filled 2018-06-10 (×10): qty 1

## 2018-06-10 MED ORDER — LACTINEX PO CHEW
1.0000 | CHEWABLE_TABLET | Freq: Every day | ORAL | Status: DC
  Administered 2018-06-10 – 2018-06-19 (×9): 1 via ORAL
  Filled 2018-06-10 (×15): qty 1

## 2018-06-10 MED ORDER — PANTOPRAZOLE SODIUM 40 MG PO TBEC
40.0000 mg | DELAYED_RELEASE_TABLET | Freq: Two times a day (BID) | ORAL | Status: DC
  Administered 2018-06-10 – 2018-06-20 (×22): 40 mg via ORAL
  Filled 2018-06-10 (×22): qty 1

## 2018-06-10 MED ORDER — ACETAMINOPHEN 325 MG PO TABS
650.0000 mg | ORAL_TABLET | Freq: Four times a day (QID) | ORAL | Status: DC | PRN
  Administered 2018-06-10 – 2018-06-20 (×9): 650 mg via ORAL
  Filled 2018-06-10 (×9): qty 2

## 2018-06-10 MED ORDER — LEVOTHYROXINE SODIUM 100 MCG PO TABS
100.0000 ug | ORAL_TABLET | Freq: Every day | ORAL | Status: DC
  Administered 2018-06-10 – 2018-06-20 (×11): 100 ug via ORAL
  Filled 2018-06-10 (×11): qty 1

## 2018-06-10 MED ORDER — CALCIUM CARBONATE 1250 (500 CA) MG PO TABS
1.0000 | ORAL_TABLET | Freq: Every day | ORAL | Status: DC
  Administered 2018-06-10 – 2018-06-20 (×11): 500 mg via ORAL
  Filled 2018-06-10 (×11): qty 1

## 2018-06-10 MED ORDER — SENNOSIDES-DOCUSATE SODIUM 8.6-50 MG PO TABS
1.0000 | ORAL_TABLET | Freq: Every day | ORAL | Status: DC
  Administered 2018-06-10 – 2018-06-14 (×5): 1 via ORAL
  Filled 2018-06-10 (×5): qty 1

## 2018-06-10 MED ORDER — HEPARIN (PORCINE) 25000 UT/250ML-% IV SOLN
900.0000 [IU]/h | INTRAVENOUS | Status: DC
  Filled 2018-06-10: qty 250

## 2018-06-10 MED ORDER — ACETAMINOPHEN 650 MG RE SUPP: 650.0000 mg | Freq: Four times a day (QID) | RECTAL | Status: DC | PRN

## 2018-06-10 MED ORDER — LORATADINE 10 MG PO TABS
10.0000 mg | ORAL_TABLET | Freq: Every day | ORAL | Status: DC
  Administered 2018-06-10 – 2018-06-20 (×11): 10 mg via ORAL
  Filled 2018-06-10 (×11): qty 1

## 2018-06-10 MED ORDER — TORSEMIDE 20 MG PO TABS: 40.0000 mg | ORAL_TABLET | Freq: Two times a day (BID) | ORAL | Status: DC

## 2018-06-10 MED ORDER — MAGNESIUM OXIDE 400 (241.3 MG) MG PO TABS
400.0000 mg | ORAL_TABLET | Freq: Every day | ORAL | Status: DC
  Administered 2018-06-10 – 2018-06-20 (×11): 400 mg via ORAL
  Filled 2018-06-10 (×11): qty 1

## 2018-06-10 MED ORDER — TURMERIC 500 MG PO TABS: 500.0000 mg | ORAL_TABLET | Freq: Every day | ORAL | Status: DC

## 2018-06-10 NOTE — Progress Notes (Signed)
ANTICOAGULATION CONSULT NOTE - Initial Consult  Pharmacy Consult for heparin Indication: atrial fibrillation  No Known Allergies  Patient Measurements: Height: 5\' 2"  (157.5 cm) Weight: 135 lb (61.2 kg) IBW/kg (Calculated) : 50.1  Vital Signs: Temp: 97.9 F (36.6 C) (12/26 2228) Temp Source: Oral (12/26 2228) BP: 104/69 (12/27 0030) Pulse Rate: 128 (12/27 0030)  Labs: Recent Labs    06/09/18 2238  HGB 7.0*  HCT 24.1*  PLT 208  CREATININE 1.86*    Estimated Creatinine Clearance: 18.3 mL/min (A) (by C-G formula based on SCr of 1.86 mg/dL (H)).   Medical History: Past Medical History:  Diagnosis Date  . Aortic stenosis    moderate  . CKD (chronic kidney disease) stage 3, GFR 30-59 ml/min (HCC)   . Depression   . DM (diabetes mellitus), type 2 ([place])   . Gout   . HTN (hypertension)   . Hypercholesterolemia   . Hypothyroidism   . Iron deficiency anemia   . Sleep apnea    CPAP    Assessment: 84yo female presents s/p syncopal episode > fall, found to be in Afib with no known h/o Afib, Hgb low at 7, last 9.9 in Sept, no overt signs of bleeding and CT clear, known h/o anemia, rec'ing 2 units, to begin heparin.  Goal of Therapy:  Heparin level 0.3-0.7 units/ml Monitor platelets by anticoagulation protocol: Yes   Plan:  Will start heparin gtt at 900 units/hr without bolus and monitor heparin levels and CBC.  Wynona Neat, PharmD, BCPS  06/10/2018,12:49 AM

## 2018-06-10 NOTE — Consult Note (Signed)
[facility] Consultation Note  Referring Provider: Dr. Marzetta Board [facility]) Primary Care Physician:  Rolene Course, PA-C  Reason for Consultation:  Blood in stool, anemia  HPI: Madison Riggs is a 84 y.o. female chronic anemia on iv iron, chronic low-grade blood in stool, presents with weakness and near-syncope. No abdominal pain.  No hematemesis.  Prior admissions and evaluations for anemia and blood in stool.  Based on her comorbidities especially aortic stenosis, as well as her age, patient has previously declined colonoscopy.   Past Medical History:  Diagnosis Date  . Aortic stenosis    moderate  . CKD (chronic kidney disease) stage 3, GFR 30-59 ml/min (HCC)   . Depression   . DM (diabetes mellitus), type 2 ([place])   . Gout   . HTN (hypertension)   . Hypercholesterolemia   . Hypothyroidism   . Iron deficiency anemia   . Sleep apnea    CPAP  . Syncope 06/10/2018    Past Surgical History:  Procedure Laterality Date  . BACK SURGERY  1984  . CATARACT EXTRACTION, BILATERAL  1997  . CHOLECYSTECTOMY  2001  . ESOPHAGOGASTRODUODENOSCOPY N/A 10/18/2016   Procedure: ESOPHAGOGASTRODUODENOSCOPY (EGD);  Surgeon: Teena Irani, MD;  Location: [facility];  Service: Endoscopy;  Laterality: N/A;  . GASTRIC BYPASS  1999  . IR PARACENTESIS  02/09/2017  . KNEE SURGERY Left 1994  . THORACENTESIS Right    in [place], [place]      Prior to Admission medications   Medication Sig Start Date End Date Taking? Authorizing Provider  acetaminophen (TYLENOL) 500 MG tablet Take 1,000 mg by mouth every 6 (six) hours as needed for moderate pain.    Yes [provider]  allopurinol (ZYLOPRIM) 100 MG tablet Take 100 mg by mouth 2 (two) times daily.    Yes [provider]  calcium carbonate (OS-CAL) 600 MG TABS tablet Take 600 mg by mouth daily.   Yes [provider]  cetirizine (ZYRTEC) 10 MG tablet Take 10 mg by mouth at bedtime.    Yes [provider]  Cyanocobalamin (VITAMIN B-12) 5000 MCG SUBL Place 5,000 mcg under the tongue daily.   Yes [provider]  Ferrous Sulfate Dried (SLOW RELEASE IRON) 45 MG TBCR Take 45 mg by mouth daily.   Yes [provider]  fluticasone (FLONASE) 50 MCG/ACT nasal spray Place 1 spray into both nostrils daily.  11/18/16  Yes [provider]  KLOR-CON M20 20 MEQ tablet TAKE 1 TABLET BY MOUTH TWICE A DAY Patient taking differently: Take 20 mEq by mouth daily.  06/03/18  Yes Minus Breeding, MD  Lactobacillus (ACIDOPHILUS) 100 MG CAPS Take 100 mg by mouth daily.    Yes [provider]  Magnesium 400 MG TABS Take 400 mg by mouth daily.    Yes [provider]  Multiple Vitamins-Minerals (WOMENS MULTIVITAMIN PO) Take 1 tablet by mouth daily.   Yes [provider]  pantoprazole (PROTONIX) 40 MG tablet Take 1 tablet (40 mg total) by mouth 2 (two) times daily. Patient taking differently: Take 40 mg by mouth daily.  10/19/16  Yes Patrecia Pour, Christean Grief, MD  senna-docusate (SENOKOT-S) 8.6-50 MG tablet Take 1 tablet by mouth 2 (two) times daily. Patient taking differently: Take 1 tablet by mouth at bedtime.  10/19/16  Yes Patrecia Pour, Christean Grief, MD  simvastatin (ZOCOR) 40 MG tablet Take 20 mg by mouth daily.   Yes [provider]  SYNTHROID 100 MCG tablet Take 100 mcg  by mouth daily before breakfast.  07/12/17  Yes [provider]  torsemide (DEMADEX) 20 MG tablet TAKE 2 TABLETS (40 MG TOTAL) BY MOUTH 2 (TWO) TIMES DAILY. 05/17/18 06/16/18 Yes Minus Breeding, MD  traZODone (DESYREL) 50 MG tablet Take 100 mg by mouth at bedtime.    Yes [provider]  Turmeric 500 MG TABS Take 500 mg by mouth daily.    Yes [provider]  venlafaxine XR (EFFEXOR-XR) 75 MG 24 hr capsule Take 75 mg by mouth daily.   Yes [provider]  metolazone (ZAROXOLYN) 2.5 MG tablet Take 1 tablet (2.5 mg total) by mouth daily as needed. Take 76min before  Torsemide Patient not taking: Reported on 06/10/2018 02/28/18 06/10/26  Minus Breeding, MD    Current Facility-Administered Medications  Medication Dose Route Frequency Provider Last Rate Last Dose  . acetaminophen (TYLENOL) tablet 650 mg  650 mg Oral Q6H PRN Ivor Costa, MD   650 mg at 06/10/18 1962   Or  . acetaminophen (TYLENOL) suppository 650 mg  650 mg Rectal Q6H PRN Ivor Costa, MD      . allopurinol (ZYLOPRIM) tablet 100 mg  100 mg Oral BID Ivor Costa, MD   100 mg at 06/10/18 1047  . calcium carbonate (OS-CAL - dosed in mg of elemental calcium) tablet 500 mg of elemental calcium  1 tablet Oral Q breakfast Ivor Costa, MD   500 mg of elemental calcium at 06/10/18 1047  . ferrous sulfate tablet 325 mg  325 mg Oral Daily Ivor Costa, MD   325 mg at 06/10/18 1047  . fluticasone (FLONASE) 50 MCG/ACT nasal spray 1 spray  1 spray Each Nare Daily Ivor Costa, MD   1 spray at 06/10/18 1052  . hydrALAZINE (APRESOLINE) injection 5 mg  5 mg Intravenous Q2H PRN Ivor Costa, MD      . hydrOXYzine (ATARAX/VISTARIL) tablet 10 mg  10 mg Oral TID PRN Ivor Costa, MD      . lactobacillus acidophilus & bulgar (LACTINEX) chewable tablet 1 tablet  1 tablet Oral Daily Ivor Costa, MD      . levothyroxine (SYNTHROID, LEVOTHROID) tablet 100 mcg  100 mcg Oral Q0600 Ivor Costa, MD   100 mcg at 06/10/18 0615  . loratadine (CLARITIN) tablet 10 mg  10 mg Oral Daily Ivor Costa, MD   10 mg at 06/10/18 1049  . magnesium oxide (MAG-OX) tablet 400 mg  400 mg Oral Daily Ivor Costa, MD   400 mg at 06/10/18 1049  . multivitamin with minerals tablet 1 tablet  1 tablet Oral Daily Ivor Costa, MD   1 tablet at 06/10/18 1049  . pantoprazole (PROTONIX) EC tablet 40 mg  40 mg Oral BID Ivor Costa, MD   40 mg at 06/10/18 1049  . senna-docusate (Senokot-S) tablet 1 tablet  1 tablet Oral QHS Ivor Costa, MD      . simvastatin (ZOCOR) tablet 20 mg  20 mg Oral Daily Ivor Costa, MD   20 mg at 06/10/18 1048  . traZODone (DESYREL) tablet 100 mg   100 mg Oral QHS Ivor Costa, MD      . venlafaxine XR (EFFEXOR-XR) 24 hr capsule 75 mg  75 mg Oral Daily Ivor Costa, MD   75 mg at 06/10/18 1049  . vitamin B-12 (CYANOCOBALAMIN) tablet 5,000 mcg  5,000 mcg Oral Daily Ivor Costa, MD   5,000 mcg at 06/10/18 1051  . zolpidem (AMBIEN) tablet 5 mg  5 mg Oral QHS PRN Ivor Costa, MD  Allergies as of 06/09/2018  . (No Known Allergies)    Family History  Problem Relation Age of Onset  . Breast cancer Mother   . Colon cancer Father   . Prostate cancer Father   . Emphysema Father     Social History   Socioeconomic History  . Marital status: Widowed    Spouse name: Not on file  . Number of children: Not on file  . Years of education: Not on file  . Highest education level: Not on file  Occupational History  . Not on file  Social Needs  . Financial resource strain: Not on file  . Food insecurity:    Worry: Not on file    Inability: Not on file  . Transportation needs:    Medical: Not on file    Non-medical: Not on file  Tobacco Use  . Smoking status: Never Smoker  . Smokeless tobacco: Never Used  Substance and Sexual Activity  . Alcohol use: No  . Drug use: No  . Sexual activity: Not on file  Lifestyle  . Physical activity:    Days per week: Not on file    Minutes per session: Not on file  . Stress: Not on file  Relationships  . Social connections:    Talks on phone: Not on file    Gets together: Not on file    Attends religious service: Not on file    Active member of club or organization: Not on file    Attends meetings of clubs or organizations: Not on file    Relationship status: Not on file  . Intimate partner violence:    Fear of current or ex partner: Not on file    Emotionally abused: Not on file    Physically abused: Not on file    Forced sexual activity: Not on file  Other Topics Concern  . Not on file  Social History Narrative   Widowed   Lives with son and daughter-in-law   [name] here from  [place] 03/25/2016   2 sons   Occupation: retired, was a Museum/gallery conservator with [employer]    Review of Systems: As per HPI, all others negative  Physical Exam: Vital signs in last 24 hours: Temp:  [97.8 F (36.6 C)-99.1 F (37.3 C)] 98.7 F (37.1 C) (12/27 1035) Pulse Rate:  [114-135] 135 (12/27 1035) Resp:  [14-25] 25 (12/27 1140) BP: (79-105)/(59-78) 88/62 (12/27 1140) SpO2:  [97 %-100 %] 98 % (12/27 0747) Weight:  [61.2 kg-66.4 kg] 66.4 kg (12/27 0500) Last BM Date: 06/09/18 General:   Alert, elderly, somewhat frail-appearing, cooperative in NAD Head:  Normocephalic and atraumatic. Eyes:  Sclera clear, no icterus.   Conjunctiva pink. Ears:  Normal auditory acuity. Nose:  No deformity, discharge,  or lesions. Mouth:  No deformity or lesions.  Oropharynx pink & moist. Neck:  Supple; no masses or thyromegaly. Lungs:  Clear throughout to auscultation.   No wheezes, crackles, or rhonchi. No acute distress. Heart:  Tachycardic, irregular, ;aortic stenosis, tachycardic and irregular; no clicks, rubs,  or gallops. Abdomen:  Soft, nontender and nondistended. No masses, hepatosplenomegaly or hernias noted. Normal bowel sounds, without guarding, and without rebound.     Msk:  Symmetrical without gross deformities. Normal posture. Pulses:  Normal pulses noted. Extremities:  Without clubbing or edema. Neurologic:  Alert and  oriented x4;  grossly normal neurologically. Skin:  Thin and fragile, Intact without significant lesions or rashes. Psych:  Alert and cooperative. Normal mood and affect.  Lab Results: Recent Labs    06/10/18 0203 06/10/18 0734 06/10/18 1411  WBC 7.8 6.9 6.5  HGB 6.8* 8.0* 9.4*  HCT 23.2* 25.7* 29.4*  PLT 202 200 193   BMET Recent Labs    06/09/18 2238 06/10/18 0734  NA 140 141  K 4.1 3.9  CL 104 104  CO2 32 24  GLUCOSE 141* 111*  BUN 50* 47*  CREATININE 1.86* 1.68*  CALCIUM 8.3* 8.0*   LFT Recent Labs    06/09/18 2242  PROT 6.5   ALBUMIN 2.5*  AST 34  ALT 21  ALKPHOS 85  BILITOT 0.5  BILIDIR 0.2  IBILI 0.3   PT/INR No results for input(s): LABPROT, INR in the last 72 hours.  Studies/Results: Dg Chest 2 View  Result Date: 06/09/2018 CLINICAL DATA:  Recent syncopal episode with fall at home EXAM: CHEST - 2 VIEW COMPARISON:  None. FINDINGS: Cardiac shadow is mildly enlarged. Aortic calcifications are seen. Mild central vascular congestion is noted without interstitial edema. Small right-sided pleural effusion is noted laterally. No focal infiltrate is seen. Degenerative changes of the thoracic spine are noted. IMPRESSION: Mild central vascular congestion with small right-sided pleural effusion. Electronically Signed   By: Inez Catalina M.D.   On: 06/09/2018 23:26   Dg Lumbar Spine Complete  Result Date: 06/09/2018 CLINICAL DATA:  Recent syncopal episode with low back pain, initial encounter EXAM: LUMBAR SPINE - COMPLETE 4+ VIEW COMPARISON:  None. FINDINGS: Vertebral body height is well maintained. Mild osteophytic changes are seen. L5 is partially sacralized. Mild degenerative anterolisthesis of L4 on L5 is noted. No soft tissue abnormality is noted. Aortic calcifications are seen. IMPRESSION: Mild degenerative change without acute abnormality. Electronically Signed   By: Inez Catalina M.D.   On: 06/09/2018 23:50   Ct Head Wo Contrast  Result Date: 06/10/2018 CLINICAL DATA:  Status post syncope and fall. Confusion. Concern for head or cervical spine injury. Initial encounter. EXAM: CT HEAD WITHOUT CONTRAST CT CERVICAL SPINE WITHOUT CONTRAST TECHNIQUE: Multidetector CT imaging of the head and cervical spine was performed following the standard protocol without intravenous contrast. Multiplanar CT image reconstructions of the cervical spine were also generated. COMPARISON:  None. FINDINGS: CT HEAD FINDINGS Brain: No evidence of acute infarction, hemorrhage, hydrocephalus, extra-axial collection or mass lesion / mass  effect. Prominence of the ventricles and sulci reflects mild age-appropriate cortical volume loss. Mild periventricular and white matter change likely reflects small vessel ischemic microangiopathy. The brainstem and fourth ventricle are within normal limits. The basal ganglia are unremarkable in appearance. The cerebral hemispheres demonstrate grossly normal gray-white differentiation. No mass effect or midline shift is seen. Vascular: No hyperdense vessel or unexpected calcification. Skull: There is no evidence of fracture; visualized osseous structures are unremarkable in appearance. Sinuses/Orbits: The visualized portions of the orbits are within normal limits. The paranasal sinuses and mastoid air cells are well-aerated. Other: No significant soft tissue abnormalities are seen. CT CERVICAL SPINE FINDINGS Alignment: Normal. Skull base and vertebrae: No acute fracture. No primary bone lesion or focal pathologic process. Soft tissues and spinal canal: No prevertebral fluid or swelling. No visible canal hematoma. Disc levels: Intervertebral disc spaces are preserved. The bony foramina are grossly unremarkable. Underlying facet disease is noted. Upper chest: The visualized lung apices are clear. The thyroid gland is only partially characterized but appears grossly unremarkable. Scattered calcification is noted at the carotid bifurcations bilaterally. Other: No additional soft tissue abnormalities are seen. IMPRESSION: 1. No evidence of traumatic intracranial injury or  fracture. 2. No evidence of fracture or subluxation along the cervical spine. 3. Mild age-appropriate cortical volume loss and scattered small vessel ischemic microangiopathy. 4. Scattered calcification at the carotid bifurcations bilaterally. Carotid ultrasound would be helpful for further evaluation, when and as deemed clinically appropriate. Electronically Signed   By: Garald Balding M.D.   On: 06/10/2018 00:21   Ct Cervical Spine Wo  Contrast  Result Date: 06/10/2018 CLINICAL DATA:  Status post syncope and fall. Confusion. Concern for head or cervical spine injury. Initial encounter. EXAM: CT HEAD WITHOUT CONTRAST CT CERVICAL SPINE WITHOUT CONTRAST TECHNIQUE: Multidetector CT imaging of the head and cervical spine was performed following the standard protocol without intravenous contrast. Multiplanar CT image reconstructions of the cervical spine were also generated. COMPARISON:  None. FINDINGS: CT HEAD FINDINGS Brain: No evidence of acute infarction, hemorrhage, hydrocephalus, extra-axial collection or mass lesion / mass effect. Prominence of the ventricles and sulci reflects mild age-appropriate cortical volume loss. Mild periventricular and white matter change likely reflects small vessel ischemic microangiopathy. The brainstem and fourth ventricle are within normal limits. The basal ganglia are unremarkable in appearance. The cerebral hemispheres demonstrate grossly normal gray-white differentiation. No mass effect or midline shift is seen. Vascular: No hyperdense vessel or unexpected calcification. Skull: There is no evidence of fracture; visualized osseous structures are unremarkable in appearance. Sinuses/Orbits: The visualized portions of the orbits are within normal limits. The paranasal sinuses and mastoid air cells are well-aerated. Other: No significant soft tissue abnormalities are seen. CT CERVICAL SPINE FINDINGS Alignment: Normal. Skull base and vertebrae: No acute fracture. No primary bone lesion or focal pathologic process. Soft tissues and spinal canal: No prevertebral fluid or swelling. No visible canal hematoma. Disc levels: Intervertebral disc spaces are preserved. The bony foramina are grossly unremarkable. Underlying facet disease is noted. Upper chest: The visualized lung apices are clear. The thyroid gland is only partially characterized but appears grossly unremarkable. Scattered calcification is noted at the carotid  bifurcations bilaterally. Other: No additional soft tissue abnormalities are seen. IMPRESSION: 1. No evidence of traumatic intracranial injury or fracture. 2. No evidence of fracture or subluxation along the cervical spine. 3. Mild age-appropriate cortical volume loss and scattered small vessel ischemic microangiopathy. 4. Scattered calcification at the carotid bifurcations bilaterally. Carotid ultrasound would be helpful for further evaluation, when and as deemed clinically appropriate. Electronically Signed   By: Garald Balding M.D.   On: 06/10/2018 00:21   Impression:  1.  Acute on chronic anemia.  Ongoing for years, although Hgb is a bit lower now than baseline.   2.  Blood on tissue paper, chronic problem for years.  Suspect hemorrhoids versus perianal irritation. 3.  Atrial fibrillation with rapid ventricular response. 4.  Valvular heart disease (moderate aortic stenosis).  Plan:  1.  Patient very high risk for colonoscopy (age, moderate aortic stenosis, other comorbidities). 2.  Given her anemia and mild blood in stool are longstanding problems, and CT scan done last year for same reason (anemia, blood in stool) didn't show obvious colonic mass lesion, I feel colonoscopy would put her at high risk without appreciably doing much to help her overall situation. 3.  Patient in my opinion is very poor anticoagulation candidate given her recurrent falls (which per patient are not exclusively tied to cardiac arrhythmia), but will defer to primary team. 4.  Can consider OTC/Rx hemorrhoidal therapies as needed. 5.  Continue IV iron, and blood transfusions as needed. 6.  No Gi work-up or intervention is planned.  7.  [facility] will sign-off; thank you for the consultation.   LOS: 0 days   Josclyn Rosales M  06/10/2018, 3:46 PM  Cell [phone] If no answer or after 5 PM call [phone]

## 2018-06-10 NOTE — Progress Notes (Signed)
PT Cancellation Note  Patient Details Name: Madison Riggs MRN: [id number] DOB: 26-Jan-1928   Cancelled Treatment:    Reason Eval/Treat Not Completed: Medical issues which prohibited therapy. Noted critical Hgb value (6.8). Will discuss plan with RN and follow-up for PT evaluation as appropriate.  Mabeline Caras, PT, DPT Acute Rehabilitation Services  Pager [phone] Office [phone]  Derry Lory 06/10/2018, 7:41 AM

## 2018-06-10 NOTE — Plan of Care (Signed)
  Problem: Education: Goal: Knowledge of disease or condition will improve Outcome: Progressing   Problem: Activity: Goal: Ability to tolerate increased activity will improve Outcome: Progressing   Problem: Clinical Measurements: Goal: Respiratory complications will improve Outcome: Completed/Met

## 2018-06-10 NOTE — Progress Notes (Signed)
Patient seen and examined this morning, admitted earlier today, H&P reviewed and agree with assessment and plan  84 year old female with history of hypertension, hyperlipidemia, diet-controlled diabetes mellitus, hypothyroidism, chronic kidney disease stage IV, sleep apnea on CPAP, diastolic CHF who was admitted to the hospital after having a fall.  She has also been complaining of intermittent shortness of breath, and also reports intermittent rectal bleeding for "quite some time".  She was found to have new onset of A. fib with RVR with worsening anemia with a hemoglobin of 7.0, and worsening creatinine.  She was admitted to the hospitalist service   New onset A. fib with RVR -Cardiology consulted, appreciate input.  Soft blood pressure limits nodal agents, for now transfuse and see how she responds since anemia may very well be playing a role -With worsening hemoglobin and history of recurrent falls she is not a great candidate for anticoagulation -2D echo pending  Falls -PT to evaluate  Iron deficiency anemia/positive fecal occult -Discussed with Dr. Paulita Fujita, appreciate consultation, for now conservative management that she is a very poor candidate for aggressive interventions -For now watch, hold anticoagulation -Transfused 2 units of packed red blood cells  Hypothyroidism -TSH within normal limits at 2.5, continue levothyroxine  Acute kidney injury on chronic kidney disease -Creatinine 1.86 on admission, baseline around 1.4, improved this morning to 1.68.  Hypertension -For now hold antihypertensives as she is hypotensive  Type 2 diabetes mellitus, diet controlled -CBGs every morning  Chronic diastolic CHF -2D echo in 8786 showed grade 2 diastolic dysfunction with normal EF -Overall appears compensated, echo is pending  Hyperlipidemia -Continue home medications   Costin M. Cruzita Lederer, MD, PhD [facility] Pager [phone]  If 7PM-7AM, please contact  night-coverage [email or web address] Password [email or web address]

## 2018-06-10 NOTE — CV Procedure (Signed)
Attempted 2D Echo, heart rate too high, will try again at a later time.   06/10/18 Cardell Peach RDCS, RVT

## 2018-06-10 NOTE — Progress Notes (Addendum)
Progress Note  Patient Name: Madison Riggs Date of Encounter: 06/10/2018  Primary Cardiologist: Minus Breeding, MD   Subjective   Patient admitted overnight after suffering a fall and was found to be in atrial fibrillation with RVR and found to have a hemoglobin of 7.0. She is currently receiving her 2nd unit of blood. Patient denies any chest pain, shortness of breath, or palpitations this morning. Her only complaints is some lower back pain from where she fell.  Inpatient Medications    Scheduled Meds: . allopurinol  100 mg Oral BID  . calcium carbonate  1 tablet Oral Q breakfast  . ferrous sulfate  325 mg Oral Daily  . fluticasone  1 spray Each Nare Daily  . lactobacillus acidophilus & bulgar  1 tablet Oral Daily  . levothyroxine  100 mcg Oral Q0600  . loratadine  10 mg Oral Daily  . magnesium oxide  400 mg Oral Daily  . multivitamin with minerals  1 tablet Oral Daily  . pantoprazole  40 mg Oral BID  . senna-docusate  1 tablet Oral QHS  . simvastatin  20 mg Oral Daily  . torsemide  40 mg Oral BID  . traZODone  100 mg Oral QHS  . venlafaxine XR  75 mg Oral Daily  . vitamin B-12  5,000 mcg Oral Daily   Continuous Infusions: . sodium chloride     PRN Meds: acetaminophen **OR** acetaminophen, hydrALAZINE, hydrOXYzine, zolpidem   Vital Signs    Vitals:   06/10/18 0702 06/10/18 0747 06/10/18 0803 06/10/18 0818  BP: 91/70 (!) 81/65 (!) 79/62 (!) 88/61  Pulse: (!) 132 (!) 126 (!) 126 (!) 125  Resp: 16  (!) 21   Temp: 97.8 F (36.6 C) 97.8 F (36.6 C) 98.3 F (36.8 C) 98.3 F (36.8 C)  TempSrc: Oral Oral Oral Oral  SpO2: 98% 98%    Weight:      Height:        Intake/Output Summary (Last 24 hours) at 06/10/2018 0930 Last data filed at 06/10/2018 0803 Gross per 24 hour  Intake 1630 ml  Output -  Net 1630 ml   Filed Weights   06/09/18 2229 06/10/18 0500  Weight: 61.2 kg 66.4 kg    Telemetry    Atrial fibrillation with heart rates ranging between 110's  and 150's. Currently in the 130's to 140's. - Personally Reviewed  ECG    Atrial fibrillation with RVR (130 bpm) with nonspecific ST/T changes.  - Personally Reviewed  Physical Exam   GEN: Elderly frail female resting comfortably. Alert and in no acute distress.   Neck: Supple. Cardiac: Tachycardic with irregularly irregular rhythm. II/VI murmur best heard at right upper sternal border. No significant rubs or gallops appreciated. Respiratory: No increased work of breathing. Clear to auscultation bilaterally. No wheezes, rhonchi, or rales. GI: Abdomen soft, nontender, non-distended. Bowel sounds present. MS: 1+ pedal edema. SCDs currently in place on bilateral lower extremities. Neuro:  No focal deficits.  Psych: Normal affect.  Labs    Chemistry Recent Labs  Lab 06/09/18 2238 06/09/18 2242 06/10/18 0734  NA 140  --  141  K 4.1  --  3.9  CL 104  --  104  CO2 32  --  24  GLUCOSE 141*  --  111*  BUN 50*  --  47*  CREATININE 1.86*  --  1.68*  CALCIUM 8.3*  --  8.0*  PROT  --  6.5  --   ALBUMIN  --  2.5*  --   AST  --  34  --   ALT  --  21  --   ALKPHOS  --  85  --   BILITOT  --  0.5  --   GFRNONAA 24*  --  27*  GFRAA 28*  --  31*  ANIONGAP 4*  --  13     Hematology Recent Labs  Lab 06/09/18 2238 06/10/18 0013 06/10/18 0203 06/10/18 0734  WBC 7.9  --  7.8 6.9  RBC 2.40* 2.26* 2.30* 2.64*  HGB 7.0*  --  6.8* 8.0*  HCT 24.1*  --  23.2* 25.7*  MCV 100.4*  --  100.9* 97.3  MCH 29.2  --  29.6 30.3  MCHC 29.0*  --  29.3* 31.1  RDW 15.5  --  15.7* 15.6*  PLT 208  --  202 200    Cardiac Enzymes Recent Labs  Lab 06/10/18 0203 06/10/18 0734  TROPONINI <0.03 <0.03    Recent Labs  Lab 06/09/18 2244  TROPIPOC 0.01     BNP Recent Labs  Lab 06/10/18 0203  BNP 378.9*     DDimer No results for input(s): DDIMER in the last 168 hours.   Radiology    Dg Chest 2 View  Result Date: 06/09/2018 CLINICAL DATA:  Recent syncopal episode with fall at home EXAM:  CHEST - 2 VIEW COMPARISON:  None. FINDINGS: Cardiac shadow is mildly enlarged. Aortic calcifications are seen. Mild central vascular congestion is noted without interstitial edema. Small right-sided pleural effusion is noted laterally. No focal infiltrate is seen. Degenerative changes of the thoracic spine are noted. IMPRESSION: Mild central vascular congestion with small right-sided pleural effusion. Electronically Signed   By: Inez Catalina M.D.   On: 06/09/2018 23:26   Dg Lumbar Spine Complete  Result Date: 06/09/2018 CLINICAL DATA:  Recent syncopal episode with low back pain, initial encounter EXAM: LUMBAR SPINE - COMPLETE 4+ VIEW COMPARISON:  None. FINDINGS: Vertebral body height is well maintained. Mild osteophytic changes are seen. L5 is partially sacralized. Mild degenerative anterolisthesis of L4 on L5 is noted. No soft tissue abnormality is noted. Aortic calcifications are seen. IMPRESSION: Mild degenerative change without acute abnormality. Electronically Signed   By: Inez Catalina M.D.   On: 06/09/2018 23:50   Ct Head Wo Contrast  Result Date: 06/10/2018 CLINICAL DATA:  Status post syncope and fall. Confusion. Concern for head or cervical spine injury. Initial encounter. EXAM: CT HEAD WITHOUT CONTRAST CT CERVICAL SPINE WITHOUT CONTRAST TECHNIQUE: Multidetector CT imaging of the head and cervical spine was performed following the standard protocol without intravenous contrast. Multiplanar CT image reconstructions of the cervical spine were also generated. COMPARISON:  None. FINDINGS: CT HEAD FINDINGS Brain: No evidence of acute infarction, hemorrhage, hydrocephalus, extra-axial collection or mass lesion / mass effect. Prominence of the ventricles and sulci reflects mild age-appropriate cortical volume loss. Mild periventricular and white matter change likely reflects small vessel ischemic microangiopathy. The brainstem and fourth ventricle are within normal limits. The basal ganglia are  unremarkable in appearance. The cerebral hemispheres demonstrate grossly normal gray-white differentiation. No mass effect or midline shift is seen. Vascular: No hyperdense vessel or unexpected calcification. Skull: There is no evidence of fracture; visualized osseous structures are unremarkable in appearance. Sinuses/Orbits: The visualized portions of the orbits are within normal limits. The paranasal sinuses and mastoid air cells are well-aerated. Other: No significant soft tissue abnormalities are seen. CT CERVICAL SPINE FINDINGS Alignment: Normal. Skull base and vertebrae: No acute fracture.  No primary bone lesion or focal pathologic process. Soft tissues and spinal canal: No prevertebral fluid or swelling. No visible canal hematoma. Disc levels: Intervertebral disc spaces are preserved. The bony foramina are grossly unremarkable. Underlying facet disease is noted. Upper chest: The visualized lung apices are clear. The thyroid gland is only partially characterized but appears grossly unremarkable. Scattered calcification is noted at the carotid bifurcations bilaterally. Other: No additional soft tissue abnormalities are seen. IMPRESSION: 1. No evidence of traumatic intracranial injury or fracture. 2. No evidence of fracture or subluxation along the cervical spine. 3. Mild age-appropriate cortical volume loss and scattered small vessel ischemic microangiopathy. 4. Scattered calcification at the carotid bifurcations bilaterally. Carotid ultrasound would be helpful for further evaluation, when and as deemed clinically appropriate. Electronically Signed   By: Garald Balding M.D.   On: 06/10/2018 00:21   Ct Cervical Spine Wo Contrast  Result Date: 06/10/2018 CLINICAL DATA:  Status post syncope and fall. Confusion. Concern for head or cervical spine injury. Initial encounter. EXAM: CT HEAD WITHOUT CONTRAST CT CERVICAL SPINE WITHOUT CONTRAST TECHNIQUE: Multidetector CT imaging of the head and cervical spine was  performed following the standard protocol without intravenous contrast. Multiplanar CT image reconstructions of the cervical spine were also generated. COMPARISON:  None. FINDINGS: CT HEAD FINDINGS Brain: No evidence of acute infarction, hemorrhage, hydrocephalus, extra-axial collection or mass lesion / mass effect. Prominence of the ventricles and sulci reflects mild age-appropriate cortical volume loss. Mild periventricular and white matter change likely reflects small vessel ischemic microangiopathy. The brainstem and fourth ventricle are within normal limits. The basal ganglia are unremarkable in appearance. The cerebral hemispheres demonstrate grossly normal gray-white differentiation. No mass effect or midline shift is seen. Vascular: No hyperdense vessel or unexpected calcification. Skull: There is no evidence of fracture; visualized osseous structures are unremarkable in appearance. Sinuses/Orbits: The visualized portions of the orbits are within normal limits. The paranasal sinuses and mastoid air cells are well-aerated. Other: No significant soft tissue abnormalities are seen. CT CERVICAL SPINE FINDINGS Alignment: Normal. Skull base and vertebrae: No acute fracture. No primary bone lesion or focal pathologic process. Soft tissues and spinal canal: No prevertebral fluid or swelling. No visible canal hematoma. Disc levels: Intervertebral disc spaces are preserved. The bony foramina are grossly unremarkable. Underlying facet disease is noted. Upper chest: The visualized lung apices are clear. The thyroid gland is only partially characterized but appears grossly unremarkable. Scattered calcification is noted at the carotid bifurcations bilaterally. Other: No additional soft tissue abnormalities are seen. IMPRESSION: 1. No evidence of traumatic intracranial injury or fracture. 2. No evidence of fracture or subluxation along the cervical spine. 3. Mild age-appropriate cortical volume loss and scattered small  vessel ischemic microangiopathy. 4. Scattered calcification at the carotid bifurcations bilaterally. Carotid ultrasound would be helpful for further evaluation, when and as deemed clinically appropriate. Electronically Signed   By: Garald Balding M.D.   On: 06/10/2018 00:21    Cardiac Studies   Echo pending.  Patient Profile   Ms. Vigil is a 84 y.o. female with a history of diastolic congestive heart failure, aortic stenosis, hypertension, hyperlipidemia, diet-controlled diabetes, obstructive sleep apnea on CPAP, GERD, hypothyroidism, and CKD stage IV who presented to the [facility] ED on 06/09/2018 after suffering a fall. Cardiology was consulted for evaluation of atrial fibrillation with RVR.   Assessment & Plan    New Onset Atrial Fibrillation with RVR - Patient present to the ED after falling and was found to be in atrial  fibrillation with RVR. Also found to have a hemoglobin of 7. - Telemetry shows continued atrial fibrillation with heart rates currently in the 120's to 130's. Rate has improved some since patient has received blood. - AV nodal agents have not been initiated due to patient's hypotension. Most recent BP 88/61. - Anticoagulation not initiated due to patient's hemoglobin. Do not know if patient will be a candidate for long term anticoagulation either due to frequent falls and possible chronic GI bleed. - Suspects patient's severe anemia is contributing to elevated heart rate at this time. - Echo has been ordered. - Could consider starting antiarrythmic such as Amiodarone but there is a risk of stroke if the patient converts given we do not know the duration of atrial fibrillation. Discussed with MD - will hold off on Amiodarone. Consider transfusing another unit of blood.  Iron Deficiency Anemia - Patient does have chronic anemia with baseline hemoglobin appearing to be in the 9 to 10 range. However, patient presented to the ED with hemoglobin of 7, which then dropped to  6.8. - Patient may have slow, chronic GI bleed. She reports dark blood on toilet paper after having bowel movements. - Patient is currently receiving her 2nd unit of blood. Consider transfusing another unit to help with atrial fibrillation as above. - Per primary team and GI.  Chronic Diastolic CHF - Most recent Echo from 10/2016 showed LVEF of 60-65% with hypokinesis of the mid-apicalanteroseptal myocardium and grade 2 diastolic dysfunction. - Patient has lower extremity edema but otherwise does not appear significantly volume overloaded. - Will hold Torsemide at this time due to hypotension. - Echo pending. - Will continue to monitor volume status.   For questions or updates, please contact [place] Please consult [email or web address] for contact info under        Signed, Darreld Mclean, PA-C  06/10/2018, 9:30 AM    Attending Note:   The patient was seen and examined.  Agree with assessment and plan as noted above.  Changes made to the above note as needed.  Patient seen and independently examined with  Sande Rives, PA .   We discussed all aspects of the encounter. I agree with the assessment and plan as stated above.  1.    New onset atrial fibrillation with rapid ventricular response: The patient was found to have new onset atrial fibrillation. She continues to have a rapid rate but this is gradual improving with a transfusion of packed red blood cells. Do not know the duration of atrial fibrillation and would like to see if we can get her rate controlled without using amiodarone but this may be our only option. Present her blood pressures too low to consider IV Cardizem drip. Get an echocardiogram for further assessment of her left ventricular function. Consider calcium drip or amiodarone drip control.  Discussed with her the risk of starting an amiodarone drip and that that could lead to a stroke.  She is a very poor candidate for long-term anticoagulation.  She has  significant anemia and also falls very frequently.  2.  Anemia: She states that she has dark maroon stool quite frequently.  She takes iron tablets so it is unclear if this is due to blood or not.  She needs to have her stools guaiaced. Agree with transfusion to help with her anemia and also rate control.     I have spent a total of 40 minutes with patient reviewing hospital  notes , telemetry, EKGs, labs and examining  patient as well as establishing an assessment and plan that was discussed with the patient. > 50% of time was spent in direct patient care.    Thayer Headings, Brooke Bonito., MD, [facility] 06/10/2018, 10:22 AM [address],  [place] Pager [phone]

## 2018-06-10 NOTE — Plan of Care (Signed)
  Problem: Education: Goal: Knowledge of disease or condition will improve Outcome: Progressing   Problem: Activity: Goal: Ability to tolerate increased activity will improve Outcome: Progressing   Problem: Clinical Measurements: Goal: Respiratory complications will improve Outcome: Progressing   

## 2018-06-10 NOTE — Progress Notes (Signed)
OT Cancellation Note  Patient Details Name: Madison Riggs MRN: [id number] DOB: 15-Sep-1927   Cancelled Treatment:    Reason Eval/Treat Not Completed: Patient not medically ready(Pt's HR in afib from 120-140s at rest lying in bed. Hold.)   Ebony Hail Harold Hedge) Marsa Aris OTR/L Acute Rehabilitation Services Pager: [phone] Office: [phone]  Fredda Hammed 06/10/2018, 1:26 PM

## 2018-06-10 NOTE — H&P (Signed)
History and Physical    Madison Riggs KDX:[id number] DOB: 04/27/1928 DOA: 06/09/2018  Referring MD/NP/PA:   PCP: Rolene Course, PA-C   Patient coming from:  The patient is coming from home.  At baseline, pt is independent for most of ADL.        Chief Complaint: fall  HPI: Madison Riggs is a 84 y.o. female with medical history significant of hypertension, hyperlipidemia, diet-controlled diabetes, GERD, hypothyroidism, gout, aortic stenosis, CKD 4, OSA on CPAP, iron deficiency anemia, mild dementia, dCHF, gastric bypass surgery, presents with fall.  Per patient's son, he heard a sound from pt's fall and found pt on the floor at about 9:30 AM. Pt was confused for a few seconds, then become oriented x3.  Initially ED physician thought pt could have a syncope, but her son strongly denies that had loss consciousness.  Patient denies any unilateral weakness, numbness or tingling his extremities.  No facial droop or slurred speech.  No vision change or hearing loss.  Patient denies any chest pain or cough.  She has some mild intermittent shortness of breath.  Per patient's son, patient had mild rectal bleeding in the past which he attributes to possible hemorrhoid.  Patient denies recent rectal bleeding or dark stool.  No nausea vomiting, diarrhea or abdominal pain.  No symptoms of UTI.  When I saw patient in the ED, patient is alert, oriented x3.  She complains of mild lower back pain. Per patient's son, patient had negative colonoscopy many years ago.  She also had EGD long time ago which was likely negative except for acid reflux issue per his son.   ED Course: pt was found to have new onset A. fib with RVR with heart rate up to 138, positive FOBT, hemoglobin dropped from 9.9 on 03/14/18 to 7.0, WBC 7.9, worsening renal function, soft blood pressure, no tachypnea, oxygen saturation 97% on room air.  CT head is negative for acute intracranial abnormalities.  CT of C-spine is negative for traumatic  fracture.  X-ray of lumbar spine is  negative for fracture, showed degenerative disc disease.  Patient is placed on stepdown bed for observation.  Cardiology, Dr. Massie Bougie was consulted by EDP.  Review of Systems:   General: no fevers, chills, no body weight gain, has fatigue HEENT: no blurry vision, hearing changes or sore throat Respiratory: No dyspnea, coughing, wheezing CV: no chest pain, no palpitations GI: no nausea, vomiting, abdominal pain, diarrhea, constipation GU: no dysuria, burning on urination, increased urinary frequency, hematuria  Ext: Has leg edema Neuro: no unilateral weakness, numbness, or tingling, no vision change or hearing loss.  Has fall Skin: no rash, no skin tear. MSK: No muscle spasm, no deformity, no limitation of range of movement in spin.  Has lower back pain Heme: No easy bruising.  Travel history: No recent long distant travel.  Allergy: No Known Allergies  Past Medical History:  Diagnosis Date  . Aortic stenosis    moderate  . CKD (chronic kidney disease) stage 3, GFR 30-59 ml/min (HCC)   . Depression   . DM (diabetes mellitus), type 2 ([place])   . Gout   . HTN (hypertension)   . Hypercholesterolemia   . Hypothyroidism   . Iron deficiency anemia   . Sleep apnea    CPAP  . Syncope 06/10/2018    Past Surgical History:  Procedure Laterality Date  . BACK SURGERY  1984  . CATARACT EXTRACTION, BILATERAL  1997  . CHOLECYSTECTOMY  2001  .  ESOPHAGOGASTRODUODENOSCOPY N/A 10/18/2016   Procedure: ESOPHAGOGASTRODUODENOSCOPY (EGD);  Surgeon: Teena Irani, MD;  Location: [facility];  Service: Endoscopy;  Laterality: N/A;  . GASTRIC BYPASS  1999  . IR PARACENTESIS  02/09/2017  . KNEE SURGERY Left 1994  . THORACENTESIS Right    in [place], [place]  . TONSILLECTOMY      Social History:  reports that she has never smoked. She has never used smokeless tobacco. She reports that she does not drink alcohol or use drugs.  Family History:  Family History    Problem Relation Age of Onset  . Breast cancer Mother   . Colon cancer Father   . Prostate cancer Father   . Emphysema Father      Prior to Admission medications   Medication Sig Start Date End Date Taking? Authorizing Provider  acetaminophen (TYLENOL) 500 MG tablet Take 500 mg by mouth every 6 (six) hours as needed for moderate pain.    [provider]  allopurinol (ZYLOPRIM) 100 MG tablet Take 100 mg by mouth 2 (two) times daily.     [provider]  b complex vitamins tablet Take 1 tablet by mouth daily.    [provider]  calcium carbonate (OS-CAL) 600 MG TABS tablet Take 600 mg by mouth daily.    [provider]  cetirizine (ZYRTEC) 10 MG tablet Take 10 mg by mouth at bedtime.     [provider]  Cyanocobalamin (VITAMIN B-12) 5000 MCG SUBL Place 5,000 mcg under the tongue daily.    [provider]  fluticasone (FLONASE) 50 MCG/ACT nasal spray Place 1 spray into both nostrils daily as needed. 11/18/16   [provider]  folic acid (FOLVITE) 1 MG tablet Take 1 mg by mouth every morning.     [provider]  KLOR-CON M20 20 MEQ tablet TAKE 1 TABLET BY MOUTH TWICE A DAY 06/03/18   Minus Breeding, MD  Lactobacillus (ACIDOPHILUS) 100 MG CAPS Take 100 mg by mouth every morning.     [provider]  Magnesium 400 MG TABS Take 1 tablet by mouth daily.    [provider]  metolazone (ZAROXOLYN) 2.5 MG tablet Take 1 tablet (2.5 mg total) by mouth daily as needed. Take 75min before Torsemide 02/28/18 05/29/18  Minus Breeding, MD  Multiple Vitamins-Minerals (WOMENS MULTIVITAMIN PO) Take 1 tablet by mouth daily.    [provider]  pantoprazole (PROTONIX) 40 MG tablet Take 1 tablet (40 mg total) by mouth 2 (two) times daily. 10/19/16   Doreatha Lew, MD  senna-docusate (SENOKOT-S) 8.6-50 MG tablet Take 1 tablet by mouth 2 (two) times daily. Patient taking differently: Take 2 tablets by mouth at  bedtime.  10/19/16   Doreatha Lew, MD  simvastatin (ZOCOR) 40 MG tablet Take 20 mg by mouth daily.    [provider]  SYNTHROID 100 MCG tablet Take 100 mcg by mouth daily before breakfast.  07/12/17   [provider]  torsemide (DEMADEX) 20 MG tablet TAKE 2 TABLETS (40 MG TOTAL) BY MOUTH 2 (TWO) TIMES DAILY. 05/17/18 06/16/18  Minus Breeding, MD  traZODone (DESYREL) 50 MG tablet Take 100 mg by mouth at bedtime.     [provider]  triamcinolone cream (KENALOG) 0.1 % Apply 1 application topically 2 (two) times daily. 03/23/17   [provider]  Turmeric 500 MG TABS Take 500 mg by mouth every morning.     [provider]  venlafaxine (EFFEXOR) 75 MG tablet Take 75 mg  by mouth daily.     [provider]    Physical Exam: Vitals:   06/09/18 2229 06/09/18 2230 06/10/18 0030 06/10/18 0100  BP:  103/71 104/69 105/78  Pulse:  (!) 127 (!) 128 (!) 114  Resp:  19 18 16   Temp:      TempSrc:      SpO2:  99% 99% 98%  Weight: 61.2 kg     Height: 5\' 2"  (1.575 m)      General: Not in acute distress HEENT:       Eyes: PERRL, EOMI, no scleral icterus.       ENT: No discharge from the ears and nose, no pharynx injection, no tonsillar enlargement.        Neck: No JVD, no bruit, no mass felt. Heme: No neck lymph node enlargement. Cardiac: S1/S2, RRR, has 2/6 systolic murmurs, No gallops or rubs. Respiratory: No rales, wheezing, rhonchi or rubs. GI: Soft, nondistended, nontender, no rebound pain, no organomegaly, BS present. GU: No hematuria Ext: Has 1+ pitting leg edema bilaterally.  Has venous insufficiency change in both lower legs.  2+DP/PT pulse bilaterally. Musculoskeletal: No joint deformities, No joint redness or warmth, no limitation of ROM in spin. Skin: No rashes.  Neuro: Alert, oriented X3, cranial nerves II-XII grossly intact, moves all extremities normally.  Psych: Patient is not psychotic, no suicidal or hemocidal ideation.  Labs  on Admission: I have personally reviewed following labs and imaging studies  CBC: Recent Labs  Lab 06/09/18 2238  WBC 7.9  HGB 7.0*  HCT 24.1*  MCV 100.4*  PLT 539   Basic Metabolic Panel: Recent Labs  Lab 06/09/18 2238  NA 140  K 4.1  CL 104  CO2 32  GLUCOSE 141*  BUN 50*  CREATININE 1.86*  CALCIUM 8.3*   GFR: Estimated Creatinine Clearance: 18.3 mL/min (A) (by C-G formula based on SCr of 1.86 mg/dL (H)). Liver Function Tests: Recent Labs  Lab 06/09/18 2242  AST 34  ALT 21  ALKPHOS 85  BILITOT 0.5  PROT 6.5  ALBUMIN 2.5*   No results for input(s): LIPASE, AMYLASE in the last 168 hours. No results for input(s): AMMONIA in the last 168 hours. Coagulation Profile: No results for input(s): INR, PROTIME in the last 168 hours. Cardiac Enzymes: No results for input(s): CKTOTAL, CKMB, CKMBINDEX, TROPONINI in the last 168 hours. BNP (last 3 results) No results for input(s): PROBNP in the last 8760 hours. HbA1C: No results for input(s): HGBA1C in the last 72 hours. CBG: Recent Labs  Lab 06/09/18 2227  GLUCAP 137*   Lipid Profile: No results for input(s): CHOL, HDL, LDLCALC, TRIG, CHOLHDL, LDLDIRECT in the last 72 hours. Thyroid Function Tests: No results for input(s): TSH, T4TOTAL, FREET4, T3FREE, THYROIDAB in the last 72 hours. Anemia Panel: Recent Labs    06/10/18 0013  FOLATE 13.9  RETICCTPCT 3.0   Urine analysis: No results found for: COLORURINE, APPEARANCEUR, LABSPEC, PHURINE, GLUCOSEU, HGBUR, BILIRUBINUR, KETONESUR, PROTEINUR, UROBILINOGEN, NITRITE, LEUKOCYTESUR Sepsis Labs: @LABRCNTIP (procalcitonin:4,lacticidven:4) )No results found for this or any previous visit (from the past 240 hour(s)).   Radiological Exams on Admission: Dg Chest 2 View  Result Date: 06/09/2018 CLINICAL DATA:  Recent syncopal episode with fall at home EXAM: CHEST - 2 VIEW COMPARISON:  None. FINDINGS: Cardiac shadow is mildly enlarged. Aortic calcifications are seen. Mild  central vascular congestion is noted without interstitial edema. Small right-sided pleural effusion is noted laterally. No focal infiltrate is seen. Degenerative changes of the thoracic spine are  noted. IMPRESSION: Mild central vascular congestion with small right-sided pleural effusion. Electronically Signed   By: Inez Catalina M.D.   On: 06/09/2018 23:26   Dg Lumbar Spine Complete  Result Date: 06/09/2018 CLINICAL DATA:  Recent syncopal episode with low back pain, initial encounter EXAM: LUMBAR SPINE - COMPLETE 4+ VIEW COMPARISON:  None. FINDINGS: Vertebral body height is well maintained. Mild osteophytic changes are seen. L5 is partially sacralized. Mild degenerative anterolisthesis of L4 on L5 is noted. No soft tissue abnormality is noted. Aortic calcifications are seen. IMPRESSION: Mild degenerative change without acute abnormality. Electronically Signed   By: Inez Catalina M.D.   On: 06/09/2018 23:50   Ct Head Wo Contrast  Result Date: 06/10/2018 CLINICAL DATA:  Status post syncope and fall. Confusion. Concern for head or cervical spine injury. Initial encounter. EXAM: CT HEAD WITHOUT CONTRAST CT CERVICAL SPINE WITHOUT CONTRAST TECHNIQUE: Multidetector CT imaging of the head and cervical spine was performed following the standard protocol without intravenous contrast. Multiplanar CT image reconstructions of the cervical spine were also generated. COMPARISON:  None. FINDINGS: CT HEAD FINDINGS Brain: No evidence of acute infarction, hemorrhage, hydrocephalus, extra-axial collection or mass lesion / mass effect. Prominence of the ventricles and sulci reflects mild age-appropriate cortical volume loss. Mild periventricular and white matter change likely reflects small vessel ischemic microangiopathy. The brainstem and fourth ventricle are within normal limits. The basal ganglia are unremarkable in appearance. The cerebral hemispheres demonstrate grossly normal gray-white differentiation. No mass effect or  midline shift is seen. Vascular: No hyperdense vessel or unexpected calcification. Skull: There is no evidence of fracture; visualized osseous structures are unremarkable in appearance. Sinuses/Orbits: The visualized portions of the orbits are within normal limits. The paranasal sinuses and mastoid air cells are well-aerated. Other: No significant soft tissue abnormalities are seen. CT CERVICAL SPINE FINDINGS Alignment: Normal. Skull base and vertebrae: No acute fracture. No primary bone lesion or focal pathologic process. Soft tissues and spinal canal: No prevertebral fluid or swelling. No visible canal hematoma. Disc levels: Intervertebral disc spaces are preserved. The bony foramina are grossly unremarkable. Underlying facet disease is noted. Upper chest: The visualized lung apices are clear. The thyroid gland is only partially characterized but appears grossly unremarkable. Scattered calcification is noted at the carotid bifurcations bilaterally. Other: No additional soft tissue abnormalities are seen. IMPRESSION: 1. No evidence of traumatic intracranial injury or fracture. 2. No evidence of fracture or subluxation along the cervical spine. 3. Mild age-appropriate cortical volume loss and scattered small vessel ischemic microangiopathy. 4. Scattered calcification at the carotid bifurcations bilaterally. Carotid ultrasound would be helpful for further evaluation, when and as deemed clinically appropriate. Electronically Signed   By: Garald Balding M.D.   On: 06/10/2018 00:21   Ct Cervical Spine Wo Contrast  Result Date: 06/10/2018 CLINICAL DATA:  Status post syncope and fall. Confusion. Concern for head or cervical spine injury. Initial encounter. EXAM: CT HEAD WITHOUT CONTRAST CT CERVICAL SPINE WITHOUT CONTRAST TECHNIQUE: Multidetector CT imaging of the head and cervical spine was performed following the standard protocol without intravenous contrast. Multiplanar CT image reconstructions of the cervical  spine were also generated. COMPARISON:  None. FINDINGS: CT HEAD FINDINGS Brain: No evidence of acute infarction, hemorrhage, hydrocephalus, extra-axial collection or mass lesion / mass effect. Prominence of the ventricles and sulci reflects mild age-appropriate cortical volume loss. Mild periventricular and white matter change likely reflects small vessel ischemic microangiopathy. The brainstem and fourth ventricle are within normal limits. The basal ganglia are unremarkable in  appearance. The cerebral hemispheres demonstrate grossly normal gray-white differentiation. No mass effect or midline shift is seen. Vascular: No hyperdense vessel or unexpected calcification. Skull: There is no evidence of fracture; visualized osseous structures are unremarkable in appearance. Sinuses/Orbits: The visualized portions of the orbits are within normal limits. The paranasal sinuses and mastoid air cells are well-aerated. Other: No significant soft tissue abnormalities are seen. CT CERVICAL SPINE FINDINGS Alignment: Normal. Skull base and vertebrae: No acute fracture. No primary bone lesion or focal pathologic process. Soft tissues and spinal canal: No prevertebral fluid or swelling. No visible canal hematoma. Disc levels: Intervertebral disc spaces are preserved. The bony foramina are grossly unremarkable. Underlying facet disease is noted. Upper chest: The visualized lung apices are clear. The thyroid gland is only partially characterized but appears grossly unremarkable. Scattered calcification is noted at the carotid bifurcations bilaterally. Other: No additional soft tissue abnormalities are seen. IMPRESSION: 1. No evidence of traumatic intracranial injury or fracture. 2. No evidence of fracture or subluxation along the cervical spine. 3. Mild age-appropriate cortical volume loss and scattered small vessel ischemic microangiopathy. 4. Scattered calcification at the carotid bifurcations bilaterally. Carotid ultrasound would be  helpful for further evaluation, when and as deemed clinically appropriate. Electronically Signed   By: Garald Balding M.D.   On: 06/10/2018 00:21     EKG: Independently reviewed.  Atrial fibrillation, QTc 520, low voltage, T wave inversion in inferior leads and precordial leads.  Assessment/Plan Principal Problem:   Atrial fibrillation with RVR (HCC) Active Problems:   Iron deficiency anemia   Hypertension   Type II diabetes mellitus with renal manifestations (HCC)   Chronic diastolic HF (heart failure) (HCC)   CKD (chronic kidney disease), stage IV (HCC)   New onset atrial fibrillation (HCC)   HLD (hyperlipidemia)   GERD (gastroesophageal reflux disease)   Hypothyroidism   Fall   Positive occult stool blood test   New onset atrial fibrillation with RVR ([place]): pt is found to have a new onset atrial fibrillation with RVR. Heart rate was initially 130s-->110s now.  Denies any chest pain.  Cardiology, Dr. Massie Bougie was consulted by EDP. CHA2DS2-VASc Score is 6, needs oral anticoagulation, but pt has worsening anemia and a positive FOBT.  Cannot start anticoagulants or aspirin.  Currently heart rate 130s-->110s  - will place on Tele bed for obs - check trop x 3 - 2d echo - may start IV Cardizem drip if heart rate is not controlled. - f/u cardiac recommendations.  Fall: CT of head and neck is negative.  Patient son strongly denies patient had loss consciousness, therefore not syncope. -PT/OT  Iron deficiency anemia and positive FOBT: Patient may have slow GI blood loss. Dr.  Marland KitchenChange Protonix to 40 mg twice daily -CBC every 6 hours -Transfuse 2 unit blood -Continue iron supplement and Vb12 -Anemia panel -May need to discuses with GIB in AM (Dr. Paulita Fujita saw pt in the past) -keep pt NPO in case pt has worsening GIB and needs procedure  HTN:  -pt is on torsemide which is for CHF -IV hydralazine prn  Diet controled Type II diabetes mellitus with renal manifestations ([place]): no A1c  on record. Blood sugar is 141. -check CBG qAM  Chronic diastolic HF (heart failure) ([place]): 2D echo on 10/17/2016 showed EF 60-65% with grade 2 diastolic dysfunction.  Patient has 1+ leg edema, but no JVD.  No respiratory distress.  CHF seems to be compensated. -Continue home torsemide 40 mg twice daily -Check BNP  CKD (chronic  kidney disease), stage IV ([place]): Slightly worsening.  Baseline creatinine 1.3-1.5.  Her creatinine is 1.86, BUN 20. -Patient received 1 L normal saline bolus in ED.  HLD (hyperlipidemia): -zocor  GERD: -Protonix  Hypothyroidism:  -Continue home Synthroid   DVT ppx: SCD Code Status: Full code Family Communication:   Yes, patient's son  at bed side Disposition Plan:  Anticipate discharge back to previous home environment Consults called: Card, Dr. Massie Bougie Admission status:   SDU/obs  Date of Service 06/10/2018    Ivor Costa [facility] Pager [phone]  If 7PM-7AM, please contact night-coverage [email or web address] Password [facility] 06/10/2018, 2:24 AM

## 2018-06-10 NOTE — Consult Note (Signed)
CARDIOLOGY CONSULT NOTE   Referring Physician: Dr. Blaine Hamper Primary Physician:  Rolene Course, [place] - C  Primary Cardiologist: Dr. Percival Spanish Reason for Consultation: New Onset Afib with RVR  HPI:  Patient is an 84 y/o F with hx of chronic diastolic HF, mild dementia, Combined macrocytic, Fe deficiency anemia, Mild Aortic stenosis per echo from 10/2016, Moderate TR that is being managed medically, HTN, Hypothyroidism who comes to the hospital after a fall. It's in doubt if this were a syncopal event, but the son who was there after the fall states that he doesn't think she lost consciousness as she told him she fell and hit the back of her head. She also told him that her head has been "feeling funny" over the last few days, but this is a recurrent issue for here. Given this fall, he called EMS and brought here to the hospital. On arrival, she was found be in afib with RVR with a hb of 7. Given that her baseline BP is usually in the 100's she wasn't given any rate control medications for the afib and given the anemia/duration of afib being unknown cardioversion was not attempted. Patient appears to be relatively asymptomatic from an afib standpoint as she denies any palpitations, dizziness, shortness of breath, or chest pain.   Per the son, this is the best she has been until this fall tonight. She has been diuresing well on her current diuretic regimen of torsemide 40mg , BID. He added that she's been having many recurrent falls, all of which appear to be mechanical and is requiring more assistance at home with daily activities.   Patient added that she does have some blood in her stools on occasion and had an colonoscopy many years ago, but nothing recently. She did however have an EGD in 10/2016 which showed esophagitis with no evidence of bleeding that was to be treated with PPI.  Review of Systems:     Cardiac Review of Systems: {Y] = yes [ ]  = no  Chest Pain [    ]  Resting SOB [   ] Exertional  SOB  [  ]  Orthopnea [  ]   Pedal Edema [   ]    Palpitations [  ] Syncope  [  ]   Presyncope [   ]  General Review of Systems: [Y] = yes [  ]=no Constitional: recent weight change [  ]; anorexia [  ]; fatigue [  ]; nausea [  ]; night sweats [  ]; fever [  ]; or chills [  ];                                                                     Eyes : blurred vision [  ]; diplopia [   ]; vision changes [  ];  Amaurosis fugax[  ]; Resp: cough [  ];  wheezing[  ];  hemoptysis[  ];  PND [  ];  GI:  gallstones[  ], vomiting[  ];  dysphagia[  ]; melena[  ];  hematochezia [  ]; heartburn[  ];   GU: kidney stones [  ]; hematuria[  ];   dysuria [  ];  nocturia[  ]; incontinence [  ];  Skin: rash, swelling[  ];, hair loss[  ];  peripheral edema[  ];  or itching[  ]; Musculosketetal: myalgias[  ];  joint swelling[  ];  joint erythema[  ];  joint pain[  ];  back pain[ Y ];  Heme/Lymph: bruising[  ];  bleeding[  ];  anemia[  ];  Neuro: TIA[  ];  headaches[  ];  stroke[  ];  vertigo[  ];  seizures[  ];   paresthesias[  ];  difficulty walking[ Y  ];  Psych:depression[  ]; anxiety[  ];  Endocrine: diabetes[ Y ];  thyroid dysfunction[  ];  Other:  Past Medical History:  Diagnosis Date  . Aortic stenosis    moderate  . CKD (chronic kidney disease) stage 3, GFR 30-59 ml/min (HCC)   . Depression   . DM (diabetes mellitus), type 2 ([place])   . Gout   . HTN (hypertension)   . Hypercholesterolemia   . Hypothyroidism   . Iron deficiency anemia   . Sleep apnea    CPAP  . Syncope 06/10/2018   Medications Prior to Admission  Medication Sig Dispense Refill  . acetaminophen (TYLENOL) 500 MG tablet Take 1,000 mg by mouth every 6 (six) hours as needed for moderate pain.     Marland Kitchen allopurinol (ZYLOPRIM) 100 MG tablet Take 100 mg by mouth 2 (two) times daily.     . calcium carbonate (OS-CAL) 600 MG TABS tablet Take 600 mg by mouth daily.    . cetirizine (ZYRTEC) 10 MG tablet Take 10 mg by mouth at bedtime.       . Cyanocobalamin (VITAMIN B-12) 5000 MCG SUBL Place 5,000 mcg under the tongue daily.    . Ferrous Sulfate Dried (SLOW RELEASE IRON) 45 MG TBCR Take 45 mg by mouth daily.    . fluticasone (FLONASE) 50 MCG/ACT nasal spray Place 1 spray into both nostrils daily.     Marland Kitchen KLOR-CON M20 20 MEQ tablet TAKE 1 TABLET BY MOUTH TWICE A DAY (Patient taking differently: Take 20 mEq by mouth daily. ) 30 tablet 3  . Lactobacillus (ACIDOPHILUS) 100 MG CAPS Take 100 mg by mouth daily.     . Magnesium 400 MG TABS Take 400 mg by mouth daily.     . Multiple Vitamins-Minerals (WOMENS MULTIVITAMIN PO) Take 1 tablet by mouth daily.    . pantoprazole (PROTONIX) 40 MG tablet Take 1 tablet (40 mg total) by mouth 2 (two) times daily. (Patient taking differently: Take 40 mg by mouth daily. ) 60 tablet 0  . senna-docusate (SENOKOT-S) 8.6-50 MG tablet Take 1 tablet by mouth 2 (two) times daily. (Patient taking differently: Take 1 tablet by mouth at bedtime. ) 60 tablet 0  . simvastatin (ZOCOR) 40 MG tablet Take 20 mg by mouth daily.    Marland Kitchen SYNTHROID 100 MCG tablet Take 100 mcg by mouth daily before breakfast.     . torsemide (DEMADEX) 20 MG tablet TAKE 2 TABLETS (40 MG TOTAL) BY MOUTH 2 (TWO) TIMES DAILY. 120 tablet 3  . traZODone (DESYREL) 50 MG tablet Take 100 mg by mouth at bedtime.     . Turmeric 500 MG TABS Take 500 mg by mouth daily.     Marland Kitchen venlafaxine XR (EFFEXOR-XR) 75 MG 24 hr capsule Take 75 mg by mouth daily.    . metolazone (ZAROXOLYN) 2.5 MG tablet Take 1 tablet (2.5 mg total) by mouth daily as needed. Take 56min before Torsemide (Patient not taking: Reported on 06/10/2018) 10 tablet 0   .  allopurinol  100 mg Oral BID  . calcium carbonate  1 tablet Oral Q breakfast  . ferrous sulfate  325 mg Oral Daily  . fluticasone  1 spray Each Nare Daily  . lactobacillus acidophilus & bulgar  1 tablet Oral Daily  . levothyroxine  100 mcg Oral Q0600  . loratadine  10 mg Oral Daily  . magnesium oxide  400 mg Oral Daily  .  multivitamin with minerals  1 tablet Oral Daily  . pantoprazole  40 mg Oral BID  . senna-docusate  1 tablet Oral QHS  . simvastatin  20 mg Oral Daily  . torsemide  40 mg Oral BID  . traZODone  100 mg Oral QHS  . venlafaxine XR  75 mg Oral Daily  . vitamin B-12  5,000 mcg Oral Daily   Infusions:  No Known Allergies  Social History   Socioeconomic History  . Marital status: Widowed    Spouse name: Not on file  . Number of children: Not on file  . Years of education: Not on file  . Highest education level: Not on file  Occupational History  . Not on file  Social Needs  . Financial resource strain: Not on file  . Food insecurity:    Worry: Not on file    Inability: Not on file  . Transportation needs:    Medical: Not on file    Non-medical: Not on file  Tobacco Use  . Smoking status: Never Smoker  . Smokeless tobacco: Never Used  Substance and Sexual Activity  . Alcohol use: No  . Drug use: No  . Sexual activity: Not on file  Lifestyle  . Physical activity:    Days per week: Not on file    Minutes per session: Not on file  . Stress: Not on file  Relationships  . Social connections:    Talks on phone: Not on file    Gets together: Not on file    Attends religious service: Not on file    Active member of club or organization: Not on file    Attends meetings of clubs or organizations: Not on file    Relationship status: Not on file  . Intimate partner violence:    Fear of current or ex partner: Not on file    Emotionally abused: Not on file    Physically abused: Not on file    Forced sexual activity: Not on file  Other Topics Concern  . Not on file  Social History Narrative   Widowed   Lives with son and daughter-in-law   [name] here from [place] 03/25/2016   2 sons   Occupation: retired, was a Museum/gallery conservator with [employer]   Family History  Problem Relation Age of Onset  . Breast cancer Mother   . Colon cancer Father   . Prostate cancer Father     . Emphysema Father    PHYSICAL EXAM: Vitals:   06/10/18 0332 06/10/18 0404  BP: 103/60 (!) 90/59  Pulse: (!) 126 (!) 133  Resp: 17 14  Temp: 97.9 F (36.6 C) 98.9 F (37.2 C)  SpO2: 97% 100%    Intake/Output Summary (Last 24 hours) at 06/10/2018 0443 Last data filed at 06/10/2018 0129 Gross per 24 hour  Intake 1000 ml  Output -  Net 1000 ml   General:  Well appearing. No respiratory difficulty HEENT: normal Neck: supple. no JVD.  Cor: PMI nondisplaced. Irregularly, irregular, tachycardic. No rubs, gallops or murmurs. Lungs: diminished at  the bases Abdomen: soft, nontender, nondistended. No hepatosplenomegaly. No bruits or masses. Good bowel sounds. Extremities: no cyanosis, clubbing, rash, 2+ b/l pitting edema (much improved from baseline). Multiple skin excoriations/bruises Neuro: alert & oriented, moves all 4 extremities w/o difficulty. Affect pleasant.  ECG:  Results for orders placed or performed during the hospital encounter of 06/09/18 (from the past 24 hour(s))  CBG monitoring, ED     Status: Abnormal   Collection Time: 06/09/18 10:27 PM  Result Value Ref Range   Glucose-Capillary 137 (H) 70 - 99 mg/dL  Basic metabolic panel     Status: Abnormal   Collection Time: 06/09/18 10:38 PM  Result Value Ref Range   Sodium 140 135 - 145 mmol/L   Potassium 4.1 3.5 - 5.1 mmol/L   Chloride 104 98 - 111 mmol/L   CO2 32 22 - 32 mmol/L   Glucose, Bld 141 (H) 70 - 99 mg/dL   BUN 50 (H) 8 - 23 mg/dL   Creatinine, Ser 1.86 (H) 0.44 - 1.00 mg/dL   Calcium 8.3 (L) 8.9 - 10.3 mg/dL   GFR calc non Af Amer 24 (L) >60 mL/min   GFR calc Af Amer 28 (L) >60 mL/min   Anion gap 4 (L) 5 - 15  CBC     Status: Abnormal   Collection Time: 06/09/18 10:38 PM  Result Value Ref Range   WBC 7.9 4.0 - 10.5 K/uL   RBC 2.40 (L) 3.87 - 5.11 MIL/uL   Hemoglobin 7.0 (L) 12.0 - 15.0 g/dL   HCT 24.1 (L) 36.0 - 46.0 %   MCV 100.4 (H) 80.0 - 100.0 fL   MCH 29.2 26.0 - 34.0 pg   MCHC 29.0 (L)  30.0 - 36.0 g/dL   RDW 15.5 11.5 - 15.5 %   Platelets 208 150 - 400 K/uL   nRBC 0.0 0.0 - 0.2 %  Hepatic function panel     Status: Abnormal   Collection Time: 06/09/18 10:42 PM  Result Value Ref Range   Total Protein 6.5 6.5 - 8.1 g/dL   Albumin 2.5 (L) 3.5 - 5.0 g/dL   AST 34 15 - 41 U/L   ALT 21 0 - 44 U/L   Alkaline Phosphatase 85 38 - 126 U/L   Total Bilirubin 0.5 0.3 - 1.2 mg/dL   Bilirubin, Direct 0.2 0.0 - 0.2 mg/dL   Indirect Bilirubin 0.3 0.3 - 0.9 mg/dL  I-stat troponin, ED     Status: None   Collection Time: 06/09/18 10:44 PM  Result Value Ref Range   Troponin i, poc 0.01 0.00 - 0.08 ng/mL   Comment 3          Prepare RBC     Status: None   Collection Time: 06/10/18 12:05 AM  Result Value Ref Range   Order Confirmation      ORDER PROCESSED BY BLOOD BANK Performed at [facility], [address], [place], [place] [address]   Type and screen [place]     Status: None (Preliminary result)   Collection Time: 06/10/18 12:13 AM  Result Value Ref Range   ABO/RH(D) B POS    Antibody Screen NEG    Sample Expiration 06/13/2018    Unit Number [id number]    Blood Component Type RED CELLS,LR    Unit division 00    Status of Unit ISSUED    Transfusion Status OK TO TRANSFUSE    Crossmatch Result      Compatible Performed at  [facility], [place] [address] [place], [place] [address]    Unit Number [id number]    Blood Component Type RED CELLS,LR    Unit division 00    Status of Unit ALLOCATED    Transfusion Status OK TO TRANSFUSE    Crossmatch Result Compatible   Vitamin B12     Status: Abnormal   Collection Time: 06/10/18 12:13 AM  Result Value Ref Range   Vitamin B-12 6,913 (H) 180 - 914 pg/mL  Folate     Status: None   Collection Time: 06/10/18 12:13 AM  Result Value Ref Range   Folate 13.9 >5.9 ng/mL  Iron and TIBC     Status: Abnormal   Collection Time: 06/10/18 12:13 AM  Result Value Ref Range   Iron 23 (L) 28 - 170 ug/dL    TIBC 273 250 - 450 ug/dL   Saturation Ratios 8 (L) 10.4 - 31.8 %   UIBC 250 ug/dL  Ferritin     Status: None   Collection Time: 06/10/18 12:13 AM  Result Value Ref Range   Ferritin 73 11 - 307 ng/mL  Reticulocytes     Status: Abnormal   Collection Time: 06/10/18 12:13 AM  Result Value Ref Range   Retic Ct Pct 3.0 0.4 - 3.1 %   RBC. 2.26 (L) 3.87 - 5.11 MIL/uL   Retic Count, Absolute 66.9 19.0 - 186.0 K/uL   Immature Retic Fract 27.5 (H) 2.3 - 15.9 %  ABO/Rh     Status: None   Collection Time: 06/10/18 12:13 AM  Result Value Ref Range   ABO/RH(D)      B POS Performed at [facility], [place] [address], [place], [place] [address]   POC occult blood, ED RN will collect     Status: Abnormal   Collection Time: 06/10/18  1:44 AM  Result Value Ref Range   Fecal Occult Bld POSITIVE (A) NEGATIVE  Brain natriuretic peptide     Status: Abnormal   Collection Time: 06/10/18  2:03 AM  Result Value Ref Range   B Natriuretic Peptide 378.9 (H) 0.0 - 100.0 pg/mL  CBC     Status: Abnormal   Collection Time: 06/10/18  2:03 AM  Result Value Ref Range   WBC 7.8 4.0 - 10.5 K/uL   RBC 2.30 (L) 3.87 - 5.11 MIL/uL   Hemoglobin 6.8 (LL) 12.0 - 15.0 g/dL   HCT 23.2 (L) 36.0 - 46.0 %   MCV 100.9 (H) 80.0 - 100.0 fL   MCH 29.6 26.0 - 34.0 pg   MCHC 29.3 (L) 30.0 - 36.0 g/dL   RDW 15.7 (H) 11.5 - 15.5 %   Platelets 202 150 - 400 K/uL   nRBC 0.0 0.0 - 0.2 %  Troponin I - Now Then Q6H     Status: None   Collection Time: 06/10/18  2:03 AM  Result Value Ref Range   Troponin I <0.03 <0.03 ng/mL  TSH     Status: None   Collection Time: 06/10/18  2:03 AM  Result Value Ref Range   TSH 2.504 0.350 - 4.500 uIU/mL  Magnesium     Status: Abnormal   Collection Time: 06/10/18  2:03 AM  Result Value Ref Range   Magnesium 2.5 (H) 1.7 - 2.4 mg/dL   Dg Chest 2 View  Result Date: 06/09/2018 CLINICAL DATA:  Recent syncopal episode with fall at home EXAM: CHEST - 2 VIEW COMPARISON:  None. FINDINGS:  Cardiac shadow is mildly enlarged.  Aortic calcifications are seen. Mild central vascular congestion is noted without interstitial edema. Small right-sided pleural effusion is noted laterally. No focal infiltrate is seen. Degenerative changes of the thoracic spine are noted. IMPRESSION: Mild central vascular congestion with small right-sided pleural effusion. Electronically Signed   By: Inez Catalina M.D.   On: 06/09/2018 23:26   Dg Lumbar Spine Complete  Result Date: 06/09/2018 CLINICAL DATA:  Recent syncopal episode with low back pain, initial encounter EXAM: LUMBAR SPINE - COMPLETE 4+ VIEW COMPARISON:  None. FINDINGS: Vertebral body height is well maintained. Mild osteophytic changes are seen. L5 is partially sacralized. Mild degenerative anterolisthesis of L4 on L5 is noted. No soft tissue abnormality is noted. Aortic calcifications are seen. IMPRESSION: Mild degenerative change without acute abnormality. Electronically Signed   By: Inez Catalina M.D.   On: 06/09/2018 23:50   Ct Head Wo Contrast  Result Date: 06/10/2018 CLINICAL DATA:  Status post syncope and fall. Confusion. Concern for head or cervical spine injury. Initial encounter. EXAM: CT HEAD WITHOUT CONTRAST CT CERVICAL SPINE WITHOUT CONTRAST TECHNIQUE: Multidetector CT imaging of the head and cervical spine was performed following the standard protocol without intravenous contrast. Multiplanar CT image reconstructions of the cervical spine were also generated. COMPARISON:  None. FINDINGS: CT HEAD FINDINGS Brain: No evidence of acute infarction, hemorrhage, hydrocephalus, extra-axial collection or mass lesion / mass effect. Prominence of the ventricles and sulci reflects mild age-appropriate cortical volume loss. Mild periventricular and white matter change likely reflects small vessel ischemic microangiopathy. The brainstem and fourth ventricle are within normal limits. The basal ganglia are unremarkable in appearance. The cerebral hemispheres  demonstrate grossly normal gray-white differentiation. No mass effect or midline shift is seen. Vascular: No hyperdense vessel or unexpected calcification. Skull: There is no evidence of fracture; visualized osseous structures are unremarkable in appearance. Sinuses/Orbits: The visualized portions of the orbits are within normal limits. The paranasal sinuses and mastoid air cells are well-aerated. Other: No significant soft tissue abnormalities are seen. CT CERVICAL SPINE FINDINGS Alignment: Normal. Skull base and vertebrae: No acute fracture. No primary bone lesion or focal pathologic process. Soft tissues and spinal canal: No prevertebral fluid or swelling. No visible canal hematoma. Disc levels: Intervertebral disc spaces are preserved. The bony foramina are grossly unremarkable. Underlying facet disease is noted. Upper chest: The visualized lung apices are clear. The thyroid gland is only partially characterized but appears grossly unremarkable. Scattered calcification is noted at the carotid bifurcations bilaterally. Other: No additional soft tissue abnormalities are seen. IMPRESSION: 1. No evidence of traumatic intracranial injury or fracture. 2. No evidence of fracture or subluxation along the cervical spine. 3. Mild age-appropriate cortical volume loss and scattered small vessel ischemic microangiopathy. 4. Scattered calcification at the carotid bifurcations bilaterally. Carotid ultrasound would be helpful for further evaluation, when and as deemed clinically appropriate. Electronically Signed   By: Garald Balding M.D.   On: 06/10/2018 00:21   Ct Cervical Spine Wo Contrast  Result Date: 06/10/2018 CLINICAL DATA:  Status post syncope and fall. Confusion. Concern for head or cervical spine injury. Initial encounter. EXAM: CT HEAD WITHOUT CONTRAST CT CERVICAL SPINE WITHOUT CONTRAST TECHNIQUE: Multidetector CT imaging of the head and cervical spine was performed following the standard protocol without  intravenous contrast. Multiplanar CT image reconstructions of the cervical spine were also generated. COMPARISON:  None. FINDINGS: CT HEAD FINDINGS Brain: No evidence of acute infarction, hemorrhage, hydrocephalus, extra-axial collection or mass lesion / mass effect. Prominence of the ventricles and sulci reflects  mild age-appropriate cortical volume loss. Mild periventricular and white matter change likely reflects small vessel ischemic microangiopathy. The brainstem and fourth ventricle are within normal limits. The basal ganglia are unremarkable in appearance. The cerebral hemispheres demonstrate grossly normal gray-white differentiation. No mass effect or midline shift is seen. Vascular: No hyperdense vessel or unexpected calcification. Skull: There is no evidence of fracture; visualized osseous structures are unremarkable in appearance. Sinuses/Orbits: The visualized portions of the orbits are within normal limits. The paranasal sinuses and mastoid air cells are well-aerated. Other: No significant soft tissue abnormalities are seen. CT CERVICAL SPINE FINDINGS Alignment: Normal. Skull base and vertebrae: No acute fracture. No primary bone lesion or focal pathologic process. Soft tissues and spinal canal: No prevertebral fluid or swelling. No visible canal hematoma. Disc levels: Intervertebral disc spaces are preserved. The bony foramina are grossly unremarkable. Underlying facet disease is noted. Upper chest: The visualized lung apices are clear. The thyroid gland is only partially characterized but appears grossly unremarkable. Scattered calcification is noted at the carotid bifurcations bilaterally. Other: No additional soft tissue abnormalities are seen. IMPRESSION: 1. No evidence of traumatic intracranial injury or fracture. 2. No evidence of fracture or subluxation along the cervical spine. 3. Mild age-appropriate cortical volume loss and scattered small vessel ischemic microangiopathy. 4. Scattered  calcification at the carotid bifurcations bilaterally. Carotid ultrasound would be helpful for further evaluation, when and as deemed clinically appropriate. Electronically Signed   By: Garald Balding M.D.   On: 06/10/2018 00:21   ASSESSMENT:  Severe anemia (macrocytic, but questionable hx of GI bleeds) Fall syncopal vs. Mechanical  New onset afib with RVR (elevated chads2vasc) - will need to discuss long term anticoagulation given the dilated LA size. AKI on CKD with baseline Cr b/w 1.3-1.6. May likely be due to pre-renal etiology. Mild AS with mean gradient of 21 Moderate TR  PLAN/DISCUSSION:  Son and patient want aggressive measures if required. However, given her BP rate control medications are limited. Additionally, cardioversion is feasible after TEE, but we will need the anemia addressed prior to electrical cardioversion as she will need anticoagulation post cardioversion. La severely enlarged on last echo. Long term anticoagulation may also be a risk given her recent hx of frequent falls and pre-existing multiple bruises.  Recommended monitoring HR for now as she is asymptomatic and see if it improves after blood transfusion. FOBT pending. May benefit from a colonoscopy?  One option for rate control would be digoxin. However, she does have underlying CKD and one must renally dose the dig if rates don't improve s/p transfusion. Can consider amio, but given that patient might convert without knowing the duration of afib there is a concern for LAA clot and risk of stroke.  Recommend orthostatics given the questionable syncopal nature to the fall. Will continue to monitor on telemetry. Can consider holding diuretic for now as she appears to have developed some AKI.  Will repeat echo in the AM. This will help with reassessment of her valvular pathologies as well.  Echo from 10/2016 shows EF 82-50% with diastolic dysfunction. EKG shows afib with RVR with HR's in 130's with anterolateral T  wave inversions, but negative troponin.   Ikhlas Albo (Fellow)

## 2018-06-10 NOTE — Progress Notes (Signed)
Pt placed on CPAP for the night. 1L of 02 bleed in.  Mask secure, sp02 97%. No acute distress noted.

## 2018-06-11 DIAGNOSIS — I959 Hypotension, unspecified: Secondary | ICD-10-CM

## 2018-06-11 DIAGNOSIS — N184 Chronic kidney disease, stage 4 (severe): Secondary | ICD-10-CM | POA: Diagnosis not present

## 2018-06-11 DIAGNOSIS — I5032 Chronic diastolic (congestive) heart failure: Secondary | ICD-10-CM | POA: Diagnosis not present

## 2018-06-11 DIAGNOSIS — I4891 Unspecified atrial fibrillation: Secondary | ICD-10-CM | POA: Diagnosis not present

## 2018-06-11 LAB — TYPE AND SCREEN
ABO/RH(D): B POS
Antibody Screen: NEGATIVE
Unit division: 0
Unit division: 0

## 2018-06-11 LAB — BPAM RBC
Blood Product Expiration Date: 202001202359
Blood Product Expiration Date: 202001222359
ISSUE DATE / TIME: 201912270340
ISSUE DATE / TIME: 201912270751
Unit Type and Rh: 7300
Unit Type and Rh: 7300

## 2018-06-11 LAB — BASIC METABOLIC PANEL
Anion gap: 10 (ref 5–15)
BUN: 48 mg/dL — ABNORMAL HIGH (ref 8–23)
CO2: 24 mmol/L (ref 22–32)
Calcium: 8.1 mg/dL — ABNORMAL LOW (ref 8.9–10.3)
Chloride: 104 mmol/L (ref 98–111)
Creatinine, Ser: 1.71 mg/dL — ABNORMAL HIGH (ref 0.44–1.00)
GFR calc Af Amer: 31 mL/min — ABNORMAL LOW (ref >60)
GFR calc non Af Amer: 26 mL/min — ABNORMAL LOW (ref >60)
Glucose, Bld: 111 mg/dL — ABNORMAL HIGH (ref 70–99)
Potassium: 3.9 mmol/L (ref 3.5–5.1)
Sodium: 138 mmol/L (ref 135–145)

## 2018-06-11 LAB — CBC
HCT: 29.6 % — ABNORMAL LOW (ref 36.0–46.0)
Hemoglobin: 9.3 g/dL — ABNORMAL LOW (ref 12.0–15.0)
MCH: 29.5 pg (ref 26.0–34.0)
MCHC: 31.4 g/dL (ref 30.0–36.0)
MCV: 94 fL (ref 80.0–100.0)
Platelets: 194 10*3/uL (ref 150–400)
RBC: 3.15 MIL/uL — ABNORMAL LOW (ref 3.87–5.11)
RDW: 17.4 % — ABNORMAL HIGH (ref 11.5–15.5)
WBC: 7.5 10*3/uL (ref 4.0–10.5)
nRBC: 0 % (ref 0.0–0.2)

## 2018-06-11 MED ORDER — AMIODARONE HCL IN DEXTROSE 360-4.14 MG/200ML-% IV SOLN
30.0000 mg/h | INTRAVENOUS | Status: DC
  Administered 2018-06-11 – 2018-06-12 (×2): 30 mg/h via INTRAVENOUS
  Filled 2018-06-11 (×2): qty 200

## 2018-06-11 MED ORDER — AMIODARONE LOAD VIA INFUSION
150.0000 mg | Freq: Once | INTRAVENOUS | Status: AC
  Administered 2018-06-11: 150 mg via INTRAVENOUS
  Filled 2018-06-11: qty 83.34

## 2018-06-11 MED ORDER — AMIODARONE HCL IN DEXTROSE 360-4.14 MG/200ML-% IV SOLN
60.0000 mg/h | INTRAVENOUS | Status: DC
  Administered 2018-06-11 (×2): 60 mg/h via INTRAVENOUS
  Filled 2018-06-11: qty 200

## 2018-06-11 NOTE — Progress Notes (Signed)
PROGRESS NOTE  SARIN COMUNALE OMB:[id number] DOB: May 25, 1928 DOA: 06/09/2018 PCP: Rolene Course, PA-C   LOS: 0 days   Brief Narrative / Interim history: 84 year old female with history of hypertension, hyperlipidemia, diet-controlled diabetes mellitus, hypothyroidism, chronic kidney disease stage IV, sleep apnea on CPAP, diastolic CHF who was admitted to the hospital after having a fall.  She has also been complaining of intermittent shortness of breath, and also reports intermittent rectal bleeding for "quite some time".  She was found to have new onset of A. fib with RVR with worsening anemia with a hemoglobin of 7.0, and worsening creatinine.  She was admitted to the hospitalist service  Subjective: -Overall she is feeling well this morning, states that she has a little bit more energy.  Denies any chest pain, denies any palpitations.  She is also stating at the same time that she is feeling "wiped"  Assessment & Plan: Principal Problem:   Atrial fibrillation with RVR ([place]) Active Problems:   Iron deficiency anemia   Hypertension   Type II diabetes mellitus with renal manifestations (HCC)   Chronic diastolic HF (heart failure) (HCC)   CKD (chronic kidney disease), stage IV ([place])   New onset atrial fibrillation (HCC)   HLD (hyperlipidemia)   GERD (gastroesophageal reflux disease)   Hypothyroidism   Fall   Positive occult stool blood test   Principal Problem New onset A. fib with RVR -Cardiology consulted, appreciate input. Soft blood pressure limits beta-blocker and calcium channel blocker, to be started on amiodarone per cardiology -With worsening hemoglobin and history of recurrent falls she is not a great candidate for anticoagulation -2D echo still pending  Additional Problems Falls -PT to evaluate  Iron deficiency anemia/positive fecal occult -Discussed with Dr. Paulita Fujita, for now conservative management -Not a candidate for anticoagulation for A. fib -Status post  2 units of packed red blood cells on 12/27, hemoglobin now improved we will continue to monitor  Hypothyroidism -TSH within normal limits at 2.5, continue levothyroxine  Acute kidney injury on chronic kidney disease -Creatinine 1.86 on admission, baseline around 1.4, currently at 1.7, overall stable  Hypertension -For now hold antihypertensives as she is hypotensive  Type 2 diabetes mellitus, diet controlled -Glucose fairly controlled on BMP this morning  Chronic diastolic CHF -2D echo in 4536 showed grade 2 diastolic dysfunction with normal EF -Overall appears compensated, echo is pending  Hyperlipidemia -Continue home medications  Scheduled Meds: . allopurinol  100 mg Oral BID  . amiodarone  150 mg Intravenous Once  . calcium carbonate  1 tablet Oral Q breakfast  . ferrous sulfate  325 mg Oral Daily  . fluticasone  1 spray Each Nare Daily  . lactobacillus acidophilus & bulgar  1 tablet Oral Daily  . levothyroxine  100 mcg Oral Q0600  . loratadine  10 mg Oral Daily  . magnesium oxide  400 mg Oral Daily  . multivitamin with minerals  1 tablet Oral Daily  . pantoprazole  40 mg Oral BID  . senna-docusate  1 tablet Oral QHS  . simvastatin  20 mg Oral Daily  . traZODone  100 mg Oral QHS  . venlafaxine XR  75 mg Oral Daily  . vitamin B-12  5,000 mcg Oral Daily   Continuous Infusions: . amiodarone     Followed by  . amiodarone     PRN Meds:.acetaminophen **OR** acetaminophen, hydrALAZINE, hydrOXYzine, zolpidem  DVT prophylaxis: SCDs Code Status: Full code Family Communication: No family present at bedside Disposition Plan: Home versus SNF  when ready  Consultants:   Cardiology  Gastroenterology  Procedures:   None   Antimicrobials:  None    Objective: Vitals:   06/10/18 2045 06/10/18 2300 06/11/18 0437 06/11/18 1243  BP: (!) 86/60  98/63 (!) 91/50  Pulse: 100 (!) 110 (!) 135 (!) 119  Resp: 15 19 16  (!) 22  Temp: 98.7 F (37.1 C)  97.9 F (36.6  C) 98.4 F (36.9 C)  TempSrc: Oral   Oral  SpO2: 97% 97% 97% 97%  Weight:   66 kg   Height:        Intake/Output Summary (Last 24 hours) at 06/11/2018 1254 Last data filed at 06/11/2018 1025 Gross per 24 hour  Intake 717.78 ml  Output 1601 ml  Net -883.22 ml   Filed Weights   06/09/18 2229 06/10/18 0500 06/11/18 0437  Weight: 61.2 kg 66.4 kg 66 kg    Examination:  Constitutional: NAD Eyes:lids and conjunctivae normal ENMT: Mucous membranes are moist. Neck: normal, supple Respiratory: clear to auscultation bilaterally, no wheezing, no crackles. Cardiovascular: Irregularly irregular, 3/6 SEM, tachycardic.  Good pulses.  No peripheral edema Abdomen: no tenderness. Bowel sounds positive.  Musculoskeletal: no clubbing / cyanosis.  Skin: no rashes Neurologic: CN 2-12 grossly intact. Strength 5/5 in all 4.  Psychiatric: Normal judgment and insight. Alert and oriented x 3. Normal mood.    Data Reviewed: I have independently reviewed following labs and imaging studies   CBC: Recent Labs  Lab 06/10/18 0203 06/10/18 0734 06/10/18 1411 06/10/18 1926 06/11/18 0332  WBC 7.8 6.9 6.5 7.5 7.5  HGB 6.8* 8.0* 9.4* 9.6* 9.3*  HCT 23.2* 25.7* 29.4* 30.2* 29.6*  MCV 100.9* 97.3 93.3 93.5 94.0  PLT 202 200 193 208 578   Basic Metabolic Panel: Recent Labs  Lab 06/09/18 2238 06/10/18 0203 06/10/18 0734 06/11/18 0332  NA 140  --  141 138  K 4.1  --  3.9 3.9  CL 104  --  104 104  CO2 32  --  24 24  GLUCOSE 141*  --  111* 111*  BUN 50*  --  47* 48*  CREATININE 1.86*  --  1.68* 1.71*  CALCIUM 8.3*  --  8.0* 8.1*  MG  --  2.5*  --   --    GFR: Estimated Creatinine Clearance: 20.7 mL/min (A) (by C-G formula based on SCr of 1.71 mg/dL (H)). Liver Function Tests: Recent Labs  Lab 06/09/18 2242  AST 34  ALT 21  ALKPHOS 85  BILITOT 0.5  PROT 6.5  ALBUMIN 2.5*   No results for input(s): LIPASE, AMYLASE in the last 168 hours. No results for input(s): AMMONIA in the last  168 hours. Coagulation Profile: No results for input(s): INR, PROTIME in the last 168 hours. Cardiac Enzymes: Recent Labs  Lab 06/10/18 0203 06/10/18 0734 06/10/18 1411  TROPONINI <0.03 <0.03 <0.03   BNP (last 3 results) No results for input(s): PROBNP in the last 8760 hours. HbA1C: No results for input(s): HGBA1C in the last 72 hours. CBG: Recent Labs  Lab 06/09/18 2227  GLUCAP 137*   Lipid Profile: No results for input(s): CHOL, HDL, LDLCALC, TRIG, CHOLHDL, LDLDIRECT in the last 72 hours. Thyroid Function Tests: Recent Labs    06/10/18 0203  TSH 2.504   Anemia Panel: Recent Labs    06/10/18 0013  VITAMINB12 6,913*  FOLATE 13.9  FERRITIN 73  TIBC 273  IRON 23*  RETICCTPCT 3.0   Urine analysis:    Component Value Date/Time  COLORURINE YELLOW 06/10/2018 [place] 06/10/2018 1416   LABSPEC 1.015 06/10/2018 1416   PHURINE 5.0 06/10/2018 1416   GLUCOSEU NEGATIVE 06/10/2018 1416   HGBUR NEGATIVE 06/10/2018 1416   [place] 06/10/2018 1416   [place] 06/10/2018 1416   PROTEINUR NEGATIVE 06/10/2018 1416   NITRITE NEGATIVE 06/10/2018 1416   LEUKOCYTESUR NEGATIVE 06/10/2018 1416   Sepsis Labs: Invalid input(s): PROCALCITONIN, LACTICIDVEN  No results found for this or any previous visit (from the past 240 hour(s)).    Radiology Studies: Dg Chest 2 View  Result Date: 06/09/2018 CLINICAL DATA:  Recent syncopal episode with fall at home EXAM: CHEST - 2 VIEW COMPARISON:  None. FINDINGS: Cardiac shadow is mildly enlarged. Aortic calcifications are seen. Mild central vascular congestion is noted without interstitial edema. Small right-sided pleural effusion is noted laterally. No focal infiltrate is seen. Degenerative changes of the thoracic spine are noted. IMPRESSION: Mild central vascular congestion with small right-sided pleural effusion. Electronically Signed   By: Inez Catalina M.D.   On: 06/09/2018 23:26   Dg Lumbar Spine  Complete  Result Date: 06/09/2018 CLINICAL DATA:  Recent syncopal episode with low back pain, initial encounter EXAM: LUMBAR SPINE - COMPLETE 4+ VIEW COMPARISON:  None. FINDINGS: Vertebral body height is well maintained. Mild osteophytic changes are seen. L5 is partially sacralized. Mild degenerative anterolisthesis of L4 on L5 is noted. No soft tissue abnormality is noted. Aortic calcifications are seen. IMPRESSION: Mild degenerative change without acute abnormality. Electronically Signed   By: Inez Catalina M.D.   On: 06/09/2018 23:50   Ct Head Wo Contrast  Result Date: 06/10/2018 CLINICAL DATA:  Status post syncope and fall. Confusion. Concern for head or cervical spine injury. Initial encounter. EXAM: CT HEAD WITHOUT CONTRAST CT CERVICAL SPINE WITHOUT CONTRAST TECHNIQUE: Multidetector CT imaging of the head and cervical spine was performed following the standard protocol without intravenous contrast. Multiplanar CT image reconstructions of the cervical spine were also generated. COMPARISON:  None. FINDINGS: CT HEAD FINDINGS Brain: No evidence of acute infarction, hemorrhage, hydrocephalus, extra-axial collection or mass lesion / mass effect. Prominence of the ventricles and sulci reflects mild age-appropriate cortical volume loss. Mild periventricular and white matter change likely reflects small vessel ischemic microangiopathy. The brainstem and fourth ventricle are within normal limits. The basal ganglia are unremarkable in appearance. The cerebral hemispheres demonstrate grossly normal gray-white differentiation. No mass effect or midline shift is seen. Vascular: No hyperdense vessel or unexpected calcification. Skull: There is no evidence of fracture; visualized osseous structures are unremarkable in appearance. Sinuses/Orbits: The visualized portions of the orbits are within normal limits. The paranasal sinuses and mastoid air cells are well-aerated. Other: No significant soft tissue abnormalities  are seen. CT CERVICAL SPINE FINDINGS Alignment: Normal. Skull base and vertebrae: No acute fracture. No primary bone lesion or focal pathologic process. Soft tissues and spinal canal: No prevertebral fluid or swelling. No visible canal hematoma. Disc levels: Intervertebral disc spaces are preserved. The bony foramina are grossly unremarkable. Underlying facet disease is noted. Upper chest: The visualized lung apices are clear. The thyroid gland is only partially characterized but appears grossly unremarkable. Scattered calcification is noted at the carotid bifurcations bilaterally. Other: No additional soft tissue abnormalities are seen. IMPRESSION: 1. No evidence of traumatic intracranial injury or fracture. 2. No evidence of fracture or subluxation along the cervical spine. 3. Mild age-appropriate cortical volume loss and scattered small vessel ischemic microangiopathy. 4. Scattered calcification at the carotid bifurcations bilaterally. Carotid ultrasound would  be helpful for further evaluation, when and as deemed clinically appropriate. Electronically Signed   By: Garald Balding M.D.   On: 06/10/2018 00:21   Ct Cervical Spine Wo Contrast  Result Date: 06/10/2018 CLINICAL DATA:  Status post syncope and fall. Confusion. Concern for head or cervical spine injury. Initial encounter. EXAM: CT HEAD WITHOUT CONTRAST CT CERVICAL SPINE WITHOUT CONTRAST TECHNIQUE: Multidetector CT imaging of the head and cervical spine was performed following the standard protocol without intravenous contrast. Multiplanar CT image reconstructions of the cervical spine were also generated. COMPARISON:  None. FINDINGS: CT HEAD FINDINGS Brain: No evidence of acute infarction, hemorrhage, hydrocephalus, extra-axial collection or mass lesion / mass effect. Prominence of the ventricles and sulci reflects mild age-appropriate cortical volume loss. Mild periventricular and white matter change likely reflects small vessel ischemic  microangiopathy. The brainstem and fourth ventricle are within normal limits. The basal ganglia are unremarkable in appearance. The cerebral hemispheres demonstrate grossly normal gray-white differentiation. No mass effect or midline shift is seen. Vascular: No hyperdense vessel or unexpected calcification. Skull: There is no evidence of fracture; visualized osseous structures are unremarkable in appearance. Sinuses/Orbits: The visualized portions of the orbits are within normal limits. The paranasal sinuses and mastoid air cells are well-aerated. Other: No significant soft tissue abnormalities are seen. CT CERVICAL SPINE FINDINGS Alignment: Normal. Skull base and vertebrae: No acute fracture. No primary bone lesion or focal pathologic process. Soft tissues and spinal canal: No prevertebral fluid or swelling. No visible canal hematoma. Disc levels: Intervertebral disc spaces are preserved. The bony foramina are grossly unremarkable. Underlying facet disease is noted. Upper chest: The visualized lung apices are clear. The thyroid gland is only partially characterized but appears grossly unremarkable. Scattered calcification is noted at the carotid bifurcations bilaterally. Other: No additional soft tissue abnormalities are seen. IMPRESSION: 1. No evidence of traumatic intracranial injury or fracture. 2. No evidence of fracture or subluxation along the cervical spine. 3. Mild age-appropriate cortical volume loss and scattered small vessel ischemic microangiopathy. 4. Scattered calcification at the carotid bifurcations bilaterally. Carotid ultrasound would be helpful for further evaluation, when and as deemed clinically appropriate. Electronically Signed   By: Garald Balding M.D.   On: 06/10/2018 00:21     Marzetta Board, MD, PhD [facility] Pager [phone]  If 7PM-7AM, please contact night-coverage [email or web address] Password [facility] 06/11/2018, 12:54 PM

## 2018-06-11 NOTE — Progress Notes (Signed)
PT Cancellation Note  Patient Details Name: Madison Riggs MRN: [id number] DOB: 04-06-1928   Cancelled Treatment:    Reason Eval/Treat Not Completed: Fatigue/lethargy limiting ability to participate. Pt declining PT eval at this time, stating she is tired and also has visitors. PT to re-attempt as time allows.   Lorriane Shire 06/11/2018, 10:48 AM  Lorrin Goodell, PT  Office # [phone] Pager [phone]

## 2018-06-11 NOTE — Evaluation (Signed)
Physical Therapy Evaluation Patient Details Name: Madison Riggs MRN: [id number] DOB: 1928-03-04 Today's Date: 06/11/2018   History of Present Illness  Madison Riggs is a 84 y.o. female with medical history significant of hypertension, hyperlipidemia, diet-controlled diabetes, GERD, hypothyroidism, gout, aortic stenosis, CKD 4, OSA on CPAP, iron deficiency anemia, mild dementia, dCHF, gastric bypass surgery, presents with fall.    Clinical Impression  Pt admitted with above diagnosis. Pt currently with functional limitations due to the deficits listed below (see PT Problem List). On eval, pt required min assist bed mobility, min assist transfers, and min guard assist ambulation 150 feet with rollator. Fatigue noted upon return to bed at end of session. Pt will benefit from skilled PT to increase their independence and safety with mobility to allow discharge to the venue listed below.       Follow Up Recommendations Home health PT;Supervision for mobility/OOB    Equipment Recommendations  None recommended by PT    Recommendations for Other Services       Precautions / Restrictions Precautions Precautions: Fall;Other (comment) Precaution Comments: watch HR      Mobility  Bed Mobility Overal bed mobility: Needs Assistance Bed Mobility: Supine to Sit;Sit to Supine     Supine to sit: Min assist;HOB elevated Sit to supine: Min guard;HOB elevated   General bed mobility comments: +rail, increased time and effort  Transfers Overall transfer level: Needs assistance Equipment used: 4-wheeled walker Transfers: Sit to/from Stand Sit to Stand: Min assist         General transfer comment: cues for hand placement, assist to power up  Ambulation/Gait Ambulation/Gait assistance: Min guard Gait Distance (Feet): 150 Feet Assistive device: 4-wheeled walker Gait Pattern/deviations: Step-through pattern;Decreased stride length Gait velocity: decreased Gait velocity  interpretation: 1.31 - 2.62 ft/sec, indicative of limited community ambulator General Gait Details: max HR 124, 2/4 DOE with 30 RR, SpO2 95% on RA  Stairs            Wheelchair Mobility    Modified Rankin (Stroke Patients Only)       Balance Overall balance assessment: Needs assistance Sitting-balance support: No upper extremity supported;Feet supported Sitting balance-Leahy Scale: Good     Standing balance support: No upper extremity supported;During functional activity   Standing balance comment: Pt able to demonstrate static standing with no UE support                             Pertinent Vitals/Pain Pain Assessment: 0-10 Pain Score: 2  Pain Location: low back (chronic) Pain Descriptors / Indicators: Sore Pain Intervention(s): Repositioned;Limited activity within patient's tolerance    Home Living Family/patient expects to be discharged to:: Private residence Living Arrangements: Children Available Help at Discharge: Family;Available 24 hours/day Type of Home: House Home Access: Ramped entrance     Home Layout: Able to live on main level with bedroom/bathroom;Multi-level Home Equipment: Walker - 4 wheels;Hand held shower head      Prior Function Level of Independence: Independent with assistive device(s)         Comments: uses a Rollator at baseline, independent in ADL - does not perform IADL and does not drive anymore     Hand Dominance   Dominant Hand: Right    Extremity/Trunk Assessment   Upper Extremity Assessment Upper Extremity Assessment: Generalized weakness    Lower Extremity Assessment Lower Extremity Assessment: Generalized weakness    Cervical / Trunk Assessment Cervical / Trunk Assessment: Kyphotic  Communication   Communication: No difficulties  Cognition Arousal/Alertness: Awake/alert Behavior During Therapy: WFL for tasks assessed/performed Overall Cognitive Status: Within Functional Limits for tasks  assessed                                        General Comments      Exercises     Assessment/Plan    PT Assessment Patient needs continued PT services  PT Problem List Decreased strength;Decreased balance;Pain;Cardiopulmonary status limiting activity;Decreased mobility;Decreased activity tolerance       PT Treatment Interventions Functional mobility training;Balance training;Gait training;Patient/family education;Therapeutic activities;Therapeutic exercise    PT Goals (Current goals can be found in the Care Plan section)  Acute Rehab PT Goals Patient Stated Goal: feel better PT Goal Formulation: With patient Time For Goal Achievement: 06/25/18 Potential to Achieve Goals: Good    Frequency Min 3X/week   Barriers to discharge        Co-evaluation               AM-PAC PT "6 Clicks" Mobility  Outcome Measure Help needed turning from your back to your side while in a flat bed without using bedrails?: None Help needed moving from lying on your back to sitting on the side of a flat bed without using bedrails?: A Little Help needed moving to and from a bed to a chair (including a wheelchair)?: A Little Help needed standing up from a chair using your arms (e.g., wheelchair or bedside chair)?: A Little Help needed to walk in hospital room?: A Little Help needed climbing 3-5 steps with a railing? : A Little 6 Click Score: 19    End of Session Equipment Utilized During Treatment: Gait belt Activity Tolerance: Patient tolerated treatment well Patient left: in bed;with call bell/phone within reach Nurse Communication: Mobility status PT Visit Diagnosis: Difficulty in walking, not elsewhere classified (R26.2)    Time: 2446-2863 PT Time Calculation (min) (ACUTE ONLY): 26 min   Charges:   PT Evaluation $PT Eval Low Complexity: 1 Low PT Treatments $Gait Training: 8-22 mins        Lorrin Goodell, PT  Office # [phone] Pager [phone]   Lorriane Shire 06/11/2018, 2:19 PM

## 2018-06-11 NOTE — Progress Notes (Addendum)
Progress Note  Patient Name: Madison Riggs Date of Encounter: 06/11/2018  Primary Cardiologist: Minus Breeding, MD   Subjective   Patient admitted after suffering a fall and was found to be in atrial fibrillation with RVR and found to have a hemoglobin of 7.0. She has received PRBCs.  She denies any chest pain or SOB at this time.  Inpatient Medications    Scheduled Meds: . allopurinol  100 mg Oral BID  . calcium carbonate  1 tablet Oral Q breakfast  . ferrous sulfate  325 mg Oral Daily  . fluticasone  1 spray Each Nare Daily  . lactobacillus acidophilus & bulgar  1 tablet Oral Daily  . levothyroxine  100 mcg Oral Q0600  . loratadine  10 mg Oral Daily  . magnesium oxide  400 mg Oral Daily  . multivitamin with minerals  1 tablet Oral Daily  . pantoprazole  40 mg Oral BID  . senna-docusate  1 tablet Oral QHS  . simvastatin  20 mg Oral Daily  . traZODone  100 mg Oral QHS  . venlafaxine XR  75 mg Oral Daily  . vitamin B-12  5,000 mcg Oral Daily   Continuous Infusions:  PRN Meds: acetaminophen **OR** acetaminophen, hydrALAZINE, hydrOXYzine, zolpidem   Vital Signs    Vitals:   06/10/18 1140 06/10/18 2045 06/10/18 2300 06/11/18 0437  BP: (!) 88/62 (!) 86/60  98/63  Pulse:  100 (!) 110 (!) 135  Resp: (!) 25 15 19 16   Temp:  98.7 F (37.1 C)  97.9 F (36.6 C)  TempSrc:  Oral    SpO2:  97% 97% 97%  Weight:    66 kg  Height:        Intake/Output Summary (Last 24 hours) at 06/11/2018 1217 Last data filed at 06/11/2018 1025 Gross per 24 hour  Intake 717.78 ml  Output 1601 ml  Net -883.22 ml   Filed Weights   06/09/18 2229 06/10/18 0500 06/11/18 0437  Weight: 61.2 kg 66.4 kg 66 kg    Telemetry    Atrial fibrillation with heart rates ranging between 110's and 150's. Currently in the 130's to 140's. - Personally Reviewed  ECG    Atrial fibrillation with RVR (130 bpm) with nonspecific ST/T changes.  - Personally Reviewed  Physical Exam   GEN: Well  nourished, well developed in no acute distress HEENT: Normal NECK: No JVD; No carotid bruits LYMPHATICS: No lymphadenopathy CARDIAC:irregularly irregular, no  rubs, gallops.  2/6 SM at RUSB to LLSB RESPIRATORY:  Clear to auscultation without rales, wheezing or rhonchi  ABDOMEN: Soft, non-tender, non-distended MUSCULOSKELETAL:  No edema; No deformity  SKIN: Warm and dry NEUROLOGIC:  Alert and oriented x 3 PSYCHIATRIC:  Normal affect    Labs    Chemistry Recent Labs  Lab 06/09/18 2238 06/09/18 2242 06/10/18 0734 06/11/18 0332  NA 140  --  141 138  K 4.1  --  3.9 3.9  CL 104  --  104 104  CO2 32  --  24 24  GLUCOSE 141*  --  111* 111*  BUN 50*  --  47* 48*  CREATININE 1.86*  --  1.68* 1.71*  CALCIUM 8.3*  --  8.0* 8.1*  PROT  --  6.5  --   --   ALBUMIN  --  2.5*  --   --   AST  --  34  --   --   ALT  --  21  --   --  ALKPHOS  --  85  --   --   BILITOT  --  0.5  --   --   GFRNONAA 24*  --  27* 26*  GFRAA 28*  --  31* 31*  ANIONGAP 4*  --  13 10     Hematology Recent Labs  Lab 06/10/18 1411 06/10/18 1926 06/11/18 0332  WBC 6.5 7.5 7.5  RBC 3.15* 3.23* 3.15*  HGB 9.4* 9.6* 9.3*  HCT 29.4* 30.2* 29.6*  MCV 93.3 93.5 94.0  MCH 29.8 29.7 29.5  MCHC 32.0 31.8 31.4  RDW 17.2* 17.5* 17.4*  PLT 193 208 194    Cardiac Enzymes Recent Labs  Lab 06/10/18 0203 06/10/18 0734 06/10/18 1411  TROPONINI <0.03 <0.03 <0.03    Recent Labs  Lab 06/09/18 2244  TROPIPOC 0.01     BNP Recent Labs  Lab 06/10/18 0203  BNP 378.9*     DDimer No results for input(s): DDIMER in the last 168 hours.   Radiology    Dg Chest 2 View  Result Date: 06/09/2018 CLINICAL DATA:  Recent syncopal episode with fall at home EXAM: CHEST - 2 VIEW COMPARISON:  None. FINDINGS: Cardiac shadow is mildly enlarged. Aortic calcifications are seen. Mild central vascular congestion is noted without interstitial edema. Small right-sided pleural effusion is noted laterally. No focal infiltrate  is seen. Degenerative changes of the thoracic spine are noted. IMPRESSION: Mild central vascular congestion with small right-sided pleural effusion. Electronically Signed   By: Inez Catalina M.D.   On: 06/09/2018 23:26   Dg Lumbar Spine Complete  Result Date: 06/09/2018 CLINICAL DATA:  Recent syncopal episode with low back pain, initial encounter EXAM: LUMBAR SPINE - COMPLETE 4+ VIEW COMPARISON:  None. FINDINGS: Vertebral body height is well maintained. Mild osteophytic changes are seen. L5 is partially sacralized. Mild degenerative anterolisthesis of L4 on L5 is noted. No soft tissue abnormality is noted. Aortic calcifications are seen. IMPRESSION: Mild degenerative change without acute abnormality. Electronically Signed   By: Inez Catalina M.D.   On: 06/09/2018 23:50   Ct Head Wo Contrast  Result Date: 06/10/2018 CLINICAL DATA:  Status post syncope and fall. Confusion. Concern for head or cervical spine injury. Initial encounter. EXAM: CT HEAD WITHOUT CONTRAST CT CERVICAL SPINE WITHOUT CONTRAST TECHNIQUE: Multidetector CT imaging of the head and cervical spine was performed following the standard protocol without intravenous contrast. Multiplanar CT image reconstructions of the cervical spine were also generated. COMPARISON:  None. FINDINGS: CT HEAD FINDINGS Brain: No evidence of acute infarction, hemorrhage, hydrocephalus, extra-axial collection or mass lesion / mass effect. Prominence of the ventricles and sulci reflects mild age-appropriate cortical volume loss. Mild periventricular and white matter change likely reflects small vessel ischemic microangiopathy. The brainstem and fourth ventricle are within normal limits. The basal ganglia are unremarkable in appearance. The cerebral hemispheres demonstrate grossly normal gray-white differentiation. No mass effect or midline shift is seen. Vascular: No hyperdense vessel or unexpected calcification. Skull: There is no evidence of fracture; visualized  osseous structures are unremarkable in appearance. Sinuses/Orbits: The visualized portions of the orbits are within normal limits. The paranasal sinuses and mastoid air cells are well-aerated. Other: No significant soft tissue abnormalities are seen. CT CERVICAL SPINE FINDINGS Alignment: Normal. Skull base and vertebrae: No acute fracture. No primary bone lesion or focal pathologic process. Soft tissues and spinal canal: No prevertebral fluid or swelling. No visible canal hematoma. Disc levels: Intervertebral disc spaces are preserved. The bony foramina are grossly unremarkable. Underlying facet disease  is noted. Upper chest: The visualized lung apices are clear. The thyroid gland is only partially characterized but appears grossly unremarkable. Scattered calcification is noted at the carotid bifurcations bilaterally. Other: No additional soft tissue abnormalities are seen. IMPRESSION: 1. No evidence of traumatic intracranial injury or fracture. 2. No evidence of fracture or subluxation along the cervical spine. 3. Mild age-appropriate cortical volume loss and scattered small vessel ischemic microangiopathy. 4. Scattered calcification at the carotid bifurcations bilaterally. Carotid ultrasound would be helpful for further evaluation, when and as deemed clinically appropriate. Electronically Signed   By: Garald Balding M.D.   On: 06/10/2018 00:21   Ct Cervical Spine Wo Contrast  Result Date: 06/10/2018 CLINICAL DATA:  Status post syncope and fall. Confusion. Concern for head or cervical spine injury. Initial encounter. EXAM: CT HEAD WITHOUT CONTRAST CT CERVICAL SPINE WITHOUT CONTRAST TECHNIQUE: Multidetector CT imaging of the head and cervical spine was performed following the standard protocol without intravenous contrast. Multiplanar CT image reconstructions of the cervical spine were also generated. COMPARISON:  None. FINDINGS: CT HEAD FINDINGS Brain: No evidence of acute infarction, hemorrhage,  hydrocephalus, extra-axial collection or mass lesion / mass effect. Prominence of the ventricles and sulci reflects mild age-appropriate cortical volume loss. Mild periventricular and white matter change likely reflects small vessel ischemic microangiopathy. The brainstem and fourth ventricle are within normal limits. The basal ganglia are unremarkable in appearance. The cerebral hemispheres demonstrate grossly normal gray-white differentiation. No mass effect or midline shift is seen. Vascular: No hyperdense vessel or unexpected calcification. Skull: There is no evidence of fracture; visualized osseous structures are unremarkable in appearance. Sinuses/Orbits: The visualized portions of the orbits are within normal limits. The paranasal sinuses and mastoid air cells are well-aerated. Other: No significant soft tissue abnormalities are seen. CT CERVICAL SPINE FINDINGS Alignment: Normal. Skull base and vertebrae: No acute fracture. No primary bone lesion or focal pathologic process. Soft tissues and spinal canal: No prevertebral fluid or swelling. No visible canal hematoma. Disc levels: Intervertebral disc spaces are preserved. The bony foramina are grossly unremarkable. Underlying facet disease is noted. Upper chest: The visualized lung apices are clear. The thyroid gland is only partially characterized but appears grossly unremarkable. Scattered calcification is noted at the carotid bifurcations bilaterally. Other: No additional soft tissue abnormalities are seen. IMPRESSION: 1. No evidence of traumatic intracranial injury or fracture. 2. No evidence of fracture or subluxation along the cervical spine. 3. Mild age-appropriate cortical volume loss and scattered small vessel ischemic microangiopathy. 4. Scattered calcification at the carotid bifurcations bilaterally. Carotid ultrasound would be helpful for further evaluation, when and as deemed clinically appropriate. Electronically Signed   By: Garald Balding M.D.    On: 06/10/2018 00:21    Cardiac Studies   Echo pending.  Patient Profile   Ms. Blinn is a 84 y.o. female with a history of diastolic congestive heart failure, aortic stenosis, hypertension, hyperlipidemia, diet-controlled diabetes, obstructive sleep apnea on CPAP, GERD, hypothyroidism, and CKD stage IV who presented to the [facility] ED on 06/09/2018 after suffering a fall. Cardiology was consulted for evaluation of atrial fibrillation with RVR.   Assessment & Plan    New Onset Atrial Fibrillation with RVR - Patient present to the ED after falling and was found to be in atrial fibrillation with RVR. Also found to have a hemoglobin of 7. - Anticoagulation not initiated due to patient's hemoglobin. Do not know if patient will be a candidate for long term anticoagulation either due to frequent falls and  possible chronic GI bleed. - Hbg is now up to 9.3 so unlikely that anemia is contributing to elevated HR at this time. - Echo results pending - AV nodal agents have not been initiated due to patient's hypotension which has persistent with SBP in the upper 80's to low 90's today - not many options at this time for rate control due to soft BP.   -recommend starting Amiodarone IV to try to get rate controlled.  There is a risk of conversion to NSR with risk of CVA but needs rate control and cannot use BB or CCB due to soft BP and would not recommend dig in elderly patient with reduced renal function. -QTC difficult to assess in afib with RVR but I calculated QTC to be 432msec and not the 530msec calculated by the EKG computer -follow daily EKG  Iron Deficiency Anemia - Patient does have chronic anemia with baseline hemoglobin appearing to be in the 9 to 10 range. However, patient presented to the ED with hemoglobin of 7, which then dropped to 6.8. - Patient may have slow, chronic GI bleed. She reports dark blood on toilet paper after having bowel movements. - Patient has received 2 units  PRBCs - Hbg improved to 9.3 today.  - Per primary team and GI.  Chronic Diastolic CHF - Most recent Echo from 10/2016 showed LVEF of 60-65% with hypokinesis of the mid-apicalanteroseptal myocardium and grade 2 diastolic dysfunction. - LE edema has improved but has crackles on exam today - Check BNP and Cxray today - Torsemide on hold at this time due to hypotension. - Echo pending. - Will continue to monitor volume status.   For questions or updates, please contact [place] Please consult [email or web address] for contact info under        Signed, Fransico Him, MD  06/11/2018, 12:17 PM

## 2018-06-11 NOTE — Progress Notes (Signed)
  Amiodarone Drug - Drug Interaction Consult Note  Recommendations: No recommended changes at this time. Continue to monitor vital signs and for future interactions.   Amiodarone is metabolized by the cytochrome P450 system and therefore has the potential to cause many drug interactions. Amiodarone has an average plasma half-life of 50 days (range 20 to 100 days).   There is potential for drug interactions to occur several weeks or months after stopping treatment and the onset of drug interactions may be slow after initiating amiodarone.   []  Statins: Increased risk of myopathy. Simvastatin- restrict dose to 20mg  daily. Other statins: counsel patients to report any muscle pain or weakness immediately.  []  Anticoagulants: Amiodarone can increase anticoagulant effect. Consider warfarin dose reduction. Patients should be monitored closely and the dose of anticoagulant altered accordingly, remembering that amiodarone levels take several weeks to stabilize.  []  Antiepileptics: Amiodarone can increase plasma concentration of phenytoin, the dose should be reduced. Note that small changes in phenytoin dose can result in large changes in levels. Monitor patient and counsel on signs of toxicity.  []  Beta blockers: increased risk of bradycardia, AV block and myocardial depression. Sotalol - avoid concomitant use.  []   Calcium channel blockers (diltiazem and verapamil): increased risk of bradycardia, AV block and myocardial depression.  []   Cyclosporine: Amiodarone increases levels of cyclosporine. Reduced dose of cyclosporine is recommended.  []  Digoxin dose should be halved when amiodarone is started.  []  Diuretics: increased risk of cardiotoxicity if hypokalemia occurs.  []  Oral hypoglycemic agents (glyburide, glipizide, glimepiride): increased risk of hypoglycemia. Patient's glucose levels should be monitored closely when initiating amiodarone therapy.   []  Drugs that prolong the QT interval:   Torsades de pointes risk may be increased with concurrent use - avoid if possible.  Monitor QTc, also keep magnesium/potassium WNL if concurrent therapy can't be avoided. Marland Kitchen Antibiotics: e.g. fluoroquinolones, erythromycin. . Antiarrhythmics: e.g. quinidine, procainamide, disopyramide, sotalol. . Antipsychotics: e.g. phenothiazines, haloperidol.  . Lithium, tricyclic antidepressants, and methadone.  Thank You,  Antonietta Jewel, PharmD, [place] Clinical Pharmacist  Pager: [phone] Phone: [phone] 06/11/2018 12:46 PM

## 2018-06-11 NOTE — Evaluation (Addendum)
Occupational Therapy Evaluation Patient Details Name: Madison Riggs MRN: [id number] DOB: 1927/08/20 Today's Date: 06/11/2018    History of Present Illness Madison Riggs is a 84 y.o. female with medical history significant of hypertension, hyperlipidemia, diet-controlled diabetes, GERD, hypothyroidism, gout, aortic stenosis, CKD 4, OSA on CPAP, iron deficiency anemia, mild dementia, dCHF, gastric bypass surgery, presents with fall.   Clinical Impression   PTA Pt mod I for mobility and ADL with DME (uses Rollator at baseline). Pt is currently limited by HR (ranged from 115-145 throughout session) but able to complete stand pivot transfer with min A for balance (no noticeable difference in HR from supine during transfer - Pt A symptomatic). Pt able to complete grooming tasks with set up in chair. With increased falls in home environment - at this time recommending [place] for home safety evaluation and to assess function in home environment. Next session to focus on energy conservation and tub transfer.   Pt also shared that she feels very isolated at home- that she misses getting out and having friends to spend time with - she doesn't drive and she's "not allowed" to cook or bake anymore - activities that she finds rewarding and meaningful. Would [facility] be a good option for her?    Follow Up Recommendations  Home health OT;Supervision/Assistance - 24 hour    Equipment Recommendations  None recommended by OT(Pt has appropriate DME)    Recommendations for Other Services       Precautions / Restrictions Precautions Precautions: Fall;Other (comment)(watch HR) Restrictions Weight Bearing Restrictions: No      Mobility Bed Mobility Overal bed mobility: Needs Assistance Bed Mobility: Supine to Sit     Supine to sit: Min assist     General bed mobility comments: min A for trunk elevation, Pt used bed rails to assist  Transfers Overall transfer level: Needs assistance Equipment used:  1 person hand held assist Transfers: Sit to/from [facility] Sit to Stand: Min assist Stand pivot transfers: Min assist       General transfer comment: hands on for safety and balance    Balance Overall balance assessment: Needs assistance Sitting-balance support: No upper extremity supported;Feet supported Sitting balance-Leahy Scale: Good     Standing balance support: Single extremity supported Standing balance-Leahy Scale: Fair Standing balance comment: Pt able to demonstrate static standing with SUE support                           ADL either performed or assessed with clinical judgement   ADL Overall ADL's : Needs assistance/impaired Eating/Feeding: Independent;Sitting   Grooming: Oral care;Set up;Sitting Grooming Details (indicate cue type and reason): perfomed in recliner due to HR Upper Body Bathing: Sitting;Supervision/ safety   Lower Body Bathing: Sitting/lateral leans;Supervison/ safety   Upper Body Dressing : Set up   Lower Body Dressing: Set up;Sitting/lateral leans Lower Body Dressing Details (indicate cue type and reason): able to don/doff socks without assist Toilet Transfer: Minimal assistance;Stand-pivot Toilet Transfer Details (indicate cue type and reason): stand pivot transfer to recliner simulating toilet transfer Toileting- Clothing Manipulation and Hygiene: Minimal assistance;Sit to/from stand Toileting - Clothing Manipulation Details (indicate cue type and reason): min A for balance     Functional mobility during ADLs: Minimal assistance(HHA) General ADL Comments: Pt with frequent falls, decreased balance, decreased safety awareness     Vision Baseline Vision/History: Wears glasses Patient Visual Report: No change from baseline       Perception  Praxis      Pertinent Vitals/Pain Pain Assessment: 0-10 Pain Score: 2  Pain Location: lower back (chronic) Pain Descriptors / Indicators: Aching Pain  Intervention(s): Repositioned     Hand Dominance Right   Extremity/Trunk Assessment Upper Extremity Assessment Upper Extremity Assessment: Generalized weakness   Lower Extremity Assessment Lower Extremity Assessment: Defer to PT evaluation;Generalized weakness   Cervical / Trunk Assessment Cervical / Trunk Assessment: Kyphotic   Communication Communication Communication: No difficulties   Cognition Arousal/Alertness: Awake/alert Behavior During Therapy: WFL for tasks assessed/performed Overall Cognitive Status: No family/caregiver present to determine baseline cognitive functioning                                 General Comments: Pt with tangetial story telling throughout session. Assume Pt is reliable historian - but no family present to confirm   General Comments  Pt with HR that consitently ranged from 115-145 throughout session. One episode of Vtach. Pt Asymptomatic throughout.    Exercises     Shoulder Instructions      Home Living Family/patient expects to be discharged to:: Private residence Living Arrangements: Children Available Help at Discharge: Family;Available 24 hours/day Type of Home: House       Home Layout: Able to live on main level with bedroom/bathroom;Multi-level     Bathroom Shower/Tub: Teacher, early years/pre: Standard Bathroom Accessibility: Yes How Accessible: Accessible via walker Home Equipment: Walker - 4 wheels;Hand held shower head          Prior Functioning/Environment Level of Independence: Independent with assistive device(s)        Comments: uses a Rollator at baseline, independent in ADL - does not perform IADL and does not drive anymore        OT Problem List: Decreased activity tolerance;Impaired balance (sitting and/or standing);Decreased safety awareness;Cardiopulmonary status limiting activity      OT Treatment/Interventions: Self-care/ADL training;Energy conservation;Therapeutic  activities;Patient/family education;Balance training    OT Goals(Current goals can be found in the care plan section) Acute Rehab OT Goals Patient Stated Goal: "get out more, I am lonely" OT Goal Formulation: With patient Time For Goal Achievement: 06/25/18 Potential to Achieve Goals: Good  OT Frequency: Min 2X/week   Barriers to D/C:            Co-evaluation              AM-PAC OT "6 Clicks" Daily Activity     Outcome Measure Help from another person eating meals?: None Help from another person taking care of personal grooming?: A Little Help from another person toileting, which includes using toliet, bedpan, or urinal?: A Little Help from another person bathing (including washing, rinsing, drying)?: A Little Help from another person to put on and taking off regular upper body clothing?: None Help from another person to put on and taking off regular lower body clothing?: A Little 6 Click Score: 20   End of Session Equipment Utilized During Treatment: Gait belt Nurse Communication: Mobility status;Other (comment)(HR, Vtach episode, pure wick removed)  Activity Tolerance: Patient tolerated treatment well(limited by overall increased HR) Patient left: in chair;with call bell/phone within reach;with chair alarm set;Other (comment)(MD present)  OT Visit Diagnosis: Unsteadiness on feet (R26.81);Other abnormalities of gait and mobility (R26.89);Muscle weakness (generalized) (M62.81);History of falling (Z91.81);Repeated falls (R29.6)                Time: 0623-7628 OT Time Calculation (min): 30 min Charges:  OT General Charges $OT Visit: 1 Visit OT Evaluation $OT Eval Moderate Complexity: 1 Mod OT Treatments $Self Care/Home Management : 8-22 mins  Hulda Humphrey OTR/L Acute Rehabilitation Services Pager: [phone] Office: [phone]  Merri Ray Christien Berthelot 06/11/2018, 9:58 AM

## 2018-06-12 ENCOUNTER — Observation Stay (HOSPITAL_BASED_OUTPATIENT_CLINIC_OR_DEPARTMENT_OTHER): Payer: Medicare Other

## 2018-06-12 DIAGNOSIS — I361 Nonrheumatic tricuspid (valve) insufficiency: Secondary | ICD-10-CM | POA: Diagnosis not present

## 2018-06-12 DIAGNOSIS — I959 Hypotension, unspecified: Secondary | ICD-10-CM | POA: Diagnosis not present

## 2018-06-12 DIAGNOSIS — I4891 Unspecified atrial fibrillation: Secondary | ICD-10-CM | POA: Diagnosis not present

## 2018-06-12 DIAGNOSIS — N184 Chronic kidney disease, stage 4 (severe): Secondary | ICD-10-CM | POA: Diagnosis not present

## 2018-06-12 DIAGNOSIS — I5032 Chronic diastolic (congestive) heart failure: Secondary | ICD-10-CM | POA: Diagnosis not present

## 2018-06-12 LAB — CBC
HCT: 28.9 % — ABNORMAL LOW (ref 36.0–46.0)
Hemoglobin: 8.8 g/dL — ABNORMAL LOW (ref 12.0–15.0)
MCH: 28.7 pg (ref 26.0–34.0)
MCHC: 30.4 g/dL (ref 30.0–36.0)
MCV: 94.1 fL (ref 80.0–100.0)
NRBC: 0 % (ref 0.0–0.2)
Platelets: 188 10*3/uL (ref 150–400)
RBC: 3.07 MIL/uL — ABNORMAL LOW (ref 3.87–5.11)
RDW: 16.6 % — ABNORMAL HIGH (ref 11.5–15.5)
WBC: 7 10*3/uL (ref 4.0–10.5)

## 2018-06-12 LAB — BASIC METABOLIC PANEL
ANION GAP: 11 (ref 5–15)
BUN: 49 mg/dL — ABNORMAL HIGH (ref 8–23)
CO2: 22 mmol/L (ref 22–32)
Calcium: 8.1 mg/dL — ABNORMAL LOW (ref 8.9–10.3)
Chloride: 101 mmol/L (ref 98–111)
Creatinine, Ser: 1.86 mg/dL — ABNORMAL HIGH (ref 0.44–1.00)
GFR calc Af Amer: 28 mL/min — ABNORMAL LOW (ref >60)
GFR calc non Af Amer: 24 mL/min — ABNORMAL LOW (ref >60)
Glucose, Bld: 117 mg/dL — ABNORMAL HIGH (ref 70–99)
POTASSIUM: 3.7 mmol/L (ref 3.5–5.1)
Sodium: 134 mmol/L — ABNORMAL LOW (ref 135–145)

## 2018-06-12 LAB — ECHOCARDIOGRAM COMPLETE
Height: 62 in
Weight: 2328 oz

## 2018-06-12 LAB — GLUCOSE, CAPILLARY
GLUCOSE-CAPILLARY: 113 mg/dL — AB (ref 70–99)
GLUCOSE-CAPILLARY: 117 mg/dL — AB (ref 70–99)
Glucose-Capillary: 134 mg/dL — ABNORMAL HIGH (ref 70–99)
Glucose-Capillary: 222 mg/dL — ABNORMAL HIGH (ref 70–99)

## 2018-06-12 MED ORDER — AMIODARONE HCL 200 MG PO TABS
200.0000 mg | ORAL_TABLET | Freq: Two times a day (BID) | ORAL | Status: DC
  Administered 2018-06-12 – 2018-06-20 (×17): 200 mg via ORAL
  Filled 2018-06-12 (×17): qty 1

## 2018-06-12 NOTE — Plan of Care (Signed)
  Problem: Cardiac: °Goal: Ability to achieve and maintain adequate cardiopulmonary perfusion will improve °Outcome: Progressing °  °Problem: Clinical Measurements: °Goal: Ability to maintain clinical measurements within normal limits will improve °Outcome: Progressing °  °Problem: Clinical Measurements: °Goal: Cardiovascular complication will be avoided °Outcome: Progressing °  °

## 2018-06-12 NOTE — Progress Notes (Signed)
PROGRESS NOTE  Madison Riggs BHA:[id number] DOB: Apr 29, 1928 DOA: 06/09/2018 PCP: Rolene Course, PA-C   LOS: 0 days   Brief Narrative / Interim history: 84 year old female with history of hypertension, hyperlipidemia, diet-controlled diabetes mellitus, hypothyroidism, chronic kidney disease stage IV, sleep apnea on CPAP, diastolic CHF who was admitted to the hospital after having a fall.  She has also been complaining of intermittent shortness of breath, and also reports intermittent rectal bleeding for "quite some time".  She was found to have new onset of A. fib with RVR with worsening anemia with a hemoglobin of 7.0, and worsening creatinine.  She was admitted to the hospitalist service  Subjective: -No chest pain, no palpitation.  Feels a little bit better.  Worked with PT yesterday and got lightheaded  Assessment & Plan: Principal Problem:   Atrial fibrillation with RVR ([place]) Active Problems:   Iron deficiency anemia   Hypertension   Type II diabetes mellitus with renal manifestations (HCC)   Chronic diastolic HF (heart failure) (HCC)   CKD (chronic kidney disease), stage IV ([place])   New onset atrial fibrillation (HCC)   HLD (hyperlipidemia)   GERD (gastroesophageal reflux disease)   Hypothyroidism   Fall   Positive occult stool blood test   Principal Problem New onset A. fib with RVR -Cardiology consulted, appreciate input. Soft blood pressure limits beta-blocker and calcium channel blocker, started on IV amiodarone 12/28, rate improved on telemetry, changed to p.o. amiodarone today -2D echo initially attempted but heart rate too high, repeat study pending as her heart rate is now improved  Additional Problems Falls -PT recommending home health PT, will arrange prior to discharge  Iron deficiency anemia/positive fecal occult -Discussed with Dr. Paulita Fujita, for now conservative management -Not a candidate for anticoagulation for A. fib -Status post 2 units of packed red  blood cells on 12/27, hemoglobin now improved but slightly downtrending today, will continue to monitor with daily CBCs  Hypothyroidism -TSH within normal limits at 2.5, continue levothyroxine  Acute kidney injury on chronic kidney disease -Creatinine 1.86 on admission, baseline around 1.4, initially improved to 1.6 however slightly higher today at 1.86, suspect in the setting of intermittent hypotension  Hypertension -For now hold antihypertensives as she is hypotensive  Type 2 diabetes mellitus, diet controlled -Glucose fairly controlled on BMP this morning, fasting 973  Chronic diastolic CHF -2D echo in 5329 showed grade 2 diastolic dysfunction with normal EF -Repeat echo pending  Hyperlipidemia -Continue home medications  Scheduled Meds: . allopurinol  100 mg Oral BID  . amiodarone  200 mg Oral BID  . calcium carbonate  1 tablet Oral Q breakfast  . ferrous sulfate  325 mg Oral Daily  . fluticasone  1 spray Each Nare Daily  . lactobacillus acidophilus & bulgar  1 tablet Oral Daily  . levothyroxine  100 mcg Oral Q0600  . loratadine  10 mg Oral Daily  . magnesium oxide  400 mg Oral Daily  . multivitamin with minerals  1 tablet Oral Daily  . pantoprazole  40 mg Oral BID  . senna-docusate  1 tablet Oral QHS  . simvastatin  20 mg Oral Daily  . traZODone  100 mg Oral QHS  . venlafaxine XR  75 mg Oral Daily  . vitamin B-12  5,000 mcg Oral Daily   Continuous Infusions:  PRN Meds:.acetaminophen **OR** acetaminophen, hydrALAZINE, hydrOXYzine, zolpidem  DVT prophylaxis: SCDs Code Status: Full code Family Communication: No family present at bedside Disposition Plan: Home versus SNF when ready  Consultants:   Cardiology  Gastroenterology  Procedures:   None   Antimicrobials:  None    Objective: Vitals:   06/12/18 0700 06/12/18 0740 06/12/18 0745 06/12/18 1124  BP: (!) 82/57 (!) 86/58 94/60 103/83  Pulse: 100 93  95  Resp: (!) 26 20  (!) 23  Temp: 98.9  F (37.2 C) 98.2 F (36.8 C)  97.8 F (36.6 C)  TempSrc: Oral Oral  Oral  SpO2: 96% 98%  98%  Weight:      Height:        Intake/Output Summary (Last 24 hours) at 06/12/2018 1239 Last data filed at 06/12/2018 1129 Gross per 24 hour  Intake 781 ml  Output 200 ml  Net 581 ml   Filed Weights   06/09/18 2229 06/10/18 0500 06/11/18 0437  Weight: 61.2 kg 66.4 kg 66 kg    Examination:  Constitutional: No distress Eyes: No scleral icterus ENMT: Moist mucous membranes Neck: normal, supple Respiratory: No wheezing or crackles heard, moves air well Cardiovascular: Irregularly irregular, 3/6 SEM, tachycardia improved.  No peripheral edema Abdomen: Soft, nontender, nondistended, bowel sounds positive Musculoskeletal: no clubbing / cyanosis.  Skin: No rashes seen Neurologic: Nonfocal, equal strength Psychiatric: Normal judgment and insight. Alert and oriented x 3. Normal mood.    Data Reviewed: I have independently reviewed following labs and imaging studies   CBC: Recent Labs  Lab 06/10/18 0734 06/10/18 1411 06/10/18 1926 06/11/18 0332 06/12/18 0507  WBC 6.9 6.5 7.5 7.5 7.0  HGB 8.0* 9.4* 9.6* 9.3* 8.8*  HCT 25.7* 29.4* 30.2* 29.6* 28.9*  MCV 97.3 93.3 93.5 94.0 94.1  PLT 200 193 208 194 858   Basic Metabolic Panel: Recent Labs  Lab 06/09/18 2238 06/10/18 0203 06/10/18 0734 06/11/18 0332 06/12/18 0507  NA 140  --  141 138 134*  K 4.1  --  3.9 3.9 3.7  CL 104  --  104 104 101  CO2 32  --  24 24 22   GLUCOSE 141*  --  111* 111* 117*  BUN 50*  --  47* 48* 49*  CREATININE 1.86*  --  1.68* 1.71* 1.86*  CALCIUM 8.3*  --  8.0* 8.1* 8.1*  MG  --  2.5*  --   --   --    GFR: Estimated Creatinine Clearance: 18.6 mL/min (A) (by C-G formula based on SCr of 1.86 mg/dL (H)). Liver Function Tests: Recent Labs  Lab 06/09/18 2242  AST 34  ALT 21  ALKPHOS 85  BILITOT 0.5  PROT 6.5  ALBUMIN 2.5*   No results for input(s): LIPASE, AMYLASE in the last 168 hours. No  results for input(s): AMMONIA in the last 168 hours. Coagulation Profile: No results for input(s): INR, PROTIME in the last 168 hours. Cardiac Enzymes: Recent Labs  Lab 06/10/18 0203 06/10/18 0734 06/10/18 1411  TROPONINI <0.03 <0.03 <0.03   BNP (last 3 results) No results for input(s): PROBNP in the last 8760 hours. HbA1C: No results for input(s): HGBA1C in the last 72 hours. CBG: Recent Labs  Lab 06/09/18 2227 06/12/18 0737 06/12/18 1122  GLUCAP 137* 134* 113*   Lipid Profile: No results for input(s): CHOL, HDL, LDLCALC, TRIG, CHOLHDL, LDLDIRECT in the last 72 hours. Thyroid Function Tests: Recent Labs    06/10/18 0203  TSH 2.504   Anemia Panel: Recent Labs    06/10/18 0013  VITAMINB12 6,913*  FOLATE 13.9  FERRITIN 73  TIBC 273  IRON 23*  RETICCTPCT 3.0   Urine analysis:  Component Value Date/Time   COLORURINE YELLOW 06/10/2018 1416   APPEARANCEUR CLEAR 06/10/2018 1416   LABSPEC 1.015 06/10/2018 1416   PHURINE 5.0 06/10/2018 1416   GLUCOSEU NEGATIVE 06/10/2018 1416   HGBUR NEGATIVE 06/10/2018 1416   BILIRUBINUR NEGATIVE 06/10/2018 1416   KETONESUR NEGATIVE 06/10/2018 1416   PROTEINUR NEGATIVE 06/10/2018 1416   NITRITE NEGATIVE 06/10/2018 1416   LEUKOCYTESUR NEGATIVE 06/10/2018 1416   Sepsis Labs: Invalid input(s): PROCALCITONIN, LACTICIDVEN  No results found for this or any previous visit (from the past 240 hour(s)).    Radiology Studies: No results found.   Marzetta Board, MD, PhD [facility] Pager [phone]  If 7PM-7AM, please contact night-coverage [email or web address] Password [facility] 06/12/2018, 12:39 PM

## 2018-06-12 NOTE — Progress Notes (Signed)
Patient's blood pressure in right arm 83/50, MAP 61.  Blood pressure in left arm 82/56, MAP 65.  Patient laying in bed asymptomatic.  Call order for systolic blood pressure less than 90 and diastolic less than 60.  RN text paged [facility] with this information.

## 2018-06-12 NOTE — Progress Notes (Signed)
Progress Note  Patient Name: Madison Riggs Date of Encounter: 06/12/2018  Primary Cardiologist: Minus Breeding, MD   Subjective   Patient admitted after suffering a fall and was found to be in atrial fibrillation with RVR and found to have a hemoglobin of 7.0. She has received PRBCs.  She denies any chest pain but did have some shortness of breath last night  Inpatient Medications    Scheduled Meds: . allopurinol  100 mg Oral BID  . calcium carbonate  1 tablet Oral Q breakfast  . ferrous sulfate  325 mg Oral Daily  . fluticasone  1 spray Each Nare Daily  . lactobacillus acidophilus & bulgar  1 tablet Oral Daily  . levothyroxine  100 mcg Oral Q0600  . loratadine  10 mg Oral Daily  . magnesium oxide  400 mg Oral Daily  . multivitamin with minerals  1 tablet Oral Daily  . pantoprazole  40 mg Oral BID  . senna-docusate  1 tablet Oral QHS  . simvastatin  20 mg Oral Daily  . traZODone  100 mg Oral QHS  . venlafaxine XR  75 mg Oral Daily  . vitamin B-12  5,000 mcg Oral Daily   Continuous Infusions: . amiodarone 30 mg/hr (06/12/18 0012)   PRN Meds: acetaminophen **OR** acetaminophen, hydrALAZINE, hydrOXYzine, zolpidem   Vital Signs    Vitals:   06/11/18 2054 06/12/18 0700 06/12/18 0740 06/12/18 0745  BP: 106/68 (!) 82/57 (!) 86/58 94/60  Pulse: (!) 102 100 93   Resp: 20 (!) 26 20   Temp: 99.2 F (37.3 C) 98.9 F (37.2 C) 98.2 F (36.8 C)   TempSrc: Oral Oral Oral   SpO2: 96% 96% 98%   Weight:      Height:        Intake/Output Summary (Last 24 hours) at 06/12/2018 0945 Last data filed at 06/12/2018 0451 Gross per 24 hour  Intake 679.86 ml  Output 900 ml  Net -220.14 ml   Filed Weights   06/09/18 2229 06/10/18 0500 06/11/18 0437  Weight: 61.2 kg 66.4 kg 66 kg    Telemetry    Atrial fibrillation with controlled ventricular response with heart rate 90 bpm- Personally Reviewed  ECG    Atrial fibrillation with controlled ventricular response at 90 bpm  with low voltage QRS and nonspecific ST-T wave abnormality- Personally Reviewed  Physical Exam   GEN: Well nourished, well developed in no acute distress HEENT: Normal NECK: No JVD; No carotid bruits LYMPHATICS: No lymphadenopathy CARDIAC: Irregularly irregular no murmurs, rubs, gallops RESPIRATORY:  Clear to auscultation without rales, wheezing or rhonchi  ABDOMEN: Soft, non-tender, non-distended MUSCULOSKELETAL:  No edema; No deformity  SKIN: Warm and dry NEUROLOGIC:  Alert and oriented x 3 PSYCHIATRIC:  Normal affect    Labs    Chemistry Recent Labs  Lab 06/09/18 2242 06/10/18 0734 06/11/18 0332 06/12/18 0507  NA  --  141 138 134*  K  --  3.9 3.9 3.7  CL  --  104 104 101  CO2  --  24 24 22   GLUCOSE  --  111* 111* 117*  BUN  --  47* 48* 49*  CREATININE  --  1.68* 1.71* 1.86*  CALCIUM  --  8.0* 8.1* 8.1*  PROT 6.5  --   --   --   ALBUMIN 2.5*  --   --   --   AST 34  --   --   --   ALT 21  --   --   --  ALKPHOS 85  --   --   --   BILITOT 0.5  --   --   --   GFRNONAA  --  27* 26* 24*  GFRAA  --  31* 31* 28*  ANIONGAP  --  13 10 11      Hematology Recent Labs  Lab 06/10/18 1926 06/11/18 0332 06/12/18 0507  WBC 7.5 7.5 7.0  RBC 3.23* 3.15* 3.07*  HGB 9.6* 9.3* 8.8*  HCT 30.2* 29.6* 28.9*  MCV 93.5 94.0 94.1  MCH 29.7 29.5 28.7  MCHC 31.8 31.4 30.4  RDW 17.5* 17.4* 16.6*  PLT 208 194 188    Cardiac Enzymes Recent Labs  Lab 06/10/18 0203 06/10/18 0734 06/10/18 1411  TROPONINI <0.03 <0.03 <0.03    Recent Labs  Lab 06/09/18 2244  TROPIPOC 0.01     BNP Recent Labs  Lab 06/10/18 0203  BNP 378.9*     DDimer No results for input(s): DDIMER in the last 168 hours.   Radiology    No results found.  Cardiac Studies   Echo pending.  Patient Profile   Madison Riggs is a 84 y.o. female with a history of diastolic congestive heart failure, aortic stenosis, hypertension, hyperlipidemia, diet-controlled diabetes, obstructive sleep apnea on CPAP,  GERD, hypothyroidism, and CKD stage IV who presented to [facility] ED on 06/09/2018 after suffering a fall. Cardiology was consulted for evaluation of atrial fibrillation with RVR.   Assessment & Plan    New Onset Atrial Fibrillation with RVR - Patient present to the ED after falling and was found to be in atrial fibrillation with RVR. Also found to have a hemoglobin of 7. - Anticoagulation not initiated due to patient's hemoglobin.   -TRH feels patient not a candidate for long-term anticoagulation due to frequent falls and chronic GI bleeding - AV nodal agents have not been initiated due to patient's hypotension which has been persistent with SBP in the upper 80's to low 90's -Heart rate much improved with the addition of IV amiodarone. -We will change IV amio to p.o. amiodarone 200 mg twice daily -QTC on EKG today 366 ms  -Continue to follow QTC on EKG daily -2D echo pending  Iron Deficiency Anemia - Patient does have chronic anemia with baseline hemoglobin appearing to be in the 9 to 10 range. However, patient presented to the ED with hemoglobin of 7, which then dropped to 6.8. - Patient may have slow, chronic GI bleed. She reports dark blood on toilet paper after having bowel movements. - Patient has received 2 units PRBCs - Hbg dropped from 9.3-8.8 today - Per primary team and GI.  Chronic Diastolic CHF - Most recent Echo from 10/2016 showed LVEF of 60-65% with hypokinesis of the mid-apicalanteroseptal myocardium and grade 2 diastolic dysfunction. - LE edema has improved and no crackles on lung exam today - Torsemide on hold at this time due to hypotension. - Echo pending. - Will continue to monitor volume status.   For questions or updates, please contact [place] Please consult [email or web address] for contact info under        Signed, Fransico Him, MD  06/12/2018, 9:45 AM

## 2018-06-12 NOTE — Progress Notes (Signed)
  Echocardiogram 2D Echocardiogram has been performed.  Darlina Sicilian M 06/12/2018, 9:31 AM

## 2018-06-13 ENCOUNTER — Other Ambulatory Visit: Payer: Self-pay

## 2018-06-13 DIAGNOSIS — I4891 Unspecified atrial fibrillation: Secondary | ICD-10-CM | POA: Diagnosis not present

## 2018-06-13 DIAGNOSIS — K5900 Constipation, unspecified: Secondary | ICD-10-CM | POA: Diagnosis present

## 2018-06-13 DIAGNOSIS — E7800 Pure hypercholesterolemia, unspecified: Secondary | ICD-10-CM | POA: Diagnosis present

## 2018-06-13 DIAGNOSIS — E785 Hyperlipidemia, unspecified: Secondary | ICD-10-CM | POA: Diagnosis present

## 2018-06-13 DIAGNOSIS — D509 Iron deficiency anemia, unspecified: Secondary | ICD-10-CM | POA: Diagnosis present

## 2018-06-13 DIAGNOSIS — Z9884 Bariatric surgery status: Secondary | ICD-10-CM | POA: Diagnosis not present

## 2018-06-13 DIAGNOSIS — R296 Repeated falls: Secondary | ICD-10-CM | POA: Diagnosis present

## 2018-06-13 DIAGNOSIS — E039 Hypothyroidism, unspecified: Secondary | ICD-10-CM | POA: Diagnosis present

## 2018-06-13 DIAGNOSIS — R188 Other ascites: Secondary | ICD-10-CM | POA: Diagnosis present

## 2018-06-13 DIAGNOSIS — Z9049 Acquired absence of other specified parts of digestive tract: Secondary | ICD-10-CM | POA: Diagnosis not present

## 2018-06-13 DIAGNOSIS — Z7952 Long term (current) use of systemic steroids: Secondary | ICD-10-CM | POA: Diagnosis not present

## 2018-06-13 DIAGNOSIS — I130 Hypertensive heart and chronic kidney disease with heart failure and stage 1 through stage 4 chronic kidney disease, or unspecified chronic kidney disease: Secondary | ICD-10-CM | POA: Diagnosis present

## 2018-06-13 DIAGNOSIS — E1122 Type 2 diabetes mellitus with diabetic chronic kidney disease: Secondary | ICD-10-CM | POA: Diagnosis present

## 2018-06-13 DIAGNOSIS — N184 Chronic kidney disease, stage 4 (severe): Secondary | ICD-10-CM | POA: Diagnosis present

## 2018-06-13 DIAGNOSIS — R32 Unspecified urinary incontinence: Secondary | ICD-10-CM | POA: Diagnosis present

## 2018-06-13 DIAGNOSIS — F0390 Unspecified dementia without behavioral disturbance: Secondary | ICD-10-CM | POA: Diagnosis present

## 2018-06-13 DIAGNOSIS — Z7951 Long term (current) use of inhaled steroids: Secondary | ICD-10-CM | POA: Diagnosis not present

## 2018-06-13 DIAGNOSIS — I5032 Chronic diastolic (congestive) heart failure: Secondary | ICD-10-CM | POA: Diagnosis not present

## 2018-06-13 DIAGNOSIS — Z79890 Hormone replacement therapy (postmenopausal): Secondary | ICD-10-CM | POA: Diagnosis not present

## 2018-06-13 DIAGNOSIS — I480 Paroxysmal atrial fibrillation: Secondary | ICD-10-CM | POA: Diagnosis present

## 2018-06-13 DIAGNOSIS — I5033 Acute on chronic diastolic (congestive) heart failure: Secondary | ICD-10-CM | POA: Diagnosis present

## 2018-06-13 DIAGNOSIS — K210 Gastro-esophageal reflux disease with esophagitis: Secondary | ICD-10-CM | POA: Diagnosis present

## 2018-06-13 DIAGNOSIS — D508 Other iron deficiency anemias: Secondary | ICD-10-CM | POA: Diagnosis not present

## 2018-06-13 DIAGNOSIS — I959 Hypotension, unspecified: Secondary | ICD-10-CM | POA: Diagnosis not present

## 2018-06-13 DIAGNOSIS — N179 Acute kidney failure, unspecified: Secondary | ICD-10-CM | POA: Diagnosis present

## 2018-06-13 DIAGNOSIS — K921 Melena: Secondary | ICD-10-CM | POA: Diagnosis present

## 2018-06-13 DIAGNOSIS — I350 Nonrheumatic aortic (valve) stenosis: Secondary | ICD-10-CM | POA: Diagnosis present

## 2018-06-13 DIAGNOSIS — Z79899 Other long term (current) drug therapy: Secondary | ICD-10-CM | POA: Diagnosis not present

## 2018-06-13 LAB — BASIC METABOLIC PANEL
Anion gap: 9 (ref 5–15)
BUN: 51 mg/dL — ABNORMAL HIGH (ref 8–23)
CALCIUM: 7.9 mg/dL — AB (ref 8.9–10.3)
CO2: 24 mmol/L (ref 22–32)
Chloride: 102 mmol/L (ref 98–111)
Creatinine, Ser: 1.84 mg/dL — ABNORMAL HIGH (ref 0.44–1.00)
GFR calc non Af Amer: 24 mL/min — ABNORMAL LOW (ref >60)
GFR, EST AFRICAN AMERICAN: 28 mL/min — AB (ref >60)
Glucose, Bld: 113 mg/dL — ABNORMAL HIGH (ref 70–99)
Potassium: 3.7 mmol/L (ref 3.5–5.1)
Sodium: 135 mmol/L (ref 135–145)

## 2018-06-13 LAB — CBC
HCT: 28.1 % — ABNORMAL LOW (ref 36.0–46.0)
Hemoglobin: 8.8 g/dL — ABNORMAL LOW (ref 12.0–15.0)
MCH: 29.7 pg (ref 26.0–34.0)
MCHC: 31.3 g/dL (ref 30.0–36.0)
MCV: 94.9 fL (ref 80.0–100.0)
NRBC: 0 % (ref 0.0–0.2)
PLATELETS: 194 10*3/uL (ref 150–400)
RBC: 2.96 MIL/uL — ABNORMAL LOW (ref 3.87–5.11)
RDW: 16.5 % — ABNORMAL HIGH (ref 11.5–15.5)
WBC: 7.3 10*3/uL (ref 4.0–10.5)

## 2018-06-13 LAB — GLUCOSE, CAPILLARY
Glucose-Capillary: 112 mg/dL — ABNORMAL HIGH (ref 70–99)
Glucose-Capillary: 168 mg/dL — ABNORMAL HIGH (ref 70–99)

## 2018-06-13 NOTE — Progress Notes (Signed)
Physical Therapy Treatment Patient Details Name: Madison Riggs MRN: [id number] DOB: [birth year] Today's Date: 06/13/2018    History of Present Illness Madison Riggs is a 84 y.o. female with medical history significant of hypertension, hyperlipidemia, diet-controlled diabetes, GERD, hypothyroidism, gout, aortic stenosis, CKD 4, OSA on CPAP, iron deficiency anemia, mild dementia, dCHF, gastric bypass surgery, presents with fall.    PT Comments    Patient seen for mobility progression. Pt requires min guard overall for mobility this session and reliant on bilat UE support while ambulating. Pt denies dizziness while ambulating however presents with SOB and elevated HR and RR ( see general comments below). Pt will need supervision/assistance for OOB mobility and will continue to benefit from further skilled PT services to maximize independence and safety with mobility.      Follow Up Recommendations  Home health PT;Supervision for mobility/OOB     Equipment Recommendations  None recommended by PT    Recommendations for Other Services       Precautions / Restrictions Precautions Precautions: Fall;Other (comment) Precaution Comments: watch HR    Mobility  Bed Mobility Overal bed mobility: Needs Assistance Bed Mobility: Supine to Sit     Supine to sit: HOB elevated;Min guard     General bed mobility comments: use of rails and min guard for safety; pt reports having rails on her bed at home  Transfers Overall transfer level: Needs assistance Equipment used: 4-wheeled walker Transfers: Sit to/from Stand Sit to Stand: Min guard         General transfer comment: increased time and effort; cues for positioning prior to attempt to stand; min guard for safety   Ambulation/Gait Ambulation/Gait assistance: Min guard Gait Distance (Feet): 150 Feet Assistive device: 4-wheeled walker Gait Pattern/deviations: Step-through pattern;Decreased stride length Gait velocity:  decreased   General Gait Details: min guard for safety; decreased cadence and reliant on bilat UE support for balance; pt denies dizziness while ambulating but presents with SOB, RR in 30s, SpO2 91% and HR noted up to 130 bpm; frequent standing rest breaks due to SOB and elevated HR   Stairs             Wheelchair Mobility    Modified Rankin (Stroke Patients Only)       Balance Overall balance assessment: Needs assistance Sitting-balance support: No upper extremity supported;Feet supported Sitting balance-Leahy Scale: Good     Standing balance support: During functional activity;Bilateral upper extremity supported Standing balance-Leahy Scale: Poor                              Cognition Arousal/Alertness: Awake/alert Behavior During Therapy: WFL for tasks assessed/performed Overall Cognitive Status: Within Functional Limits for tasks assessed                                        Exercises      General Comments General comments (skin integrity, edema, etc.): HR fluctuating from 97 bpm to 130 bpm during session. At rest end of session HR 105 bpm, RR 20, and BP 98/57 (70)      Pertinent Vitals/Pain Pain Assessment: Faces Faces Pain Scale: Hurts a little bit Pain Location: low back (chronic) Pain Descriptors / Indicators: Sore Pain Intervention(s): Limited activity within patient's tolerance;Monitored during session;Repositioned    Home Living  Prior Function            PT Goals (current goals can now be found in the care plan section) Acute Rehab PT Goals Patient Stated Goal: feel better Progress towards PT goals: Progressing toward goals    Frequency    Min 3X/week      PT Plan Current plan remains appropriate    Co-evaluation              AM-PAC PT "6 Clicks" Mobility   Outcome Measure  Help needed turning from your back to your side while in a flat bed without using bedrails?:  A Little Help needed moving from lying on your back to sitting on the side of a flat bed without using bedrails?: A Little Help needed moving to and from a bed to a chair (including a wheelchair)?: A Little Help needed standing up from a chair using your arms (e.g., wheelchair or bedside chair)?: A Little Help needed to walk in hospital room?: A Little Help needed climbing 3-5 steps with a railing? : A Little 6 Click Score: 18    End of Session Equipment Utilized During Treatment: Gait belt Activity Tolerance: Patient tolerated treatment well Patient left: with call bell/phone within reach;in chair Nurse Communication: Mobility status PT Visit Diagnosis: Difficulty in walking, not elsewhere classified (R26.2)     Time: 8413-2440 PT Time Calculation (min) (ACUTE ONLY): 29 min  Charges:  $Gait Training: 8-22 mins $Therapeutic Activity: 8-22 mins                     Earney Navy, PTA Acute Rehabilitation Services Pager: [phone] Office: [phone]     Darliss Cheney 06/13/2018, 10:53 AM

## 2018-06-13 NOTE — Progress Notes (Signed)
Occupational Therapy Treatment Patient Details Name: Madison Riggs MRN: [id number] DOB: 28-Jan-1928 Today's Date: 06/13/2018    History of present illness Madison Riggs is a 84 y.o. female with medical history significant of hypertension, hyperlipidemia, diet-controlled diabetes, GERD, hypothyroidism, gout, aortic stenosis, CKD 4, OSA on CPAP, iron deficiency anemia, mild dementia, dCHF, gastric bypass surgery, presents with fall.   OT comments  Pt seen for OT progress session. Pt performing bed mobility with MinA for trunk support. Pt requiring MinA for sit to stands and transfers. Pt with pain in low back. Pt performing transfer to commode and requiring increased time for energy conservation as pt was SOB. SaO2 levels remained  >92% on RA and HR remained 90-119 BPM. Pt performing ADL functional mobility in room with RW and minguard for stability. Pt tolerating session fair. Pt would benefit from continued OT skilled services for ADL, mobility and safety. Rec'd: SNF once medically cleared for d/c as pt has had many recent falls and continues to be fatigued easily upon exertion.   Follow Up Recommendations  Supervision/Assistance - 24 hour;SNF    Equipment Recommendations  None recommended by OT(Pt has appropriate DME)    Recommendations for Other Services      Precautions / Restrictions Precautions Precautions: Fall;Other (comment)(watch HR) Restrictions Weight Bearing Restrictions: No       Mobility Bed Mobility Overal bed mobility: Needs Assistance Bed Mobility: Supine to Sit     Supine to sit: Min assist     General bed mobility comments: min A for trunk stability;Pt used bed rails to assist  Transfers Overall transfer level: Needs assistance Equipment used: Rolling walker (2 wheeled) Transfers: Sit to/from [facility] Sit to Stand: Min assist Stand pivot transfers: Min assist       General transfer comment: hands on for safety and balance     Balance Overall balance assessment: Needs assistance Sitting-balance support: No upper extremity supported;Feet supported Sitting balance-Leahy Scale: Good     Standing balance support: Single extremity supported Standing balance-Leahy Scale: Fair Standing balance comment: Pt able to demonstrate static standing with SUE support                           ADL either performed or assessed with clinical judgement   ADL Overall ADL's : Needs assistance/impaired                         Toilet Transfer: Minimal assistance;Stand-pivot Toilet Transfer Details (indicate cue type and reason): stand pivot transfer to recliner simulating toilet transfer   Toileting - Clothing Manipulation Details (indicate cue type and reason): min A for balance     Functional mobility during ADLs: Minimal assistance(HHA) General ADL Comments: Pt with frequent falls, decreased balance, decreased safety awareness     Vision       Perception     Praxis      Cognition Arousal/Alertness: Awake/alert Behavior During Therapy: WFL for tasks assessed/performed Overall Cognitive Status: No family/caregiver present to determine baseline cognitive functioning                                 General Comments: Pt with tangetial story telling throughout session. Assume Pt is reliable historian - but no family present to confirm        Exercises     Shoulder Instructions  General Comments HR increasing with activity 100-119 BPM    Pertinent Vitals/ Pain       Pain Assessment: Faces Faces Pain Scale: Hurts a little bit Pain Location: lower back (chronic) Pain Descriptors / Indicators: Aching Pain Intervention(s): Limited activity within patient's tolerance;Repositioned  Home Living                                          Prior Functioning/Environment              Frequency  Min 2X/week        Progress Toward Goals  OT  Goals(current goals can now be found in the care plan section)     Acute Rehab OT Goals Patient Stated Goal: "get out more, I am lonely" OT Goal Formulation: With patient Time For Goal Achievement: 06/25/18 Potential to Achieve Goals: Good  Plan Discharge plan needs to be updated    Co-evaluation                 AM-PAC OT "6 Clicks" Daily Activity     Outcome Measure   Help from another person eating meals?: None Help from another person taking care of personal grooming?: A Little Help from another person toileting, which includes using toliet, bedpan, or urinal?: A Little Help from another person bathing (including washing, rinsing, drying)?: A Little Help from another person to put on and taking off regular upper body clothing?: None Help from another person to put on and taking off regular lower body clothing?: A Little 6 Click Score: 20    End of Session Equipment Utilized During Treatment: Gait belt  OT Visit Diagnosis: Unsteadiness on feet (R26.81);Other abnormalities of gait and mobility (R26.89);Muscle weakness (generalized) (M62.81);History of falling (Z91.81);Repeated falls (R29.6)   Activity Tolerance Patient tolerated treatment well(limited by overall increased HR)   Patient Left in chair;with call bell/phone within reach;with chair alarm set;Other (comment)(MD present)   Nurse Communication Mobility status;Other (comment)(HR, Vtach episode, pure wick removed)        Time: 0263-7858 OT Time Calculation (min): 28 min  Charges: OT General Charges $OT Visit: 1 Visit OT Treatments $Self Care/Home Management : 23-37 mins  Ebony Hail Harold Hedge) Marsa Aris OTR/L Acute Rehabilitation Services Pager: [phone] Office: [phone]    Fredda Hammed 06/13/2018, 3:41 PM

## 2018-06-13 NOTE — Clinical Social Work Note (Signed)
Clinical Social Work Assessment  Patient Details  Name: Madison Riggs MRN: [id number] Date of Birth: 10/19/1927  Date of referral:  06/13/18               Reason for consult:  Facility Placement, Discharge Planning                Permission sought to share information with:  Facility Sport and exercise psychologist, Family Supports Permission granted to share information::  Yes, Verbal Permission Granted  Name::     Caitrin Pendergraph  Agency::  SNFs  Relationship::  son  Contact Information:  [phone]  Housing/Transportation Living arrangements for the past 2 months:  Single Family Home Source of Information:  Adult Children Patient Interpreter Needed:  None Criminal Activity/Legal Involvement Pertinent to Current Situation/Hospitalization:  No - Comment as needed Significant Relationships:  Adult Children Lives with:  Adult Children Do you feel safe going back to the place where you live?  Yes Need for family participation in patient care:  Yes (Comment)  Care giving concerns: Patient from home with son and daughter in law. PT recommending home health but family is concerned about patient's multiple falls and would like her to go to rehab at [facility] before coming home.    Social Worker assessment / plan: CSW met with patient and son, [name], at bedside. Patient was alert but did not interact with CSW.  CSW introduced self and role and discussed disposition planning. Son talked about patient's several falls over the past six months. He stated he would like for her to do some rehab at [facility] before coming home. Son indicated patient lives with him and his wife. His wife is retired but they are unable to provide 24 hour care, and also if patient falls, his wife is unable to help lift patient back up.  CSW explained SNF referral and insurance authorization process. Patient will need [facility] authorization prior to admitting to a facility. Facility will initiate auth once identified. CSW sent out initial  referrals, awaiting bed offers. Will provide bed offers and CMS SNF list to family when available. Updated MD. CSW to follow.  Employment status:  Retired Research officer, political party) PT Recommendations:  Home with [place] / Referral to community resources:  [place]  Patient/Family's Response to care: Son appreciative of care.  Patient/Family's Understanding of and Emotional Response to Diagnosis, Current Treatment, and Prognosis: Son with good understanding of patient's condition. Hopeful for SNF instead of home health.  Emotional Assessment Appearance:  Appears stated age Attitude/Demeanor/Rapport:  Unable to Assess(alert but not engaged) Affect (typically observed):  Unable to Assess Orientation:  Oriented to Self, Oriented to Place, Oriented to  Time, Oriented to Situation Alcohol / Substance use:  Not Applicable Psych involvement (Current and /or in the community):  No (Comment)  Discharge Needs  Concerns to be addressed:  Discharge Planning Concerns, Care Coordination Readmission within the last 30 days:  No Current discharge risk:  Physical Impairment Barriers to Discharge:  Continued Medical Work up, [place], [place] 06/13/2018, 12:33 PM

## 2018-06-13 NOTE — Progress Notes (Addendum)
Progress Note  Patient Name: Madison Riggs Date of Encounter: 06/13/2018  Primary Cardiologist: Minus Breeding, MD   Subjective   Madison Riggs was a little short of breath with walking with physical therapy this morning.  She denies any chest pain pressure, lightheadedness or palpitations.  Inpatient Medications    Scheduled Meds: . allopurinol  100 mg Oral BID  . amiodarone  200 mg Oral BID  . calcium carbonate  1 tablet Oral Q breakfast  . ferrous sulfate  325 mg Oral Daily  . fluticasone  1 spray Each Nare Daily  . lactobacillus acidophilus & bulgar  1 tablet Oral Daily  . levothyroxine  100 mcg Oral Q0600  . loratadine  10 mg Oral Daily  . magnesium oxide  400 mg Oral Daily  . multivitamin with minerals  1 tablet Oral Daily  . pantoprazole  40 mg Oral BID  . senna-docusate  1 tablet Oral QHS  . simvastatin  20 mg Oral Daily  . traZODone  100 mg Oral QHS  . venlafaxine XR  75 mg Oral Daily  . vitamin B-12  5,000 mcg Oral Daily   Continuous Infusions:  PRN Meds: acetaminophen **OR** acetaminophen, hydrALAZINE, hydrOXYzine, zolpidem   Vital Signs    Vitals:   06/12/18 2314 06/13/18 0004 06/13/18 0626 06/13/18 0721  BP:  96/60 (!) 94/57 (!) 86/63  Pulse: (!) 104 (!) 102 99 99  Resp: (!) 22 (!) 22 (!) 21   Temp:  98.8 F (37.1 C) 98.3 F (36.8 C) 98.3 F (36.8 C)  TempSrc:  Oral Oral Oral  SpO2: 95% 96% 98% 98%  Weight:   65.4 kg   Height:        Intake/Output Summary (Last 24 hours) at 06/13/2018 0753 Last data filed at 06/13/2018 7616 Gross per 24 hour  Intake 361.14 ml  Output 1025 ml  Net -663.86 ml   Filed Weights   06/10/18 0500 06/11/18 0437 06/13/18 0626  Weight: 66.4 kg 66 kg 65.4 kg    Physical Exam   General: Frail, elderly female, NAD Skin: Warm, dry, intact  Head: Normocephalic, atraumatic, sclera non-icteric, no xanthomas, clear, moist mucus membranes. Neck: Negative for carotid bruits. No JVD Lungs:Clear to ausculation  bilaterally. No wheezes, rales, or rhonchi. Breathing is unlabored. Cardiovascular: Irregularly irregular rhythm, 2/6 systolic murmur heard at the RUSB and LUSB Abdomen: Soft, non-tender, non-distended with normoactive bowel sounds.  MSK: Strength and tone appear normal for age. 5/5 in all extremities Extremities: Trace lower leg and left arm edema. No clubbing or cyanosis. DP/PT pulses 2+ bilaterally  Neuro: Alert and oriented. No focal deficits. No facial asymmetry. MAE spontaneously. Psych: Responds to questions appropriately with normal affect.    Labs    Chemistry Recent Labs  Lab 06/09/18 2242  06/11/18 0332 06/12/18 0507 06/13/18 0423  NA  --    < > 138 134* 135  K  --    < > 3.9 3.7 3.7  CL  --    < > 104 101 102  CO2  --    < > 24 22 24   GLUCOSE  --    < > 111* 117* 113*  BUN  --    < > 48* 49* 51*  CREATININE  --    < > 1.71* 1.86* 1.84*  CALCIUM  --    < > 8.1* 8.1* 7.9*  PROT 6.5  --   --   --   --   ALBUMIN  2.5*  --   --   --   --   AST 34  --   --   --   --   ALT 21  --   --   --   --   ALKPHOS 85  --   --   --   --   BILITOT 0.5  --   --   --   --   GFRNONAA  --    < > 26* 24* 24*  GFRAA  --    < > 31* 28* 28*  ANIONGAP  --    < > 10 11 9    < > = values in this interval not displayed.     Hematology Recent Labs  Lab 06/11/18 0332 06/12/18 0507 06/13/18 0423  WBC 7.5 7.0 7.3  RBC 3.15* 3.07* 2.96*  HGB 9.3* 8.8* 8.8*  HCT 29.6* 28.9* 28.1*  MCV 94.0 94.1 94.9  MCH 29.5 28.7 29.7  MCHC 31.4 30.4 31.3  RDW 17.4* 16.6* 16.5*  PLT 194 188 194    Cardiac Enzymes Recent Labs  Lab 06/10/18 0203 06/10/18 0734 06/10/18 1411  TROPONINI <0.03 <0.03 <0.03    Recent Labs  Lab 06/09/18 2244  TROPIPOC 0.01     BNP Recent Labs  Lab 06/10/18 0203  BNP 378.9*     DDimer No results for input(s): DDIMER in the last 168 hours.   Radiology    No results found.  Telemetry    afib in the 90's-110 - Personally Reviewed  ECG    Atrial  fibrillation at 110 bpm,  QTC 389- Personally Reviewed  Cardiac Studies   Echocardiogram 06/12/2018: Study Conclusions  - Left ventricle: The cavity size was normal. Wall thickness was   normal. Systolic function was vigorous. The estimated ejection   fraction was in the range of 65% to 70%. Wall motion was normal;   there were no regional wall motion abnormalities. The study was   not technically sufficient to allow evaluation of LV diastolic   dysfunction due to atrial fibrillation. Doppler parameters are   consistent with high ventricular filling pressure. - Aortic valve: Valve mobility was restricted. There was mild to   moderate stenosis. Valve area (VTI): 1.04 cm^2. Valve area   (Vmax): 0.95 cm^2. Valve area (Vmean): 0.97 cm^2. - Mitral valve: Severely calcified annulus. Mildly thickened   leaflets . Mild diffuse calcification, with mild involvement of   chords. There was moderate regurgitation directed centrally.   Valve area by continuity equation (using LVOT flow): 1.52 cm^2. - Left atrium: The atrium was mildly dilated. - Right atrium: The atrium was mildly dilated. - Atrial septum: No defect or patent foramen ovale was identified. - Tricuspid valve: There was moderate regurgitation. - Pulmonary arteries: Systolic pressure was mildly increased. PA   peak pressure: 36 mm Hg (S).  Patient Profile     84 y.o. female with a history of diastolic congestive heart failure, aortic stenosis, hypertension, hyperlipidemia, diet-controlled diabetes, obstructive sleep apnea on CPAP, GERD, hypothyroidism, and CKD stage IV who presented to [facility] ED on 06/09/2018 after suffering a fall. Cardiology was consulted for evaluation of atrial fibrillation with RVR.   Assessment & Plan    1.  New onset atrial fibrillation with RVR: -Pt nitially presented after suffering a mechanical fall found to be in atrial fibrillation with RVR and hemoglobin of 7 -Anticoagulation not initiated  secondary to history of recent fall and low hemoglobin -Not currently a candidate for  long-term anticoagulation given the above as well as chronic GI bleeding -Continues to be hypotensive with SBP's in the 80s to 90s, asymptomatic. Unable to tolerate rate lowering medications -Continue p.o. amiodarone 200 mg twice daily -QTC today 318ms, 319ms started 06/12/2017 -Echocardiogram performed 06/13/2017 with LVEF of 65 to 70% with normal wall motion, mild to moderate AS  2. Iron deficiency anemia: -Patient with known chronic anemia s/p gastric bypass surgery many years ago with baseline hemoglobin in the 9.0-10.0 range who presented to the ED with a hemoglobin of 7 which then dropped to 6.8.  She has a history of receiving iron infusions in the past. -Reported dark stools prior to presentation.  Stool positive for blood. -Received 2 units PRBCs per IM, Hemoglobin today, 8.8 -Followed by IM and GI  3.  Chronic diastolic CHF: -Last echocardiogram 10/2016 with LVEF of 60 to 65% with hypokinesis of the mid-apicalanteroseptal myocardium and grade 2 diastolic dysfunction -Home torsemide 40 mg twice daily is on hold due to hypotension -Repeat echocardiogram 06/12/2018 with LVEF 65 to 70% with normal wall motion, improved from prior echocardiogram -Weight, 144lb, wt 135lb presentation (?accuracy) then 146 lbs the next day.  Patient's son reports that normal home weight is 135 pounds. -Patient has trace lower leg and left arm edema.  Lungs clear and no JVD. -I&O, net +562 mL since admission -Patient's son reports that home blood pressures normally range low 90s-low 100s.  Last office visit BP was 114/60.  Once blood pressure stable, resume torsemide.  Daune Perch, AGNP-C [facility] 06/13/2018  10:43 AM Pager: [phone]  History and all data above reviewed.  Patient examined.  I agree with the findings as above.  The patient exam reveals CWU:[id number]  ,  Lungs: Clear  ,  Abd: Positive bowel  sounds, no rebound no guarding, Ext No edema  .  All available labs, radiology testing, previous records reviewed. Agree with documented assessment and plan.   Atrial fib:  Unable to cardiovert mechanically unless she can in the future tolerate anticoagulation.  Therefore, we are pursuing rate control with amiodarone.  Continue current therapy.   Minus Breeding  11:52 AM  06/13/2018  For questions or updates, please contact   Please consult [email or web address] for contact info under Cardiology/STEMI.

## 2018-06-13 NOTE — Progress Notes (Signed)
PROGRESS NOTE  Madison Riggs NWG:[id number] DOB: [birth year] DOA: 06/09/2018 PCP: Rolene Course, PA-C   LOS: 0 days   Brief Narrative / Interim history: 84 year old female with history of hypertension, hyperlipidemia, diet-controlled diabetes mellitus, hypothyroidism, chronic kidney disease stage IV, sleep apnea on CPAP, diastolic CHF who was admitted to the hospital after having a fall.  She has also been complaining of intermittent shortness of breath, and also reports intermittent rectal bleeding for "quite some time".  She was found to have new onset of A. fib with RVR with worsening anemia with a hemoglobin of 7.0, and worsening creatinine.  She was admitted to the hospitalist service  Subjective: -Remains weak this morning but somewhat improved.  Still to walk outside the room.  No chest pain, no palpitation, no abdominal pain, nausea or vomiting  Assessment & Plan: Principal Problem:   Atrial fibrillation with RVR (HCC) Active Problems:   Iron deficiency anemia   Hypertension   Type II diabetes mellitus with renal manifestations (HCC)   Chronic diastolic HF (heart failure) (HCC)   CKD (chronic kidney disease), stage IV (HCC)   New onset atrial fibrillation (HCC)   HLD (hyperlipidemia)   GERD (gastroesophageal reflux disease)   Hypothyroidism   Fall   Positive occult stool blood test   Principal Problem New onset A. fib with RVR -Cardiology consulted, appreciate input. Soft blood pressure limits beta-blocker and calcium channel blocker, started on IV amiodarone 12/28 -2D echo done yesterday, results below -Telemetry reviewed this morning, rates around 100-110 at rest.  Amiodarone has been switched to p.o. on 12/29 per cardiology.  Appreciate cardiology follow-up  Additional Problems Falls -Probably unsafe to go back home given how frail she is especially that she may not have supervision, discussed with Education officer, museum, will pursue SNF  Iron deficiency anemia/positive  fecal occult -Discussed with Dr. Paulita Fujita, for now conservative management -Not a candidate for anticoagulation for A. fib -Status post 2 units of packed red blood cells on 12/27, hemoglobin has overall remained stable  Hypothyroidism -TSH within normal limits at 2.5, continue levothyroxine  Acute kidney injury on chronic kidney disease -Creatinine 1.86 on admission, baseline around 1.4, now stable around 1.8  Hypertension -For now hold antihypertensives as she is hypotensive  Type 2 diabetes mellitus, diet controlled -Fasting 112 this morning, continue to monitor  Chronic diastolic CHF -2D echo in 5784 showed grade 2 diastolic dysfunction with normal EF -Repeat echo as below  Hyperlipidemia -Continue home medications  Scheduled Meds: . allopurinol  100 mg Oral BID  . amiodarone  200 mg Oral BID  . calcium carbonate  1 tablet Oral Q breakfast  . ferrous sulfate  325 mg Oral Daily  . fluticasone  1 spray Each Nare Daily  . lactobacillus acidophilus & bulgar  1 tablet Oral Daily  . levothyroxine  100 mcg Oral Q0600  . loratadine  10 mg Oral Daily  . magnesium oxide  400 mg Oral Daily  . multivitamin with minerals  1 tablet Oral Daily  . pantoprazole  40 mg Oral BID  . senna-docusate  1 tablet Oral QHS  . simvastatin  20 mg Oral Daily  . traZODone  100 mg Oral QHS  . venlafaxine XR  75 mg Oral Daily  . vitamin B-12  5,000 mcg Oral Daily   Continuous Infusions:  PRN Meds:.acetaminophen **OR** acetaminophen, hydrALAZINE, hydrOXYzine, zolpidem  DVT prophylaxis: SCDs Code Status: Full code Family Communication: No family present at bedside Disposition Plan: Home versus SNF when ready  Consultants:   Cardiology  Gastroenterology  Procedures:   2D echo Study Conclusions - Left ventricle: The cavity size was normal. Wall thickness was normal. Systolic function was vigorous. The estimated ejection fraction was in the range of 65% to 70%. Wall motion was normal;  there were no regional wall motion abnormalities. The study was not technically sufficient to allow evaluation of LV diastolic dysfunction due to atrial fibrillation. Doppler parameters are consistent with high ventricular filling pressure. - Aortic valve: Valve mobility was restricted. There was mild to moderate stenosis. Valve area (VTI): 1.04 cm^2. Valve area (Vmax): 0.95 cm^2. Valve area (Vmean): 0.97 cm^2. - Mitral valve: Severely calcified annulus. Mildly thickened leaflets . Mild diffuse calcification, with mild involvement of chords. There was moderate regurgitation directed centrally. Valve area by continuity equation (using LVOT flow): 1.52 cm^2. - Left atrium: The atrium was mildly dilated. - Right atrium: The atrium was mildly dilated. - Atrial septum: No defect or patent foramen ovale was identified. - Tricuspid valve: There was moderate regurgitation. - Pulmonary arteries: Systolic pressure was mildly increased. PA peak pressure: 36 mm Hg (S).    Antimicrobials:  None    Objective: Vitals:   06/12/18 2314 06/13/18 0004 06/13/18 0626 06/13/18 0721  BP:  96/60 (!) 94/57 (!) 86/63  Pulse: (!) 104 (!) 102 99 99  Resp: (!) 22 (!) 22 (!) 21   Temp:  98.8 F (37.1 C) 98.3 F (36.8 C) 98.3 F (36.8 C)  TempSrc:  Oral Oral Oral  SpO2: 95% 96% 98% 98%  Weight:   65.4 kg   Height:        Intake/Output Summary (Last 24 hours) at 06/13/2018 1227 Last data filed at 06/13/2018 7654 Gross per 24 hour  Intake 260 ml  Output 1025 ml  Net -765 ml   Filed Weights   06/10/18 0500 06/11/18 0437 06/13/18 0626  Weight: 66.4 kg 66 kg 65.4 kg    Examination:  Constitutional: NAD Eyes: No icterus seen ENMT: Moist membranes Respiratory: Shallow breathing but no wheezing or crackles, moves air well Cardiovascular: Irregular irregular, no new murmurs, no edema Abdomen: Soft, nontender, nondistended, bowel sounds positive Musculoskeletal: no clubbing / cyanosis.  Skin: No rashes  seen Neurologic: Strength equal 5 out of 5 in all 4 extremities Psychiatric: Normal judgment and insight. Alert and oriented x 3. Normal mood.    Data Reviewed: I have independently reviewed following labs and imaging studies   CBC: Recent Labs  Lab 06/10/18 1411 06/10/18 1926 06/11/18 0332 06/12/18 0507 06/13/18 0423  WBC 6.5 7.5 7.5 7.0 7.3  HGB 9.4* 9.6* 9.3* 8.8* 8.8*  HCT 29.4* 30.2* 29.6* 28.9* 28.1*  MCV 93.3 93.5 94.0 94.1 94.9  PLT 193 208 194 188 650   Basic Metabolic Panel: Recent Labs  Lab 06/09/18 2238 06/10/18 0203 06/10/18 0734 06/11/18 0332 06/12/18 0507 06/13/18 0423  NA 140  --  141 138 134* 135  K 4.1  --  3.9 3.9 3.7 3.7  CL 104  --  104 104 101 102  CO2 32  --  24 24 22 24   GLUCOSE 141*  --  111* 111* 117* 113*  BUN 50*  --  47* 48* 49* 51*  CREATININE 1.86*  --  1.68* 1.71* 1.86* 1.84*  CALCIUM 8.3*  --  8.0* 8.1* 8.1* 7.9*  MG  --  2.5*  --   --   --   --    GFR: Estimated Creatinine Clearance: 18.8 mL/min (A) (by C-G  formula based on SCr of 1.84 mg/dL (H)). Liver Function Tests: Recent Labs  Lab 06/09/18 2242  AST 34  ALT 21  ALKPHOS 85  BILITOT 0.5  PROT 6.5  ALBUMIN 2.5*   No results for input(s): LIPASE, AMYLASE in the last 168 hours. No results for input(s): AMMONIA in the last 168 hours. Coagulation Profile: No results for input(s): INR, PROTIME in the last 168 hours. Cardiac Enzymes: Recent Labs  Lab 06/10/18 0203 06/10/18 0734 06/10/18 1411  TROPONINI <0.03 <0.03 <0.03   BNP (last 3 results) No results for input(s): PROBNP in the last 8760 hours. HbA1C: No results for input(s): HGBA1C in the last 72 hours. CBG: Recent Labs  Lab 06/12/18 1122 06/12/18 1558 06/12/18 2130 06/13/18 0722 06/13/18 1121  GLUCAP 113* 117* 222* 112* 168*   Lipid Profile: No results for input(s): CHOL, HDL, LDLCALC, TRIG, CHOLHDL, LDLDIRECT in the last 72 hours. Thyroid Function Tests: No results for input(s): TSH, T4TOTAL, FREET4,  T3FREE, THYROIDAB in the last 72 hours. Anemia Panel: No results for input(s): VITAMINB12, FOLATE, FERRITIN, TIBC, IRON, RETICCTPCT in the last 72 hours. Urine analysis:    Component Value Date/Time   COLORURINE YELLOW 06/10/2018 1416   APPEARANCEUR CLEAR 06/10/2018 1416   LABSPEC 1.015 06/10/2018 1416   PHURINE 5.0 06/10/2018 1416   GLUCOSEU NEGATIVE 06/10/2018 1416   HGBUR NEGATIVE 06/10/2018 1416   BILIRUBINUR NEGATIVE 06/10/2018 1416   KETONESUR NEGATIVE 06/10/2018 1416   PROTEINUR NEGATIVE 06/10/2018 1416   NITRITE NEGATIVE 06/10/2018 1416   LEUKOCYTESUR NEGATIVE 06/10/2018 1416   Sepsis Labs: Invalid input(s): PROCALCITONIN, LACTICIDVEN  No results found for this or any previous visit (from the past 240 hour(s)).    Radiology Studies: No results found.   Marzetta Board, MD, PhD [facility] Pager [phone]  If 7PM-7AM, please contact night-coverage [email or web address] Password [facility] 06/13/2018, 12:27 PM

## 2018-06-13 NOTE — NC FL2 (Signed)
[place] LEVEL OF CARE SCREENING TOOL     IDENTIFICATION  Patient Name: Madison Riggs Birthdate: 12/18/1927 Sex: female Admission Date (Current Location): 06/09/2018  [facility] and [place] Number:  Herbalist and Address:  [facility], [place] [address], [place], [place] [address]      Provider Number: [id number]  Attending Physician Name and Address:  Caren Griffins, MD  Relative Name and Phone Number:  Addelynn Batte, son, [phone]    Current Level of Care: Hospital Recommended Level of Care: [place] Prior Approval Number:    Date Approved/Denied:   PASRR Number: [id number]  Discharge Plan: SNF    Current Diagnoses: Patient Active Problem List   Diagnosis Date Noted  . New onset atrial fibrillation ([place]) 06/10/2018  . Atrial fibrillation with RVR ([place]) 06/10/2018  . HLD (hyperlipidemia) 06/10/2018  . GERD (gastroesophageal reflux disease) 06/10/2018  . Hypothyroidism 06/10/2018  . Fall 06/10/2018  . Positive occult stool blood test 06/10/2018  . Contusion of right elbow 03/02/2018  . Pain in joint of right elbow 03/02/2018  . Anasarca 02/28/2018  . CKD (chronic kidney disease), stage IV ([place]) 02/28/2018  . Chronic diastolic HF (heart failure) ([place]) 10/30/2016  . Abnormal EKG 10/30/2016  . Nonrheumatic aortic valve stenosis 10/30/2016  . Iron deficiency anemia 10/16/2016  . Acute renal failure (ARF) ([place]) 10/16/2016  . Hypertension 10/16/2016  . Type II diabetes mellitus with renal manifestations ([place]) 10/16/2016  . Dyspnea and respiratory abnormalities 06/02/2016    Orientation RESPIRATION BLADDER Height & Weight     Self, Time, Situation, Place  Normal(CPAP at night (has home CPAP)) Continent, External catheter Weight: 65.4 kg Height:  5\' 2"  (157.5 cm)  BEHAVIORAL SYMPTOMS/MOOD NEUROLOGICAL BOWEL NUTRITION STATUS      Continent Diet(please see DC summary)  AMBULATORY STATUS  COMMUNICATION OF NEEDS Skin   Limited Assist Verbally Normal                       Personal Care Assistance Level of Assistance  Bathing, Feeding, Dressing Bathing Assistance: Limited assistance Feeding assistance: Independent Dressing Assistance: Limited assistance     Functional Limitations Info  Sight, Hearing, Speech Sight Info: Impaired Hearing Info: Impaired Speech Info: Adequate    SPECIAL CARE FACTORS FREQUENCY  OT (By licensed OT), PT (By licensed PT)     PT Frequency: 5x/week OT Frequency: 5x/week            Contractures Contractures Info: Not present    Additional Factors Info  Code Status, Allergies, Psychotropic Code Status Info: Full Allergies Info: No Known Allergies Psychotropic Info: trazadone, effexor         Current Medications (06/13/2018):  This is the current hospital active medication list Current Facility-Administered Medications  Medication Dose Route Frequency Provider Last Rate Last Dose  . acetaminophen (TYLENOL) tablet 650 mg  650 mg Oral Q6H PRN Ivor Costa, MD   650 mg at 06/13/18 1027   Or  . acetaminophen (TYLENOL) suppository 650 mg  650 mg Rectal Q6H PRN Ivor Costa, MD      . allopurinol (ZYLOPRIM) tablet 100 mg  100 mg Oral BID Ivor Costa, MD   100 mg at 06/13/18 1020  . amiodarone (PACERONE) tablet 200 mg  200 mg Oral BID Fransico Him R, MD   200 mg at 06/13/18 1020  . calcium carbonate (OS-CAL - dosed in mg of elemental calcium) tablet 500 mg of elemental calcium  1 tablet Oral Q breakfast Ivor Costa, MD   500 mg of elemental calcium at 06/13/18 1020  . ferrous sulfate tablet 325 mg  325 mg Oral Daily Ivor Costa, MD   325 mg at 06/13/18 1020  . fluticasone (FLONASE) 50 MCG/ACT nasal spray 1 spray  1 spray Each Nare Daily Ivor Costa, MD   1 spray at 06/13/18 1019  . hydrALAZINE (APRESOLINE) injection 5 mg  5 mg Intravenous Q2H PRN Ivor Costa, MD      . hydrOXYzine (ATARAX/VISTARIL) tablet 10 mg  10 mg Oral TID PRN Ivor Costa, MD      . lactobacillus acidophilus & bulgar (LACTINEX) chewable tablet 1 tablet  1 tablet Oral Daily Ivor Costa, MD   1 tablet at 06/12/18 1448  . levothyroxine (SYNTHROID, LEVOTHROID) tablet 100 mcg  100 mcg Oral Q0600 Ivor Costa, MD   100 mcg at 06/13/18 0631  . loratadine (CLARITIN) tablet 10 mg  10 mg Oral Daily Ivor Costa, MD   10 mg at 06/13/18 1020  . magnesium oxide (MAG-OX) tablet 400 mg  400 mg Oral Daily Ivor Costa, MD   400 mg at 06/13/18 1020  . multivitamin with minerals tablet 1 tablet  1 tablet Oral Daily Ivor Costa, MD   1 tablet at 06/13/18 1020  . pantoprazole (PROTONIX) EC tablet 40 mg  40 mg Oral BID Ivor Costa, MD   40 mg at 06/13/18 1020  . senna-docusate (Senokot-S) tablet 1 tablet  1 tablet Oral QHS Ivor Costa, MD   1 tablet at 06/12/18 2204  . simvastatin (ZOCOR) tablet 20 mg  20 mg Oral Daily Ivor Costa, MD   20 mg at 06/13/18 1020  . traZODone (DESYREL) tablet 100 mg  100 mg Oral QHS Ivor Costa, MD   100 mg at 06/12/18 2204  . venlafaxine XR (EFFEXOR-XR) 24 hr capsule 75 mg  75 mg Oral Daily Ivor Costa, MD   75 mg at 06/13/18 1020  . vitamin B-12 (CYANOCOBALAMIN) tablet 5,000 mcg  5,000 mcg Oral Daily Ivor Costa, MD   5,000 mcg at 06/12/18 0947  . zolpidem (AMBIEN) tablet 5 mg  5 mg Oral QHS PRN Ivor Costa, MD   5 mg at 06/10/18 2222     Discharge Medications: Please see discharge summary for a list of discharge medications.  Relevant Imaging Results:  Relevant Lab Results:   Additional Information SSN: [id number]  Estanislado Emms, LCSW

## 2018-06-14 LAB — BASIC METABOLIC PANEL
ANION GAP: 8 (ref 5–15)
BUN: 58 mg/dL — ABNORMAL HIGH (ref 8–23)
CO2: 27 mmol/L (ref 22–32)
Calcium: 8.1 mg/dL — ABNORMAL LOW (ref 8.9–10.3)
Chloride: 102 mmol/L (ref 98–111)
Creatinine, Ser: 2.11 mg/dL — ABNORMAL HIGH (ref 0.44–1.00)
GFR calc Af Amer: 24 mL/min — ABNORMAL LOW (ref >60)
GFR calc non Af Amer: 20 mL/min — ABNORMAL LOW (ref >60)
Glucose, Bld: 112 mg/dL — ABNORMAL HIGH (ref 70–99)
Potassium: 4.3 mmol/L (ref 3.5–5.1)
Sodium: 137 mmol/L (ref 135–145)

## 2018-06-14 LAB — CBC
HCT: 28.8 % — ABNORMAL LOW (ref 36.0–46.0)
Hemoglobin: 9 g/dL — ABNORMAL LOW (ref 12.0–15.0)
MCH: 29.7 pg (ref 26.0–34.0)
MCHC: 31.3 g/dL (ref 30.0–36.0)
MCV: 95 fL (ref 80.0–100.0)
Platelets: 192 10*3/uL (ref 150–400)
RBC: 3.03 MIL/uL — AB (ref 3.87–5.11)
RDW: 16.1 % — ABNORMAL HIGH (ref 11.5–15.5)
WBC: 6.7 10*3/uL (ref 4.0–10.5)
nRBC: 0 % (ref 0.0–0.2)

## 2018-06-14 LAB — GLUCOSE, CAPILLARY: Glucose-Capillary: 97 mg/dL (ref 70–99)

## 2018-06-14 MED ORDER — SODIUM CHLORIDE 0.9 % IV BOLUS
250.0000 mL | Freq: Once | INTRAVENOUS | Status: AC
  Administered 2018-06-14: 250 mL via INTRAVENOUS

## 2018-06-14 NOTE — Progress Notes (Signed)
Progress Note  Patient Name: Madison Riggs Date of Encounter: 06/14/2018  Primary Cardiologist: Minus Breeding, MD   Subjective   She said that she was SOB last night and had to wear the CPAP.  However, I don't see this documented and looks OK this AM with sats high 90s and RA  Inpatient Medications    Scheduled Meds: . allopurinol  100 mg Oral BID  . amiodarone  200 mg Oral BID  . calcium carbonate  1 tablet Oral Q breakfast  . ferrous sulfate  325 mg Oral Daily  . fluticasone  1 spray Each Nare Daily  . lactobacillus acidophilus & bulgar  1 tablet Oral Daily  . levothyroxine  100 mcg Oral Q0600  . loratadine  10 mg Oral Daily  . magnesium oxide  400 mg Oral Daily  . multivitamin with minerals  1 tablet Oral Daily  . pantoprazole  40 mg Oral BID  . senna-docusate  1 tablet Oral QHS  . simvastatin  20 mg Oral Daily  . traZODone  100 mg Oral QHS  . venlafaxine XR  75 mg Oral Daily  . vitamin B-12  5,000 mcg Oral Daily   Continuous Infusions:  PRN Meds: acetaminophen **OR** acetaminophen, hydrALAZINE, hydrOXYzine, zolpidem   Vital Signs    Vitals:   06/13/18 0721 06/13/18 1959 06/13/18 2341 06/14/18 0539  BP: (!) 86/63 (!) 94/58  92/63  Pulse: 99 (!) 101 79 80  Resp:  20 (!) 23 18  Temp: 98.3 F (36.8 C) 98.7 F (37.1 C)  97.8 F (36.6 C)  TempSrc: Oral Oral  Oral  SpO2: 98% 97% 97% 99%  Weight:    66.4 kg  Height:        Intake/Output Summary (Last 24 hours) at 06/14/2018 1042 Last data filed at 06/14/2018 0930 Gross per 24 hour  Intake 540 ml  Output 650 ml  Net -110 ml   Filed Weights   06/11/18 0437 06/13/18 0626 06/14/18 0539  Weight: 66 kg 65.4 kg 66.4 kg    Physical Exam   GEN: No  acute distress.   Neck: No  JVD Cardiac: Irregular RR, no murmurs, rubs, or gallops.  Respiratory:   Few basilar crackles  GI: Soft, nontender, non-distended, normal bowel sounds  MS:  No edema; No deformity. Neuro:   Nonfocal  Psych: Oriented and  appropriate    Labs    Chemistry Recent Labs  Lab 06/09/18 2242  06/12/18 0507 06/13/18 0423 06/14/18 0402  NA  --    < > 134* 135 137  K  --    < > 3.7 3.7 4.3  CL  --    < > 101 102 102  CO2  --    < > 22 24 27   GLUCOSE  --    < > 117* 113* 112*  BUN  --    < > 49* 51* 58*  CREATININE  --    < > 1.86* 1.84* 2.11*  CALCIUM  --    < > 8.1* 7.9* 8.1*  PROT 6.5  --   --   --   --   ALBUMIN 2.5*  --   --   --   --   AST 34  --   --   --   --   ALT 21  --   --   --   --   ALKPHOS 85  --   --   --   --  BILITOT 0.5  --   --   --   --   GFRNONAA  --    < > 24* 24* 20*  GFRAA  --    < > 28* 28* 24*  ANIONGAP  --    < > 11 9 8    < > = values in this interval not displayed.     Hematology Recent Labs  Lab 06/12/18 0507 06/13/18 0423 06/14/18 0402  WBC 7.0 7.3 6.7  RBC 3.07* 2.96* 3.03*  HGB 8.8* 8.8* 9.0*  HCT 28.9* 28.1* 28.8*  MCV 94.1 94.9 95.0  MCH 28.7 29.7 29.7  MCHC 30.4 31.3 31.3  RDW 16.6* 16.5* 16.1*  PLT 188 194 192    Cardiac Enzymes Recent Labs  Lab 06/10/18 0203 06/10/18 0734 06/10/18 1411  TROPONINI <0.03 <0.03 <0.03    Recent Labs  Lab 06/09/18 2244  TROPIPOC 0.01     BNP Recent Labs  Lab 06/10/18 0203  BNP 378.9*     DDimer No results for input(s): DDIMER in the last 168 hours.   Radiology    No results found.  Telemetry    Atrial fib with controlled rate.  - Personally Reviewed  ECG    NA  - Personally Reviewed  Cardiac Studies   Echocardiogram 06/12/2018: Study Conclusions  - Left ventricle: The cavity size was normal. Wall thickness was   normal. Systolic function was vigorous. The estimated ejection   fraction was in the range of 65% to 70%. Wall motion was normal;   there were no regional wall motion abnormalities. The study was   not technically sufficient to allow evaluation of LV diastolic   dysfunction due to atrial fibrillation. Doppler parameters are   consistent with high ventricular filling  pressure. - Aortic valve: Valve mobility was restricted. There was mild to   moderate stenosis. Valve area (VTI): 1.04 cm^2. Valve area   (Vmax): 0.95 cm^2. Valve area (Vmean): 0.97 cm^2. - Mitral valve: Severely calcified annulus. Mildly thickened   leaflets . Mild diffuse calcification, with mild involvement of   chords. There was moderate regurgitation directed centrally.   Valve area by continuity equation (using LVOT flow): 1.52 cm^2. - Left atrium: The atrium was mildly dilated. - Right atrium: The atrium was mildly dilated. - Atrial septum: No defect or patent foramen ovale was identified. - Tricuspid valve: There was moderate regurgitation. - Pulmonary arteries: Systolic pressure was mildly increased. PA   peak pressure: 36 mm Hg (S).  Patient Profile     84 y.o. female with a history of diastolic congestive heart failure, aortic stenosis, hypertension, hyperlipidemia, diet-controlled diabetes, obstructive sleep apnea on CPAP, GERD, hypothyroidism, and CKD stage IV who presented to the [facility] ED on 06/09/2018 after suffering a fall. Cardiology was consulted for evaluation of atrial fibrillation with RVR.   Assessment & Plan    New onset atrial fibrillation with RVR: Soft BP precludes up titration of meds for rate control.  On PO amiodarone.  Rate seems to be fairly well controlled.  No change in therapy today.   Iron deficiency anemia: Anemia is stable.   Chronic diastolic CHF: Creat is increased today.  Holding diuretic.  Encourage PO fluid intake.    Minus Breeding  10:42 AM  06/14/2018  For questions or updates, please contact   Please consult [email or web address] for contact info under Cardiology/STEMI.

## 2018-06-14 NOTE — Progress Notes (Signed)
   06/14/18 1200  PT Visit Information  Last PT Received On 06/14/18   PT - Assessment/Plan  PT Plan Discharge plan needs to be updated  Follow Up Recommendations SNF;Supervision/Assistance - 24 hour  Pt just got lunch.  Called to see pt for possible SNF for rehab.  After discussion with OT, recommend SNF as pt will need SNF for rehab as she had LOB with challenges with OT yesterday.  Will continue PT when pt is available.  Of note, son present and he agrees with SNF as well.  Thanks.

## 2018-06-14 NOTE — Social Work (Signed)
CSW provided SNF bed offers and CMS SNF list to patient and son at bedside. Son to review and follow up to provide choice. Will need [facility] auth before admitting to the facility. CSW to follow.  Estanislado Emms, LCSW [phone]

## 2018-06-14 NOTE — Plan of Care (Signed)
  Problem: Education: Goal: Understanding of medication regimen will improve Outcome: Progressing   Problem: Activity: Goal: Ability to tolerate increased activity will improve Outcome: Progressing

## 2018-06-14 NOTE — Progress Notes (Signed)
RT Note:  Patient stated that she was not using her CPAP tonight.  Will let RN know.

## 2018-06-14 NOTE — Progress Notes (Signed)
PROGRESS NOTE  Madison Riggs:[id number] DOB: 1928/05/27 DOA: 06/09/2018 PCP: Rolene Course, PA-C   LOS: 1 day   Brief Narrative / Interim history: 84 year old female with history of hypertension, hyperlipidemia, diet-controlled diabetes mellitus, hypothyroidism, chronic kidney disease stage IV, sleep apnea on CPAP, diastolic CHF who was admitted to the hospital after having a fall.  She has also been complaining of intermittent shortness of breath, and also reports intermittent rectal bleeding for "quite some time".  She was found to have new onset of A. fib with RVR with worsening anemia with a hemoglobin of 7.0, and worsening creatinine.  She was admitted to the hospitalist service  Subjective: -Feels weak, denies any chest pain or palpitations, denies any shortness of breath.  Assessment & Plan: Principal Problem:   Atrial fibrillation with RVR (HCC) Active Problems:   Iron deficiency anemia   Hypertension   Type II diabetes mellitus with renal manifestations (HCC)   Chronic diastolic HF (heart failure) (HCC)   CKD (chronic kidney disease), stage IV (HCC)   New onset atrial fibrillation (HCC)   HLD (hyperlipidemia)   GERD (gastroesophageal reflux disease)   Hypothyroidism   Fall   Positive occult stool blood test   Principal Problem New onset A. fib with RVR -Cardiology consulted, appreciate input. Soft blood pressure limits beta-blocker and calcium channel blocker, started on IV amiodarone 12/28 -2D echo done yesterday, results below -Amiodarone has been switched to p.o. on 12/29 per cardiology.  Appreciate cardiology follow-up -Telemetry shows rates 100-110  Additional Problems Falls -Probably unsafe to go back home given how frail she is especially that she may not have supervision, discussed with Education officer, museum, will pursue SNF -Patient agreeable  Iron deficiency anemia/positive fecal occult -Discussed with Dr. Paulita Fujita, for now conservative management -Not a  candidate for anticoagulation for A. fib -Status post 2 units of packed red blood cells on 12/27, hemoglobin has overall remained stable.  No significant clinical bleeding  Hypothyroidism -TSH within normal limits at 2.5, continue levothyroxine  Acute kidney injury on chronic kidney disease -Creatinine 1.86 on admission, baseline around 1.4, worsening today at 2.1 -Likely in the setting of hypotension, she is probably intravascularly depleted, will do a small 250 cc bolus and repeat renal function tomorrow morning  Hypertension -For now hold antihypertensives as she is hypotensive  Type 2 diabetes mellitus, diet controlled -Fasting 97 this morning, continue to monitor  Chronic diastolic CHF -2D echo in 2426 showed grade 2 diastolic dysfunction with normal EF -Repeat echo as below  Hyperlipidemia -Continue home medications  Scheduled Meds: . allopurinol  100 mg Oral BID  . amiodarone  200 mg Oral BID  . calcium carbonate  1 tablet Oral Q breakfast  . ferrous sulfate  325 mg Oral Daily  . fluticasone  1 spray Each Nare Daily  . lactobacillus acidophilus & bulgar  1 tablet Oral Daily  . levothyroxine  100 mcg Oral Q0600  . loratadine  10 mg Oral Daily  . magnesium oxide  400 mg Oral Daily  . multivitamin with minerals  1 tablet Oral Daily  . pantoprazole  40 mg Oral BID  . senna-docusate  1 tablet Oral QHS  . simvastatin  20 mg Oral Daily  . traZODone  100 mg Oral QHS  . venlafaxine XR  75 mg Oral Daily  . vitamin B-12  5,000 mcg Oral Daily   Continuous Infusions:  PRN Meds:.acetaminophen **OR** acetaminophen, hydrALAZINE, hydrOXYzine, zolpidem  DVT prophylaxis: SCDs Code Status: Full code Family Communication:  No family present at bedside Disposition Plan: Home versus SNF when ready  Consultants:   Cardiology  Gastroenterology  Procedures:   2D echo Study Conclusions - Left ventricle: The cavity size was normal. Wall thickness was normal. Systolic  function was vigorous. The estimated ejection fraction was in the range of 65% to 70%. Wall motion was normal; there were no regional wall motion abnormalities. The study was not technically sufficient to allow evaluation of LV diastolic dysfunction due to atrial fibrillation. Doppler parameters are consistent with high ventricular filling pressure. - Aortic valve: Valve mobility was restricted. There was mild to moderate stenosis. Valve area (VTI): 1.04 cm^2. Valve area (Vmax): 0.95 cm^2. Valve area (Vmean): 0.97 cm^2. - Mitral valve: Severely calcified annulus. Mildly thickened leaflets . Mild diffuse calcification, with mild involvement of chords. There was moderate regurgitation directed centrally. Valve area by continuity equation (using LVOT flow): 1.52 cm^2. - Left atrium: The atrium was mildly dilated. - Right atrium: The atrium was mildly dilated. - Atrial septum: No defect or patent foramen ovale was identified. - Tricuspid valve: There was moderate regurgitation. - Pulmonary arteries: Systolic pressure was mildly increased. PA peak pressure: 36 mm Hg (S).    Antimicrobials:  None    Objective: Vitals:   06/13/18 0721 06/13/18 1959 06/13/18 2341 06/14/18 0539  BP: (!) 86/63 (!) 94/58  92/63  Pulse: 99 (!) 101 79 80  Resp:  20 (!) 23 18  Temp: 98.3 F (36.8 C) 98.7 F (37.1 C)  97.8 F (36.6 C)  TempSrc: Oral Oral  Oral  SpO2: 98% 97% 97% 99%  Weight:    66.4 kg  Height:        Intake/Output Summary (Last 24 hours) at 06/14/2018 1238 Last data filed at 06/14/2018 0930 Gross per 24 hour  Intake 540 ml  Output 650 ml  Net -110 ml   Filed Weights   06/11/18 0437 06/13/18 0626 06/14/18 0539  Weight: 66 kg 65.4 kg 66.4 kg    Examination:  Constitutional: No distress Eyes: No scleral icterus ENMT: Moist mucous membranes Respiratory: Clear to auscultation bilaterally without wheezing or crackles, good air movement Cardiovascular: irregularly irregular, no new  murmurs, no edema Abdomen: Soft, NT, ND, bowel sounds positive Musculoskeletal: no clubbing / cyanosis.  Skin: No rashes seen Neurologic: No focal deficits Psychiatric: Normal judgment and insight. Alert and oriented x 3. Normal mood.    Data Reviewed: I have independently reviewed following labs and imaging studies   CBC: Recent Labs  Lab 06/10/18 1926 06/11/18 0332 06/12/18 0507 06/13/18 0423 06/14/18 0402  WBC 7.5 7.5 7.0 7.3 6.7  HGB 9.6* 9.3* 8.8* 8.8* 9.0*  HCT 30.2* 29.6* 28.9* 28.1* 28.8*  MCV 93.5 94.0 94.1 94.9 95.0  PLT 208 194 188 194 762   Basic Metabolic Panel: Recent Labs  Lab 06/10/18 0203 06/10/18 0734 06/11/18 0332 06/12/18 0507 06/13/18 0423 06/14/18 0402  NA  --  141 138 134* 135 137  K  --  3.9 3.9 3.7 3.7 4.3  CL  --  104 104 101 102 102  CO2  --  24 24 22 24 27   GLUCOSE  --  111* 111* 117* 113* 112*  BUN  --  47* 48* 49* 51* 58*  CREATININE  --  1.68* 1.71* 1.86* 1.84* 2.11*  CALCIUM  --  8.0* 8.1* 8.1* 7.9* 8.1*  MG 2.5*  --   --   --   --   --    GFR: Estimated Creatinine  Clearance: 16.5 mL/min (A) (by C-G formula based on SCr of 2.11 mg/dL (H)). Liver Function Tests: Recent Labs  Lab 06/09/18 2242  AST 34  ALT 21  ALKPHOS 85  BILITOT 0.5  PROT 6.5  ALBUMIN 2.5*   No results for input(s): LIPASE, AMYLASE in the last 168 hours. No results for input(s): AMMONIA in the last 168 hours. Coagulation Profile: No results for input(s): INR, PROTIME in the last 168 hours. Cardiac Enzymes: Recent Labs  Lab 06/10/18 0203 06/10/18 0734 06/10/18 1411  TROPONINI <0.03 <0.03 <0.03   BNP (last 3 results) No results for input(s): PROBNP in the last 8760 hours. HbA1C: No results for input(s): HGBA1C in the last 72 hours. CBG: Recent Labs  Lab 06/12/18 1558 06/12/18 2130 06/13/18 0722 06/13/18 1121 06/14/18 0745  GLUCAP 117* 222* 112* 168* 97   Lipid Profile: No results for input(s): CHOL, HDL, LDLCALC, TRIG, CHOLHDL, LDLDIRECT  in the last 72 hours. Thyroid Function Tests: No results for input(s): TSH, T4TOTAL, FREET4, T3FREE, THYROIDAB in the last 72 hours. Anemia Panel: No results for input(s): VITAMINB12, FOLATE, FERRITIN, TIBC, IRON, RETICCTPCT in the last 72 hours. Urine analysis:    Component Value Date/Time   COLORURINE YELLOW 06/10/2018 1416   APPEARANCEUR CLEAR 06/10/2018 1416   LABSPEC 1.015 06/10/2018 1416   PHURINE 5.0 06/10/2018 1416   GLUCOSEU NEGATIVE 06/10/2018 1416   HGBUR NEGATIVE 06/10/2018 1416   BILIRUBINUR NEGATIVE 06/10/2018 1416   KETONESUR NEGATIVE 06/10/2018 1416   PROTEINUR NEGATIVE 06/10/2018 1416   NITRITE NEGATIVE 06/10/2018 1416   LEUKOCYTESUR NEGATIVE 06/10/2018 1416   Sepsis Labs: Invalid input(s): PROCALCITONIN, LACTICIDVEN  No results found for this or any previous visit (from the past 240 hour(s)).    Radiology Studies: No results found.   Marzetta Board, MD, PhD [facility] Pager [phone]  If 7PM-7AM, please contact night-coverage [email or web address] Password [facility] 06/14/2018, 12:38 PM

## 2018-06-15 DIAGNOSIS — D508 Other iron deficiency anemias: Secondary | ICD-10-CM

## 2018-06-15 LAB — BASIC METABOLIC PANEL
Anion gap: 6 (ref 5–15)
BUN: 61 mg/dL — ABNORMAL HIGH (ref 8–23)
CO2: 27 mmol/L (ref 22–32)
Calcium: 8.2 mg/dL — ABNORMAL LOW (ref 8.9–10.3)
Chloride: 102 mmol/L (ref 98–111)
Creatinine, Ser: 1.98 mg/dL — ABNORMAL HIGH (ref 0.44–1.00)
GFR calc Af Amer: 26 mL/min — ABNORMAL LOW (ref >60)
GFR calc non Af Amer: 22 mL/min — ABNORMAL LOW (ref >60)
Glucose, Bld: 116 mg/dL — ABNORMAL HIGH (ref 70–99)
POTASSIUM: 4.4 mmol/L (ref 3.5–5.1)
Sodium: 135 mmol/L (ref 135–145)

## 2018-06-15 LAB — CBC
HCT: 28.7 % — ABNORMAL LOW (ref 36.0–46.0)
Hemoglobin: 8.9 g/dL — ABNORMAL LOW (ref 12.0–15.0)
MCH: 29.6 pg (ref 26.0–34.0)
MCHC: 31 g/dL (ref 30.0–36.0)
MCV: 95.3 fL (ref 80.0–100.0)
PLATELETS: 199 10*3/uL (ref 150–400)
RBC: 3.01 MIL/uL — AB (ref 3.87–5.11)
RDW: 16.2 % — ABNORMAL HIGH (ref 11.5–15.5)
WBC: 7.2 10*3/uL (ref 4.0–10.5)
nRBC: 0 % (ref 0.0–0.2)

## 2018-06-15 LAB — GLUCOSE, CAPILLARY: Glucose-Capillary: 97 mg/dL (ref 70–99)

## 2018-06-15 MED ORDER — POLYETHYLENE GLYCOL 3350 17 G PO PACK
17.0000 g | PACK | Freq: Two times a day (BID) | ORAL | Status: AC
  Administered 2018-06-15 (×2): 17 g via ORAL
  Filled 2018-06-15 (×2): qty 1

## 2018-06-15 MED ORDER — SODIUM CHLORIDE 0.9 % IV SOLN
510.0000 mg | Freq: Once | INTRAVENOUS | Status: AC
  Administered 2018-06-15: 510 mg via INTRAVENOUS
  Filled 2018-06-15: qty 17

## 2018-06-15 MED ORDER — HEPARIN SODIUM (PORCINE) 5000 UNIT/ML IJ SOLN
5000.0000 [IU] | Freq: Three times a day (TID) | INTRAMUSCULAR | Status: DC
  Administered 2018-06-15 – 2018-06-20 (×15): 5000 [IU] via SUBCUTANEOUS
  Filled 2018-06-15 (×15): qty 1

## 2018-06-15 MED ORDER — FUROSEMIDE 10 MG/ML IJ SOLN
40.0000 mg | Freq: Two times a day (BID) | INTRAMUSCULAR | Status: DC
  Administered 2018-06-15 – 2018-06-17 (×5): 40 mg via INTRAVENOUS
  Filled 2018-06-15 (×5): qty 4

## 2018-06-15 MED ORDER — SENNOSIDES-DOCUSATE SODIUM 8.6-50 MG PO TABS
3.0000 | ORAL_TABLET | Freq: Two times a day (BID) | ORAL | Status: DC
  Administered 2018-06-15 – 2018-06-20 (×11): 3 via ORAL
  Filled 2018-06-15 (×12): qty 3

## 2018-06-15 NOTE — Social Work (Signed)
Patient's son contacted CSW and gave SNF choice - [facility]. CSW left voicemail for admissions at [facility] requesting they start patient's [facility] authorization. No one working in admissions today, will follow up tomorrow. [employer] auth needed before patient can admit to the facility. CSW to follow and support with discharge planning.  Estanislado Emms, LCSW [phone]

## 2018-06-15 NOTE — Progress Notes (Addendum)
Progress Note  Patient Name: Madison Riggs Date of Encounter: 06/15/2018  Primary Cardiologist: Minus Breeding, MD   Subjective   SOB while ambulating yesterday.  Weight continues to go up.  Did not want to wear CPAP last night.    Inpatient Medications    Scheduled Meds: . allopurinol  100 mg Oral BID  . amiodarone  200 mg Oral BID  . calcium carbonate  1 tablet Oral Q breakfast  . ferrous sulfate  325 mg Oral Daily  . fluticasone  1 spray Each Nare Daily  . lactobacillus acidophilus & bulgar  1 tablet Oral Daily  . levothyroxine  100 mcg Oral Q0600  . loratadine  10 mg Oral Daily  . magnesium oxide  400 mg Oral Daily  . multivitamin with minerals  1 tablet Oral Daily  . pantoprazole  40 mg Oral BID  . senna-docusate  1 tablet Oral QHS  . simvastatin  20 mg Oral Daily  . traZODone  100 mg Oral QHS  . venlafaxine XR  75 mg Oral Daily  . vitamin B-12  5,000 mcg Oral Daily   Continuous Infusions:  PRN Meds: acetaminophen **OR** acetaminophen, hydrALAZINE, hydrOXYzine, zolpidem   Vital Signs    Vitals:   06/14/18 0539 06/14/18 1450 06/14/18 2038 06/15/18 0601  BP: 92/63  93/60 93/60  Pulse: 80  88 (!) 106  Resp: 18  18 14   Temp: 97.8 F (36.6 C) 98.9 F (37.2 C) 99 F (37.2 C) 99 F (37.2 C)  TempSrc: Oral Oral Oral Oral  SpO2: 99%  96% 97%  Weight: 66.4 kg   71 kg  Height:        Intake/Output Summary (Last 24 hours) at 06/15/2018 0903 Last data filed at 06/15/2018 5643 Gross per 24 hour  Intake 840 ml  Output 420 ml  Net 420 ml   Filed Weights   06/13/18 0626 06/14/18 0539 06/15/18 0601  Weight: 65.4 kg 66.4 kg 71 kg    Physical Exam   GEN: No  acute distress.   Neck:   Positive  JVD Cardiac:   Irregular RR, no murmurs, rubs, or gallops.  Respiratory: Clear to auscultation bilaterally. GI: Soft, nontender, non-distended, normal bowel sounds, distended MS:  Severe edema; No deformity. Neuro:   Nonfocal  Psych: Oriented and appropriate      Labs    Chemistry Recent Labs  Lab 06/09/18 2242  06/13/18 0423 06/14/18 0402 06/15/18 0401  NA  --    < > 135 137 135  K  --    < > 3.7 4.3 4.4  CL  --    < > 102 102 102  CO2  --    < > 24 27 27   GLUCOSE  --    < > 113* 112* 116*  BUN  --    < > 51* 58* 61*  CREATININE  --    < > 1.84* 2.11* 1.98*  CALCIUM  --    < > 7.9* 8.1* 8.2*  PROT 6.5  --   --   --   --   ALBUMIN 2.5*  --   --   --   --   AST 34  --   --   --   --   ALT 21  --   --   --   --   ALKPHOS 85  --   --   --   --   BILITOT 0.5  --   --   --   --  GFRNONAA  --    < > 24* 20* 22*  GFRAA  --    < > 28* 24* 26*  ANIONGAP  --    < > 9 8 6    < > = values in this interval not displayed.     Hematology Recent Labs  Lab 06/13/18 0423 06/14/18 0402 06/15/18 0401  WBC 7.3 6.7 7.2  RBC 2.96* 3.03* 3.01*  HGB 8.8* 9.0* 8.9*  HCT 28.1* 28.8* 28.7*  MCV 94.9 95.0 95.3  MCH 29.7 29.7 29.6  MCHC 31.3 31.3 31.0  RDW 16.5* 16.1* 16.2*  PLT 194 192 199    Cardiac Enzymes Recent Labs  Lab 06/10/18 0203 06/10/18 0734 06/10/18 1411  TROPONINI <0.03 <0.03 <0.03    Recent Labs  Lab 06/09/18 2244  TROPIPOC 0.01     BNP Recent Labs  Lab 06/10/18 0203  BNP 378.9*     DDimer No results for input(s): DDIMER in the last 168 hours.   Radiology    No results found.  Telemetry    Atrial fib with controlled rate.  - Personally Reviewed  ECG    NA  - Personally Reviewed  Cardiac Studies   Echocardiogram 06/12/2018: Study Conclusions  - Left ventricle: The cavity size was normal. Wall thickness was   normal. Systolic function was vigorous. The estimated ejection   fraction was in the range of 65% to 70%. Wall motion was normal;   there were no regional wall motion abnormalities. The study was   not technically sufficient to allow evaluation of LV diastolic   dysfunction due to atrial fibrillation. Doppler parameters are   consistent with high ventricular filling pressure. - Aortic  valve: Valve mobility was restricted. There was mild to   moderate stenosis. Valve area (VTI): 1.04 cm^2. Valve area   (Vmax): 0.95 cm^2. Valve area (Vmean): 0.97 cm^2. - Mitral valve: Severely calcified annulus. Mildly thickened   leaflets . Mild diffuse calcification, with mild involvement of   chords. There was moderate regurgitation directed centrally.   Valve area by continuity equation (using LVOT flow): 1.52 cm^2. - Left atrium: The atrium was mildly dilated. - Right atrium: The atrium was mildly dilated. - Atrial septum: No defect or patent foramen ovale was identified. - Tricuspid valve: There was moderate regurgitation. - Pulmonary arteries: Systolic pressure was mildly increased. PA   peak pressure: 36 mm Hg (S).  Patient Profile     85 y.o. female with a history of diastolic congestive heart failure, aortic stenosis, hypertension, hyperlipidemia, diet-controlled diabetes, obstructive sleep apnea on CPAP, GERD, hypothyroidism, and CKD stage IV who presented to the [facility] ED on 06/09/2018 after suffering a fall. Cardiology was consulted for evaluation of atrial fibrillation with RVR.   Assessment & Plan    New onset atrial fibrillation with RVR: On amiodarone for rate control.  No anticoagulation.    Iron deficiency anemia: She wants to talk to the primary MD about restarting IV iron as an outpatient.   Chronic diastolic CHF: She has significant volume overload and edema and now with increased pulmonary edema.  I will give two doses of IV diuresis today.  She needs compression stockings and to keep her feet elevated.   Greater than 35 minutes with the chart and patient.  Greater than 1/2 the time with direct patient contact.    Minus Breeding  9:03 AM  06/15/2018  For questions or updates, please contact   Please consult [email or web address] for contact info under Cardiology/STEMI.

## 2018-06-15 NOTE — Progress Notes (Signed)
PROGRESS NOTE  Madison Riggs QJJ:[id number] DOB: 1928/03/15 DOA: 06/09/2018 PCP: Rolene Course, PA-C   LOS: 2 days   Brief Narrative / Interim history: 85 year old female with history of hypertension, hyperlipidemia, diet-controlled diabetes mellitus, hypothyroidism, chronic kidney disease stage IV, sleep apnea on CPAP, diastolic CHF who was admitted to the hospital after having a fall.  She has also been complaining of intermittent shortness of breath, and also reports intermittent rectal bleeding for "quite some time".  She was found to have new onset of A. fib with RVR with worsening anemia with a hemoglobin of 7.0, and worsening creatinine.  She was admitted to the hospitalist service  Subjective: -Feels weak, denies any chest pain or palpitations, denies any shortness of breath.  Reports constipation, she had only one bowel movement since admission,  Assessment & Plan: Principal Problem:   Atrial fibrillation with RVR (HCC) Active Problems:   Iron deficiency anemia   Hypertension   Type II diabetes mellitus with renal manifestations (HCC)   Chronic diastolic HF (heart failure) (HCC)   CKD (chronic kidney disease), stage IV ([place])   New onset atrial fibrillation (HCC)   HLD (hyperlipidemia)   GERD (gastroesophageal reflux disease)   Hypothyroidism   Fall   Positive occult stool blood test   New onset A. fib with RVR -Cardiology consulted, appreciate input. Soft blood pressure limits beta-blocker and calcium channel blocker, started on IV amiodarone 12/28 -2D echo done , results below -Amiodarone has been switched to p.o. on 12/29 per cardiology.  Appreciate cardiology follow-up -Telemetry shows rates 100-110  Acute on chronic diastolic CHF -2D echo done this admission showing preserved EF, unable to evaluate for diastolic function, but she is with known grade 2 diastolic dysfunction on echo 2018 -Have worsening lower extremity edema today, started on IV diuresis per  cardiology, continue to monitor closely, daily weight and strict ins and outs, low-salt diet  Falls -Probably unsafe to go back home given how frail she is especially that she may not have supervision, discussed with Education officer, museum, will pursue SNF -Patient agreeable  Iron deficiency anemia/positive fecal occult -Discussed with Dr. Paulita Fujita, for now conservative management -Not a candidate for anticoagulation for A. fib -Status post 2 units of packed red blood cells on 12/27, hemoglobin has overall remained stable.  No significant clinical bleeding -Use IV iron today, as well known CKD, likely will benefit from Procrit and IV iron as an outpatient, she is following with Dr. Clover Mealy as an outpatient, will discuss with her before discharge.  Hypothyroidism -TSH within normal limits at 2.5, continue levothyroxine  Acute kidney injury on chronic kidney disease -Creatinine 1.86 on admission, baseline around 1.4, worsening today at 2.1 -Likely in the setting of hypotension, she is probably intravascularly depleted, will do a small 250 cc bolus and repeat renal function tomorrow morning  Hypertension -For now hold antihypertensives as she is hypotensive  Type 2 diabetes mellitus, diet controlled -Fasting 97 this morning, continue to monitor   Hyperlipidemia -Continue home medications  Scheduled Meds: . allopurinol  100 mg Oral BID  . amiodarone  200 mg Oral BID  . calcium carbonate  1 tablet Oral Q breakfast  . ferrous sulfate  325 mg Oral Daily  . fluticasone  1 spray Each Nare Daily  . furosemide  40 mg Intravenous BID  . lactobacillus acidophilus & bulgar  1 tablet Oral Daily  . levothyroxine  100 mcg Oral Q0600  . loratadine  10 mg Oral Daily  . magnesium oxide  400 mg Oral Daily  . multivitamin with minerals  1 tablet Oral Daily  . pantoprazole  40 mg Oral BID  . polyethylene glycol  17 g Oral BID  . senna-docusate  3 tablet Oral BID  . simvastatin  20 mg Oral Daily    . traZODone  100 mg Oral QHS  . venlafaxine XR  75 mg Oral Daily  . vitamin B-12  5,000 mcg Oral Daily   Continuous Infusions:  PRN Meds:.acetaminophen **OR** acetaminophen, hydrALAZINE, hydrOXYzine, zolpidem  DVT prophylaxis: SCDs Code Status: Full code Family Communication: Son at bedside Disposition Plan: SNF  Consultants:   Cardiology  Gastroenterology  Procedures:   2D echo Study Conclusions - Left ventricle: The cavity size was normal. Wall thickness was normal. Systolic function was vigorous. The estimated ejection fraction was in the range of 65% to 70%. Wall motion was normal; there were no regional wall motion abnormalities. The study was not technically sufficient to allow evaluation of LV diastolic dysfunction due to atrial fibrillation. Doppler parameters are consistent with high ventricular filling pressure. - Aortic valve: Valve mobility was restricted. There was mild to moderate stenosis. Valve area (VTI): 1.04 cm^2. Valve area (Vmax): 0.95 cm^2. Valve area (Vmean): 0.97 cm^2. - Mitral valve: Severely calcified annulus. Mildly thickened leaflets . Mild diffuse calcification, with mild involvement of chords. There was moderate regurgitation directed centrally. Valve area by continuity equation (using LVOT flow): 1.52 cm^2. - Left atrium: The atrium was mildly dilated. - Right atrium: The atrium was mildly dilated. - Atrial septum: No defect or patent foramen ovale was identified. - Tricuspid valve: There was moderate regurgitation. - Pulmonary arteries: Systolic pressure was mildly increased. PA peak pressure: 36 mm Hg (S).    Antimicrobials:  None    Objective: Vitals:   06/14/18 1450 06/14/18 2038 06/15/18 0601 06/15/18 1108  BP:  93/60 93/60 90/62   Pulse:  88 (!) 106 87  Resp:  18 14 18   Temp: 98.9 F (37.2 C) 99 F (37.2 C) 99 F (37.2 C) 98.8 F (37.1 C)  TempSrc: Oral Oral Oral Oral  SpO2:  96% 97% 95%  Weight:   71 kg   Height:         Intake/Output Summary (Last 24 hours) at 06/15/2018 1525 Last data filed at 06/15/2018 0900 Gross per 24 hour  Intake 840 ml  Output 420 ml  Net 420 ml   Filed Weights   06/13/18 0626 06/14/18 0539 06/15/18 0601  Weight: 65.4 kg 66.4 kg 71 kg    Examination:  Awake Alert, Oriented X 3, frail, chronically ill-appearing no new F.N deficits, Normal affect Symmetrical Chest wall movement, Good air movement bilaterally, CTAB RRR,No Gallops,Rubs or new Murmurs, No Parasternal Heave +ve B.Sounds, Abd Soft, No tenderness, No rebound - guarding or rigidity. No Cyanosis, Clubbing , +2 edema, No new Rash or bruise     Data Reviewed: I have independently reviewed following labs and imaging studies   CBC: Recent Labs  Lab 06/11/18 0332 06/12/18 0507 06/13/18 0423 06/14/18 0402 06/15/18 0401  WBC 7.5 7.0 7.3 6.7 7.2  HGB 9.3* 8.8* 8.8* 9.0* 8.9*  HCT 29.6* 28.9* 28.1* 28.8* 28.7*  MCV 94.0 94.1 94.9 95.0 95.3  PLT 194 188 194 192 025   Basic Metabolic Panel: Recent Labs  Lab 06/10/18 0203  06/11/18 0332 06/12/18 0507 06/13/18 0423 06/14/18 0402 06/15/18 0401  NA  --    < > 138 134* 135 137 135  K  --    < >  3.9 3.7 3.7 4.3 4.4  CL  --    < > 104 101 102 102 102  CO2  --    < > 24 22 24 27 27   GLUCOSE  --    < > 111* 117* 113* 112* 116*  BUN  --    < > 48* 49* 51* 58* 61*  CREATININE  --    < > 1.71* 1.86* 1.84* 2.11* 1.98*  CALCIUM  --    < > 8.1* 8.1* 7.9* 8.1* 8.2*  MG 2.5*  --   --   --   --   --   --    < > = values in this interval not displayed.   GFR: Estimated Creatinine Clearance: 18.1 mL/min (A) (by C-G formula based on SCr of 1.98 mg/dL (H)). Liver Function Tests: Recent Labs  Lab 06/09/18 2242  AST 34  ALT 21  ALKPHOS 85  BILITOT 0.5  PROT 6.5  ALBUMIN 2.5*   No results for input(s): LIPASE, AMYLASE in the last 168 hours. No results for input(s): AMMONIA in the last 168 hours. Coagulation Profile: No results for input(s): INR, PROTIME in the  last 168 hours. Cardiac Enzymes: Recent Labs  Lab 06/10/18 0203 06/10/18 0734 06/10/18 1411  TROPONINI <0.03 <0.03 <0.03   BNP (last 3 results) No results for input(s): PROBNP in the last 8760 hours. HbA1C: No results for input(s): HGBA1C in the last 72 hours. CBG: Recent Labs  Lab 06/12/18 2130 06/13/18 0722 06/13/18 1121 06/14/18 0745 06/15/18 0759  GLUCAP 222* 112* 168* 97 97   Lipid Profile: No results for input(s): CHOL, HDL, LDLCALC, TRIG, CHOLHDL, LDLDIRECT in the last 72 hours. Thyroid Function Tests: No results for input(s): TSH, T4TOTAL, FREET4, T3FREE, THYROIDAB in the last 72 hours. Anemia Panel: No results for input(s): VITAMINB12, FOLATE, FERRITIN, TIBC, IRON, RETICCTPCT in the last 72 hours. Urine analysis:    Component Value Date/Time   COLORURINE YELLOW 06/10/2018 1416   APPEARANCEUR CLEAR 06/10/2018 1416   LABSPEC 1.015 06/10/2018 1416   PHURINE 5.0 06/10/2018 1416   GLUCOSEU NEGATIVE 06/10/2018 1416   HGBUR NEGATIVE 06/10/2018 1416   BILIRUBINUR NEGATIVE 06/10/2018 1416   KETONESUR NEGATIVE 06/10/2018 1416   PROTEINUR NEGATIVE 06/10/2018 1416   NITRITE NEGATIVE 06/10/2018 1416   LEUKOCYTESUR NEGATIVE 06/10/2018 1416   Sepsis Labs: Invalid input(s): PROCALCITONIN, LACTICIDVEN  No results found for this or any previous visit (from the past 240 hour(s)).    Radiology Studies: No results found.   Phillips Climes , MD,  [facility] Pager [phone]  If 7PM-7AM, please contact night-coverage [email or web address] Password [email or web address] 06/15/2018, 3:25 PM

## 2018-06-15 NOTE — Progress Notes (Signed)
   06/15/18 0800  PT Visit Information  Last PT Received On 06/15/18   PT - Assessment/Plan  PT Frequency (ACUTE ONLY) Min 2X/week  Changed frequency to 2x week due to this is appropriate frequency for pts going to SNF per frequency guidelines.  Thanks.  Vance Pager:  [phone]  Office:  [phone]

## 2018-06-15 NOTE — Progress Notes (Signed)
RT note: Patient refusing CPAP. RT will continue to monitor

## 2018-06-16 ENCOUNTER — Other Ambulatory Visit: Payer: Self-pay

## 2018-06-16 LAB — BASIC METABOLIC PANEL
Anion gap: 8 (ref 5–15)
BUN: 65 mg/dL — ABNORMAL HIGH (ref 8–23)
CALCIUM: 7.9 mg/dL — AB (ref 8.9–10.3)
CO2: 23 mmol/L (ref 22–32)
Chloride: 103 mmol/L (ref 98–111)
Creatinine, Ser: 1.95 mg/dL — ABNORMAL HIGH (ref 0.44–1.00)
GFR calc Af Amer: 26 mL/min — ABNORMAL LOW (ref >60)
GFR calc non Af Amer: 22 mL/min — ABNORMAL LOW (ref >60)
Glucose, Bld: 171 mg/dL — ABNORMAL HIGH (ref 70–99)
Potassium: 4.2 mmol/L (ref 3.5–5.1)
Sodium: 134 mmol/L — ABNORMAL LOW (ref 135–145)

## 2018-06-16 LAB — CBC
HCT: 26.8 % — ABNORMAL LOW (ref 36.0–46.0)
Hemoglobin: 8.4 g/dL — ABNORMAL LOW (ref 12.0–15.0)
MCH: 29.7 pg (ref 26.0–34.0)
MCHC: 31.3 g/dL (ref 30.0–36.0)
MCV: 94.7 fL (ref 80.0–100.0)
Platelets: 196 10*3/uL (ref 150–400)
RBC: 2.83 MIL/uL — ABNORMAL LOW (ref 3.87–5.11)
RDW: 15.9 % — ABNORMAL HIGH (ref 11.5–15.5)
WBC: 6.4 10*3/uL (ref 4.0–10.5)
nRBC: 0 % (ref 0.0–0.2)

## 2018-06-16 LAB — GLUCOSE, CAPILLARY: Glucose-Capillary: 123 mg/dL — ABNORMAL HIGH (ref 70–99)

## 2018-06-16 NOTE — Progress Notes (Addendum)
Progress Note  Patient Name: Madison Riggs Date of Encounter: 06/16/2018  Primary Cardiologist: Minus Breeding, MD   Subjective   No chest pain.  Breathing better.  Did not walk yesterday.   Inpatient Medications    Scheduled Meds: . allopurinol  100 mg Oral BID  . amiodarone  200 mg Oral BID  . calcium carbonate  1 tablet Oral Q breakfast  . ferrous sulfate  325 mg Oral Daily  . fluticasone  1 spray Each Nare Daily  . furosemide  40 mg Intravenous BID  . heparin injection (subcutaneous)  5,000 Units Subcutaneous Q8H  . lactobacillus acidophilus & bulgar  1 tablet Oral Daily  . levothyroxine  100 mcg Oral Q0600  . loratadine  10 mg Oral Daily  . magnesium oxide  400 mg Oral Daily  . multivitamin with minerals  1 tablet Oral Daily  . pantoprazole  40 mg Oral BID  . senna-docusate  3 tablet Oral BID  . simvastatin  20 mg Oral Daily  . traZODone  100 mg Oral QHS  . venlafaxine XR  75 mg Oral Daily  . vitamin B-12  5,000 mcg Oral Daily   Continuous Infusions:  PRN Meds: acetaminophen **OR** acetaminophen, hydrALAZINE, hydrOXYzine, zolpidem   Vital Signs    Vitals:   06/15/18 0601 06/15/18 1108 06/15/18 2058 06/16/18 0518  BP: 93/60 90/62 (!) 96/59 97/63  Pulse: (!) 106 87 74 75  Resp: 14 18    Temp: 99 F (37.2 C) 98.8 F (37.1 C) 99.7 F (37.6 C) 99.1 F (37.3 C)  TempSrc: Oral Oral Oral Oral  SpO2: 97% 95% 97% 97%  Weight: 71 kg   71.2 kg  Height:        Intake/Output Summary (Last 24 hours) at 06/16/2018 0745 Last data filed at 06/15/2018 2300 Gross per 24 hour  Intake 1309.11 ml  Output 800 ml  Net 509.11 ml   Filed Weights   06/14/18 0539 06/15/18 0601 06/16/18 0518  Weight: 66.4 kg 71 kg 71.2 kg    Physical Exam   GEN: No  acute distress.   Neck: No  JVD Cardiac: Irregular RR, no murmurs, rubs, or gallops.  Respiratory: Clear  to auscultation bilaterally. GI: Soft, nontender, non-distended, normal bowel sounds  MS:  Moderate thight  edema; No deformity. Neuro:   Nonfocal  Psych: Oriented and appropriate    Labs    Chemistry Recent Labs  Lab 06/09/18 2242  06/14/18 0402 06/15/18 0401 06/16/18 0335  NA  --    < > 137 135 134*  K  --    < > 4.3 4.4 4.2  CL  --    < > 102 102 103  CO2  --    < > 27 27 23   GLUCOSE  --    < > 112* 116* 171*  BUN  --    < > 58* 61* 65*  CREATININE  --    < > 2.11* 1.98* 1.95*  CALCIUM  --    < > 8.1* 8.2* 7.9*  PROT 6.5  --   --   --   --   ALBUMIN 2.5*  --   --   --   --   AST 34  --   --   --   --   ALT 21  --   --   --   --   ALKPHOS 85  --   --   --   --  BILITOT 0.5  --   --   --   --   GFRNONAA  --    < > 20* 22* 22*  GFRAA  --    < > 24* 26* 26*  ANIONGAP  --    < > 8 6 8    < > = values in this interval not displayed.     Hematology Recent Labs  Lab 06/14/18 0402 06/15/18 0401 06/16/18 0335  WBC 6.7 7.2 6.4  RBC 3.03* 3.01* 2.83*  HGB 9.0* 8.9* 8.4*  HCT 28.8* 28.7* 26.8*  MCV 95.0 95.3 94.7  MCH 29.7 29.6 29.7  MCHC 31.3 31.0 31.3  RDW 16.1* 16.2* 15.9*  PLT 192 199 196    Cardiac Enzymes Recent Labs  Lab 06/10/18 0203 06/10/18 0734 06/10/18 1411  TROPONINI <0.03 <0.03 <0.03    Recent Labs  Lab 06/09/18 2244  TROPIPOC 0.01     BNP Recent Labs  Lab 06/10/18 0203  BNP 378.9*     DDimer No results for input(s): DDIMER in the last 168 hours.   Radiology    No results found.  Telemetry    Atrial fib with controlled rate.   - Personally Reviewed  ECG    NA  - Personally Reviewed  Cardiac Studies   Echocardiogram 06/12/2018: Study Conclusions  - Left ventricle: The cavity size was normal. Wall thickness was   normal. Systolic function was vigorous. The estimated ejection   fraction was in the range of 65% to 70%. Wall motion was normal;   there were no regional wall motion abnormalities. The study was   not technically sufficient to allow evaluation of LV diastolic   dysfunction due to atrial fibrillation. Doppler  parameters are   consistent with high ventricular filling pressure. - Aortic valve: Valve mobility was restricted. There was mild to   moderate stenosis. Valve area (VTI): 1.04 cm^2. Valve area   (Vmax): 0.95 cm^2. Valve area (Vmean): 0.97 cm^2. - Mitral valve: Severely calcified annulus. Mildly thickened   leaflets . Mild diffuse calcification, with mild involvement of   chords. There was moderate regurgitation directed centrally.   Valve area by continuity equation (using LVOT flow): 1.52 cm^2. - Left atrium: The atrium was mildly dilated. - Right atrium: The atrium was mildly dilated. - Atrial septum: No defect or patent foramen ovale was identified. - Tricuspid valve: There was moderate regurgitation. - Pulmonary arteries: Systolic pressure was mildly increased. PA   peak pressure: 36 mm Hg (S).  Patient Profile     85 y.o. female with a history of diastolic congestive heart failure, aortic stenosis, hypertension, hyperlipidemia, diet-controlled diabetes, obstructive sleep apnea on CPAP, GERD, hypothyroidism, and CKD stage IV who presented to the [facility] ED on 06/09/2018 after suffering a fall. Cardiology was consulted for evaluation of atrial fibrillation with RVR.   Assessment & Plan    New onset atrial fibrillation with RVR: Amiodarone for rate control.  No anticoagulation.    Acute on Chronic diastolic CHF: Started IV yesterday.  I will continue this today. Incontinent of urine so I/Os incomplete.  Weight appears to be up but edema is down in her legs on exam.   CKD IV: Creat is stable but elevated. Madison Riggs  7:45 AM  06/16/2018  For questions or updates, please contact   Please consult [email or web address] for contact info under Cardiology/STEMI.

## 2018-06-16 NOTE — Care Management Important Message (Signed)
Important Message  Patient Details  Name: Madison Riggs MRN: [id number] Date of Birth: [birth year]   Medicare Important Message Given:  Yes    Roshan Salamon P [place] 06/16/2018, 4:17 PM

## 2018-06-16 NOTE — Social Work (Signed)
Received call back from [facility] admissions. They will submit for patient's [facility] authorization today. Awaiting [employer] approval. CSW to follow.  Estanislado Emms, LCSW [phone]

## 2018-06-16 NOTE — Progress Notes (Signed)
PROGRESS NOTE  Madison Riggs GGY:[id number] DOB: 10/05/1927 DOA: 06/09/2018 PCP: Rolene Course, PA-C   LOS: 3 days   Brief Narrative / Interim history: 85 year old female with history of hypertension, hyperlipidemia, diet-controlled diabetes mellitus, hypothyroidism, chronic kidney disease stage IV, sleep apnea on CPAP, diastolic CHF who was admitted to the hospital after having a fall.  She has also been complaining of intermittent shortness of breath, and also reports intermittent rectal bleeding for "quite some time".  She was found to have new onset of A. fib with RVR with worsening anemia with a hemoglobin of 7.0, and worsening creatinine.  She was admitted to the hospitalist service  Subjective: -Denies any chest pain or palpitation today, dyspnea has improved .  Assessment & Plan: Principal Problem:   Atrial fibrillation with RVR (HCC) Active Problems:   Iron deficiency anemia   Hypertension   Type II diabetes mellitus with renal manifestations (HCC)   Chronic diastolic HF (heart failure) (HCC)   CKD (chronic kidney disease), stage IV (HCC)   New onset atrial fibrillation (HCC)   HLD (hyperlipidemia)   GERD (gastroesophageal reflux disease)   Hypothyroidism   Fall   Positive occult stool blood test   New onset A. fib with RVR -Cardiology consulted, appreciate input. Soft blood pressure limits beta-blocker and calcium channel blocker, started on IV amiodarone 12/28 -2D echo done , results below -Amiodarone has been switched to p.o. on 12/29 per cardiology.  Appreciate cardiology follow-up -Telemetry shows rates 100-110  Acute on chronic diastolic CHF -2D echo done this admission showing preserved EF, unable to evaluate for diastolic function, but she is with known grade 2 diastolic dysfunction on echo 2018 -Patient with signs of volume overload, she is back on IV diuresis yesterday, with improvement in volume status, but still significant lower extremity edema,  continue with IV Lasix today, diuresis per cardiology. -Continue with daily weight, strict ins and outs, low-salt diet  Falls -Probably unsafe to go back home given how frail she is especially that she may not have supervision, discussed with Education officer, museum, will pursue SNF -Patient agreeable  Iron deficiency anemia/positive fecal occult -Discussed with Dr. Paulita Fujita, for now conservative management -Not a candidate for anticoagulation for A. fib -Status post 2 units of packed red blood cells on 12/27, hemoglobin has overall remained stable.  No significant clinical bleeding -Use IV iron today, as well known CKD, likely will benefit from Procrit and IV iron as an outpatient, she is following with Dr. Clover Mealy as an outpatient, will discuss with her before discharge.  Hypothyroidism -TSH within normal limits at 2.5, continue levothyroxine  Acute kidney injury on chronic kidney disease -Baseline creatinine around 1.5, was 1.8 on admission, peaked at 2.1, continue to monitor closely especially in the setting of IV diuresis, so far remained stable on IV Lasix.  Hypertension -For now hold antihypertensives as she is hypotensive  Type 2 diabetes mellitus, diet controlled -Fasting 97 this morning, continue to monitor   Hyperlipidemia -Continue home medications  Scheduled Meds: . allopurinol  100 mg Oral BID  . amiodarone  200 mg Oral BID  . calcium carbonate  1 tablet Oral Q breakfast  . ferrous sulfate  325 mg Oral Daily  . fluticasone  1 spray Each Nare Daily  . furosemide  40 mg Intravenous BID  . heparin injection (subcutaneous)  5,000 Units Subcutaneous Q8H  . lactobacillus acidophilus & bulgar  1 tablet Oral Daily  . levothyroxine  100 mcg Oral Q0600  . loratadine  10 mg Oral Daily  . magnesium oxide  400 mg Oral Daily  . multivitamin with minerals  1 tablet Oral Daily  . pantoprazole  40 mg Oral BID  . senna-docusate  3 tablet Oral BID  . simvastatin  20 mg Oral Daily    . traZODone  100 mg Oral QHS  . venlafaxine XR  75 mg Oral Daily  . vitamin B-12  5,000 mcg Oral Daily   Continuous Infusions:  PRN Meds:.acetaminophen **OR** acetaminophen, hydrALAZINE, hydrOXYzine, zolpidem  DVT prophylaxis: SCDs, [place] heparin Code Status: Full code Family Communication: Son at bedside Disposition Plan: SNF  Consultants:   Cardiology  Gastroenterology  Procedures:   2D echo Study Conclusions - Left ventricle: The cavity size was normal. Wall thickness was normal. Systolic function was vigorous. The estimated ejection fraction was in the range of 65% to 70%. Wall motion was normal; there were no regional wall motion abnormalities. The study was not technically sufficient to allow evaluation of LV diastolic dysfunction due to atrial fibrillation. Doppler parameters are consistent with high ventricular filling pressure. - Aortic valve: Valve mobility was restricted. There was mild to moderate stenosis. Valve area (VTI): 1.04 cm^2. Valve area (Vmax): 0.95 cm^2. Valve area (Vmean): 0.97 cm^2. - Mitral valve: Severely calcified annulus. Mildly thickened leaflets . Mild diffuse calcification, with mild involvement of chords. There was moderate regurgitation directed centrally. Valve area by continuity equation (using LVOT flow): 1.52 cm^2. - Left atrium: The atrium was mildly dilated. - Right atrium: The atrium was mildly dilated. - Atrial septum: No defect or patent foramen ovale was identified. - Tricuspid valve: There was moderate regurgitation. - Pulmonary arteries: Systolic pressure was mildly increased. PA peak pressure: 36 mm Hg (S).    Antimicrobials:  None    Objective: Vitals:   06/15/18 1108 06/15/18 2058 06/16/18 0518 06/16/18 1101  BP: 90/62 (!) 96/59 97/63 (!) 90/58  Pulse: 87 74 75 87  Resp: 18   (!) 22  Temp: 98.8 F (37.1 C) 99.7 F (37.6 C) 99.1 F (37.3 C) 98 F (36.7 C)  TempSrc: Oral Oral Oral Oral  SpO2: 95% 97% 97% 97%  Weight:    71.2 kg   Height:        Intake/Output Summary (Last 24 hours) at 06/16/2018 1314 Last data filed at 06/16/2018 0745 Gross per 24 hour  Intake 909.11 ml  Output 300 ml  Net 609.11 ml   Filed Weights   06/14/18 0539 06/15/18 0601 06/16/18 0518  Weight: 66.4 kg 71 kg 71.2 kg    Examination:  Awake alert oriented x3, frail, chronically ill-appearing no new F.N deficits, Normal affect Symmetrical Chest wall movement, Good air movement bilaterally, CTAB Regular rate and rhythm,No Gallops,Rubs or new Murmurs, No Parasternal Heave +ve B.Sounds, Abd Soft, No tenderness, No rebound - guarding or rigidity. No Cyanosis, Clubbing , No new Rash or bruise, still with  significant edema, but it did improve   Data Reviewed: I have independently reviewed following labs and imaging studies   CBC: Recent Labs  Lab 06/12/18 0507 06/13/18 0423 06/14/18 0402 06/15/18 0401 06/16/18 0335  WBC 7.0 7.3 6.7 7.2 6.4  HGB 8.8* 8.8* 9.0* 8.9* 8.4*  HCT 28.9* 28.1* 28.8* 28.7* 26.8*  MCV 94.1 94.9 95.0 95.3 94.7  PLT 188 194 192 199 323   Basic Metabolic Panel: Recent Labs  Lab 06/10/18 0203  06/12/18 0507 06/13/18 0423 06/14/18 0402 06/15/18 0401 06/16/18 0335  NA  --    < > 134*  135 137 135 134*  K  --    < > 3.7 3.7 4.3 4.4 4.2  CL  --    < > 101 102 102 102 103  CO2  --    < > 22 24 27 27 23   GLUCOSE  --    < > 117* 113* 112* 116* 171*  BUN  --    < > 49* 51* 58* 61* 65*  CREATININE  --    < > 1.86* 1.84* 2.11* 1.98* 1.95*  CALCIUM  --    < > 8.1* 7.9* 8.1* 8.2* 7.9*  MG 2.5*  --   --   --   --   --   --    < > = values in this interval not displayed.   GFR: Estimated Creatinine Clearance: 18.4 mL/min (A) (by C-G formula based on SCr of 1.95 mg/dL (H)). Liver Function Tests: Recent Labs  Lab 06/09/18 2242  AST 34  ALT 21  ALKPHOS 85  BILITOT 0.5  PROT 6.5  ALBUMIN 2.5*   No results for input(s): LIPASE, AMYLASE in the last 168 hours. No results for input(s): AMMONIA in the  last 168 hours. Coagulation Profile: No results for input(s): INR, PROTIME in the last 168 hours. Cardiac Enzymes: Recent Labs  Lab 06/10/18 0203 06/10/18 0734 06/10/18 1411  TROPONINI <0.03 <0.03 <0.03   BNP (last 3 results) No results for input(s): PROBNP in the last 8760 hours. HbA1C: No results for input(s): HGBA1C in the last 72 hours. CBG: Recent Labs  Lab 06/13/18 0722 06/13/18 1121 06/14/18 0745 06/15/18 0759 06/16/18 0549  GLUCAP 112* 168* 97 97 123*   Lipid Profile: No results for input(s): CHOL, HDL, LDLCALC, TRIG, CHOLHDL, LDLDIRECT in the last 72 hours. Thyroid Function Tests: No results for input(s): TSH, T4TOTAL, FREET4, T3FREE, THYROIDAB in the last 72 hours. Anemia Panel: No results for input(s): VITAMINB12, FOLATE, FERRITIN, TIBC, IRON, RETICCTPCT in the last 72 hours. Urine analysis:    Component Value Date/Time   COLORURINE YELLOW 06/10/2018 1416   APPEARANCEUR CLEAR 06/10/2018 1416   LABSPEC 1.015 06/10/2018 1416   PHURINE 5.0 06/10/2018 1416   GLUCOSEU NEGATIVE 06/10/2018 1416   HGBUR NEGATIVE 06/10/2018 1416   BILIRUBINUR NEGATIVE 06/10/2018 1416   KETONESUR NEGATIVE 06/10/2018 1416   PROTEINUR NEGATIVE 06/10/2018 1416   NITRITE NEGATIVE 06/10/2018 1416   LEUKOCYTESUR NEGATIVE 06/10/2018 1416   Sepsis Labs: Invalid input(s): PROCALCITONIN, LACTICIDVEN  No results found for this or any previous visit (from the past 240 hour(s)).    Radiology Studies: No results found.   Phillips Climes , MD,  [facility] Pager [phone]  If 7PM-7AM, please contact night-coverage [email or web address] Password [email or web address] 06/16/2018, 1:14 PM

## 2018-06-17 ENCOUNTER — Encounter (HOSPITAL_COMMUNITY): Payer: Self-pay | Admitting: Student

## 2018-06-17 ENCOUNTER — Other Ambulatory Visit: Payer: Self-pay

## 2018-06-17 ENCOUNTER — Inpatient Hospital Stay (HOSPITAL_COMMUNITY): Payer: Medicare Other

## 2018-06-17 DIAGNOSIS — R188 Other ascites: Secondary | ICD-10-CM

## 2018-06-17 HISTORY — PX: IR PARACENTESIS: IMG2679

## 2018-06-17 LAB — BASIC METABOLIC PANEL
Anion gap: 8 (ref 5–15)
BUN: 63 mg/dL — ABNORMAL HIGH (ref 8–23)
CO2: 24 mmol/L (ref 22–32)
Calcium: 8.4 mg/dL — ABNORMAL LOW (ref 8.9–10.3)
Chloride: 104 mmol/L (ref 98–111)
Creatinine, Ser: 1.86 mg/dL — ABNORMAL HIGH (ref 0.44–1.00)
GFR calc Af Amer: 28 mL/min — ABNORMAL LOW (ref >60)
GFR calc non Af Amer: 24 mL/min — ABNORMAL LOW (ref >60)
GLUCOSE: 106 mg/dL — AB (ref 70–99)
Potassium: 4.3 mmol/L (ref 3.5–5.1)
Sodium: 136 mmol/L (ref 135–145)

## 2018-06-17 LAB — BODY FLUID CELL COUNT WITH DIFFERENTIAL
Eos, Fluid: 0 %
LYMPHS FL: 12 %
MONOCYTE-MACROPHAGE-SEROUS FLUID: 74 % (ref 50–90)
Neutrophil Count, Fluid: 14 % (ref 0–25)
Total Nucleated Cell Count, Fluid: 169 cu mm (ref 0–1000)

## 2018-06-17 LAB — CBC
HCT: 28.7 % — ABNORMAL LOW (ref 36.0–46.0)
HEMOGLOBIN: 8.7 g/dL — AB (ref 12.0–15.0)
MCH: 28.8 pg (ref 26.0–34.0)
MCHC: 30.3 g/dL (ref 30.0–36.0)
MCV: 95 fL (ref 80.0–100.0)
Platelets: 196 10*3/uL (ref 150–400)
RBC: 3.02 MIL/uL — ABNORMAL LOW (ref 3.87–5.11)
RDW: 15.9 % — ABNORMAL HIGH (ref 11.5–15.5)
WBC: 6.7 10*3/uL (ref 4.0–10.5)
nRBC: 0 % (ref 0.0–0.2)

## 2018-06-17 LAB — GLUCOSE, CAPILLARY: Glucose-Capillary: 104 mg/dL — ABNORMAL HIGH (ref 70–99)

## 2018-06-17 MED ORDER — LIDOCAINE HCL 1 % IJ SOLN
INTRAMUSCULAR | Status: AC
  Filled 2018-06-17: qty 20

## 2018-06-17 MED ORDER — FUROSEMIDE 10 MG/ML IJ SOLN: 60.0000 mg | Freq: Two times a day (BID) | INTRAMUSCULAR | Status: DC

## 2018-06-17 MED ORDER — ALBUMIN HUMAN 25 % IV SOLN
12.5000 g | Freq: Once | INTRAVENOUS | Status: AC
  Administered 2018-06-17: 12.5 g via INTRAVENOUS
  Filled 2018-06-17: qty 200

## 2018-06-17 MED ORDER — FUROSEMIDE 10 MG/ML IJ SOLN
60.0000 mg | Freq: Two times a day (BID) | INTRAMUSCULAR | Status: DC
  Administered 2018-06-18: 60 mg via INTRAVENOUS
  Filled 2018-06-17: qty 6

## 2018-06-17 MED ORDER — LIDOCAINE HCL 1 % IJ SOLN
INTRAMUSCULAR | Status: DC | PRN
  Administered 2018-06-17: 10 mL

## 2018-06-17 MED ORDER — FUROSEMIDE 10 MG/ML IJ SOLN
20.0000 mg | Freq: Once | INTRAMUSCULAR | Status: AC
  Administered 2018-06-17: 20 mg via INTRAVENOUS
  Filled 2018-06-17: qty 2

## 2018-06-17 NOTE — Progress Notes (Signed)
Pt refuses cpap

## 2018-06-17 NOTE — Progress Notes (Signed)
Progress Note  Patient Name: Madison Riggs Date of Encounter: 06/17/2018  Primary Cardiologist: Minus Breeding, MD   Subjective   Ambulated in hallway and did better today.  Rate stayed about 102.  Still not at previous baseline  Inpatient Medications    Scheduled Meds: . allopurinol  100 mg Oral BID  . amiodarone  200 mg Oral BID  . calcium carbonate  1 tablet Oral Q breakfast  . ferrous sulfate  325 mg Oral Daily  . fluticasone  1 spray Each Nare Daily  . furosemide  40 mg Intravenous BID  . heparin injection (subcutaneous)  5,000 Units Subcutaneous Q8H  . lactobacillus acidophilus & bulgar  1 tablet Oral Daily  . levothyroxine  100 mcg Oral Q0600  . loratadine  10 mg Oral Daily  . magnesium oxide  400 mg Oral Daily  . multivitamin with minerals  1 tablet Oral Daily  . pantoprazole  40 mg Oral BID  . senna-docusate  3 tablet Oral BID  . simvastatin  20 mg Oral Daily  . traZODone  100 mg Oral QHS  . venlafaxine XR  75 mg Oral Daily  . vitamin B-12  5,000 mcg Oral Daily   Continuous Infusions:  PRN Meds: acetaminophen **OR** acetaminophen, hydrALAZINE, hydrOXYzine, zolpidem   Vital Signs    Vitals:   06/16/18 0518 06/16/18 1101 06/16/18 2033 06/17/18 0550  BP: 97/63 (!) 90/58 (!) 89/64 (!) 92/57  Pulse: 75 87 79 77  Resp:  (!) 22  16  Temp: 99.1 F (37.3 C) 98 F (36.7 C) 98.3 F (36.8 C) 98 F (36.7 C)  TempSrc: Oral Oral Oral Oral  SpO2: 97% 97% 97% 98%  Weight: 71.2 kg   71.1 kg  Height:        Intake/Output Summary (Last 24 hours) at 06/17/2018 1043 Last data filed at 06/17/2018 0900 Gross per 24 hour  Intake 1062 ml  Output 1550 ml  Net -488 ml   Filed Weights   06/15/18 0601 06/16/18 0518 06/17/18 0550  Weight: 71 kg 71.2 kg 71.1 kg    Physical Exam   GEN: No  acute distress.   Neck: No JVD Cardiac: IrregularRR, no murmurs, rubs, or gallops.  Respiratory: Clear   to auscultation bilaterally. GI: Soft, nontender, distended, normal  bowel sounds  MS:  Moderate edema; No deformity. Neuro:   Nonfocal  Psych: Oriented and appropriate     Labs    Chemistry Recent Labs  Lab 06/15/18 0401 06/16/18 0335 06/17/18 0843  NA 135 134* 136  K 4.4 4.2 4.3  CL 102 103 104  CO2 27 23 24   GLUCOSE 116* 171* 106*  BUN 61* 65* 63*  CREATININE 1.98* 1.95* 1.86*  CALCIUM 8.2* 7.9* 8.4*  GFRNONAA 22* 22* 24*  GFRAA 26* 26* 28*  ANIONGAP 6 8 8      Hematology Recent Labs  Lab 06/15/18 0401 06/16/18 0335 06/17/18 0843  WBC 7.2 6.4 6.7  RBC 3.01* 2.83* 3.02*  HGB 8.9* 8.4* 8.7*  HCT 28.7* 26.8* 28.7*  MCV 95.3 94.7 95.0  MCH 29.6 29.7 28.8  MCHC 31.0 31.3 30.3  RDW 16.2* 15.9* 15.9*  PLT 199 196 196    Cardiac Enzymes Recent Labs  Lab 06/10/18 1411  TROPONINI <0.03    No results for input(s): TROPIPOC in the last 168 hours.   BNP No results for input(s): BNP, PROBNP in the last 168 hours.   DDimer No results for input(s): DDIMER in the  last 168 hours.   Radiology    No results found.  Telemetry    Atrial fib with controlled rate.   - Personally Reviewed  ECG    NA  - Personally Reviewed  Cardiac Studies   Echocardiogram 06/12/2018: Study Conclusions  - Left ventricle: The cavity size was normal. Wall thickness was   normal. Systolic function was vigorous. The estimated ejection   fraction was in the range of 65% to 70%. Wall motion was normal;   there were no regional wall motion abnormalities. The study was   not technically sufficient to allow evaluation of LV diastolic   dysfunction due to atrial fibrillation. Doppler parameters are   consistent with high ventricular filling pressure. - Aortic valve: Valve mobility was restricted. There was mild to   moderate stenosis. Valve area (VTI): 1.04 cm^2. Valve area   (Vmax): 0.95 cm^2. Valve area (Vmean): 0.97 cm^2. - Mitral valve: Severely calcified annulus. Mildly thickened   leaflets . Mild diffuse calcification, with mild involvement  of   chords. There was moderate regurgitation directed centrally.   Valve area by continuity equation (using LVOT flow): 1.52 cm^2. - Left atrium: The atrium was mildly dilated. - Right atrium: The atrium was mildly dilated. - Atrial septum: No defect or patent foramen ovale was identified. - Tricuspid valve: There was moderate regurgitation. - Pulmonary arteries: Systolic pressure was mildly increased. PA   peak pressure: 36 mm Hg (S).  Patient Profile     85 y.o. female with a history of diastolic congestive heart failure, aortic stenosis, hypertension, hyperlipidemia, diet-controlled diabetes, obstructive sleep apnea on CPAP, GERD, hypothyroidism, and CKD stage IV who presented to [facility] ED on 06/09/2018 after suffering a fall. Cardiology was consulted for evaluation of atrial fibrillation with RVR.   Assessment & Plan    New onset atrial fibrillation with RVR: Amiodarone for rate control.  No anticoagulation.   Continue PO bid amiodarone for two weeks then once daily.  She seems to be tolerating diuresis well and I will increase slightly today.    Acute on Chronic diastolic CHF: Tolerating IV diuresis.  Creat is stable.  I will increase as above.  In addition she is going to have an abdominal ultrasound and might need paracentesis for symptomatic treatment if there is ascites.   CKD IV: Creat is stable but elevated. Minus Breeding  10:43 AM  06/17/2018  For questions or updates, please contact   Please consult [email or web address] for contact info under Cardiology/STEMI.

## 2018-06-17 NOTE — Progress Notes (Signed)
PROGRESS NOTE  AUTYM SIESS OJJ:[id number] DOB: [birth year] DOA: 06/09/2018 PCP: Rolene Course, PA-C   LOS: 4 days   Brief Narrative / Interim history: 85 year old female with history of hypertension, hyperlipidemia, diet-controlled diabetes mellitus, hypothyroidism, chronic kidney disease stage IV, sleep apnea on CPAP, diastolic CHF who was admitted to the hospital after having a fall.  She has also been complaining of intermittent shortness of breath, and also reports intermittent rectal bleeding for "quite some time".  She was found to have new onset of A. fib with RVR with worsening anemia with a hemoglobin of 7.0, and worsening creatinine.  She was admitted to the hospitalist service  Subjective: -Denies any chest pain or palpitation today, dyspnea has improved .  Assessment & Plan: Principal Problem:   Atrial fibrillation with RVR (HCC) Active Problems:   Iron deficiency anemia   Hypertension   Type II diabetes mellitus with renal manifestations (HCC)   Chronic diastolic HF (heart failure) (HCC)   CKD (chronic kidney disease), stage IV (HCC)   New onset atrial fibrillation (HCC)   HLD (hyperlipidemia)   GERD (gastroesophageal reflux disease)   Hypothyroidism   Fall   Positive occult stool blood test   New onset A. fib with RVR -Cardiology consulted, appreciate input. Soft blood pressure limits beta-blocker and calcium channel blocker, started on IV amiodarone 12/28 -2D echo done , results below -Amiodarone has been switched to p.o. on 12/29 per cardiology.  Appreciate cardiology follow-up -Telemetry shows rates 100-110  Acute on chronic diastolic CHF -2D echo done this admission showing preserved EF, unable to evaluate for diastolic function, but she is with known grade 2 diastolic dysfunction on echo 2018 -Diuresis per cardiology, on IV Lasix, renal function and blood pressure tolerating very well, increased to 60 mg IV twice daily today, I will hold evening dose as  he had large volume paracentesis today  -Continue with daily weight, strict ins and outs, low-salt diet  Falls -Probably unsafe to go back home given how frail she is especially that she may not have supervision, discussed with Education officer, museum, will pursue SNF -Patient agreeable  Ascitis  -Patient with history of recurrent ascites, he had paracentesis last year, she was noted to have ascites today, she had paracentesis done with 6.2 L drained, will give albumin.  Iron deficiency anemia/positive fecal occult -Discussed with Dr. Paulita Fujita, for now conservative management -Not a candidate for anticoagulation for A. fib -Status post 2 units of packed red blood cells on 12/27, hemoglobin has overall remained stable.  No significant clinical bleeding -Use IV iron today, as well known CKD, likely will benefit from Procrit and IV iron as an outpatient, she is following with Dr. Clover Mealy as an outpatient, will discuss with her before discharge.  Hypothyroidism -TSH within normal limits at 2.5, continue levothyroxine  Acute kidney injury on chronic kidney disease -Baseline creatinine around 1.5, was 1.8 on admission, peaked at 2.1, continue to monitor closely especially in the setting of IV diuresis, so far remained stable on IV Lasix.  Hypertension -For now hold antihypertensives as she is hypotensive  Type 2 diabetes mellitus, diet controlled -Fasting 97 this morning, continue to monitor   Hyperlipidemia -Continue home medications  Scheduled Meds: . allopurinol  100 mg Oral BID  . amiodarone  200 mg Oral BID  . calcium carbonate  1 tablet Oral Q breakfast  . ferrous sulfate  325 mg Oral Daily  . fluticasone  1 spray Each Nare Daily  . [START ON 06/18/2018] furosemide  60 mg Intravenous BID  . heparin injection (subcutaneous)  5,000 Units Subcutaneous Q8H  . lactobacillus acidophilus & bulgar  1 tablet Oral Daily  . levothyroxine  100 mcg Oral Q0600  . lidocaine      . loratadine   10 mg Oral Daily  . magnesium oxide  400 mg Oral Daily  . multivitamin with minerals  1 tablet Oral Daily  . pantoprazole  40 mg Oral BID  . senna-docusate  3 tablet Oral BID  . simvastatin  20 mg Oral Daily  . traZODone  100 mg Oral QHS  . venlafaxine XR  75 mg Oral Daily  . vitamin B-12  5,000 mcg Oral Daily   Continuous Infusions:  PRN Meds:.acetaminophen **OR** acetaminophen, hydrALAZINE, hydrOXYzine, lidocaine, zolpidem  DVT prophylaxis: SCDs, [place] heparin Code Status: Full code Family Communication: Son at bedside Disposition Plan: SNF  Consultants:   Cardiology  Gastroenterology  Procedures:   2D echo Study Conclusions - Left ventricle: The cavity size was normal. Wall thickness was normal. Systolic function was vigorous. The estimated ejection fraction was in the range of 65% to 70%. Wall motion was normal; there were no regional wall motion abnormalities. The study was not technically sufficient to allow evaluation of LV diastolic dysfunction due to atrial fibrillation. Doppler parameters are consistent with high ventricular filling pressure. - Aortic valve: Valve mobility was restricted. There was mild to moderate stenosis. Valve area (VTI): 1.04 cm^2. Valve area (Vmax): 0.95 cm^2. Valve area (Vmean): 0.97 cm^2. - Mitral valve: Severely calcified annulus. Mildly thickened leaflets . Mild diffuse calcification, with mild involvement of chords. There was moderate regurgitation directed centrally. Valve area by continuity equation (using LVOT flow): 1.52 cm^2. - Left atrium: The atrium was mildly dilated. - Right atrium: The atrium was mildly dilated. - Atrial septum: No defect or patent foramen ovale was identified. - Tricuspid valve: There was moderate regurgitation. - Pulmonary arteries: Systolic pressure was mildly increased. PA peak pressure: 36 mm Hg (S).    Antimicrobials:  None    Objective: Vitals:   06/17/18 0550 06/17/18 1148 06/17/18 1326 06/17/18 1431    BP: (!) 92/57 (!) 94/55 96/65 (!) 98/54  Pulse: 77 87    Resp: 16 18    Temp: 98 F (36.7 C) (!) 97.5 F (36.4 C)    TempSrc: Oral Oral    SpO2: 98% 99%    Weight: 71.1 kg     Height:        Intake/Output Summary (Last 24 hours) at 06/17/2018 1636 Last data filed at 06/17/2018 1500 Gross per 24 hour  Intake 840 ml  Output 7600 ml  Net -6760 ml   Filed Weights   06/15/18 0601 06/16/18 0518 06/17/18 0550  Weight: 71 kg 71.2 kg 71.1 kg    Examination:  Awake Alert, Oriented X 3, No new F.N deficits, Normal affect, frail Symmetrical Chest wall movement, Good air movement bilaterally, CTAB Irregular irregular,No Gallops,Rubs or new Murmurs, No Parasternal Heave +ve B.Sounds, distended with ascites, no rebound - guarding or rigidity. No Cyanosis, Clubbing or edema, No new Rash or bruise      Data Reviewed: I have independently reviewed following labs and imaging studies   CBC: Recent Labs  Lab 06/13/18 0423 06/14/18 0402 06/15/18 0401 06/16/18 0335 06/17/18 0843  WBC 7.3 6.7 7.2 6.4 6.7  HGB 8.8* 9.0* 8.9* 8.4* 8.7*  HCT 28.1* 28.8* 28.7* 26.8* 28.7*  MCV 94.9 95.0 95.3 94.7 95.0  PLT 194 192 199 196 196  Basic Metabolic Panel: Recent Labs  Lab 06/13/18 0423 06/14/18 0402 06/15/18 0401 06/16/18 0335 06/17/18 0843  NA 135 137 135 134* 136  K 3.7 4.3 4.4 4.2 4.3  CL 102 102 102 103 104  CO2 24 27 27 23 24   GLUCOSE 113* 112* 116* 171* 106*  BUN 51* 58* 61* 65* 63*  CREATININE 1.84* 2.11* 1.98* 1.95* 1.86*  CALCIUM 7.9* 8.1* 8.2* 7.9* 8.4*   GFR: Estimated Creatinine Clearance: 19.3 mL/min (A) (by C-G formula based on SCr of 1.86 mg/dL (H)). Liver Function Tests: No results for input(s): AST, ALT, ALKPHOS, BILITOT, PROT, ALBUMIN in the last 168 hours. No results for input(s): LIPASE, AMYLASE in the last 168 hours. No results for input(s): AMMONIA in the last 168 hours. Coagulation Profile: No results for input(s): INR, PROTIME in the last 168  hours. Cardiac Enzymes: No results for input(s): CKTOTAL, CKMB, CKMBINDEX, TROPONINI in the last 168 hours. BNP (last 3 results) No results for input(s): PROBNP in the last 8760 hours. HbA1C: No results for input(s): HGBA1C in the last 72 hours. CBG: Recent Labs  Lab 06/13/18 1121 06/14/18 0745 06/15/18 0759 06/16/18 0549 06/17/18 0741  GLUCAP 168* 97 97 123* 104*   Lipid Profile: No results for input(s): CHOL, HDL, LDLCALC, TRIG, CHOLHDL, LDLDIRECT in the last 72 hours. Thyroid Function Tests: No results for input(s): TSH, T4TOTAL, FREET4, T3FREE, THYROIDAB in the last 72 hours. Anemia Panel: No results for input(s): VITAMINB12, FOLATE, FERRITIN, TIBC, IRON, RETICCTPCT in the last 72 hours. Urine analysis:    Component Value Date/Time   COLORURINE YELLOW 06/10/2018 1416   APPEARANCEUR CLEAR 06/10/2018 1416   LABSPEC 1.015 06/10/2018 1416   PHURINE 5.0 06/10/2018 1416   GLUCOSEU NEGATIVE 06/10/2018 1416   HGBUR NEGATIVE 06/10/2018 1416   BILIRUBINUR NEGATIVE 06/10/2018 1416   KETONESUR NEGATIVE 06/10/2018 1416   PROTEINUR NEGATIVE 06/10/2018 1416   NITRITE NEGATIVE 06/10/2018 1416   LEUKOCYTESUR NEGATIVE 06/10/2018 1416   Sepsis Labs: Invalid input(s): PROCALCITONIN, LACTICIDVEN  No results found for this or any previous visit (from the past 240 hour(s)).    Radiology Studies: Ir Paracentesis  Result Date: 06/17/2018 INDICATION: Patient with history of chronic diastolic HF, CKD stage 4, abdominal distension, and ascites. Request is made for diagnostic and therapeutic paracentesis. EXAM: ULTRASOUND GUIDED DIAGNOSTIC AND THERAPEUTIC PARACENTESIS MEDICATIONS: 10 mL 1% lidocaine COMPLICATIONS: None immediate. PROCEDURE: Informed written consent was obtained from the patient after a discussion of the risks, benefits and alternatives to treatment. A timeout was performed prior to the initiation of the procedure. Initial ultrasound scanning demonstrates a moderate amount of  ascites within the left lower abdominal quadrant. The left lower abdomen was prepped and draped in the usual sterile fashion. 1% lidocaine was used for local anesthesia. Following this, a 19 gauge, 7-cm, Yueh catheter was introduced. An ultrasound image was saved for documentation purposes. The paracentesis was performed. The catheter was removed and a dressing was applied. The patient tolerated the procedure well without immediate post procedural complication. FINDINGS: A total of approximately 6.2 L of clear gold fluid was removed. Samples were sent to the laboratory as requested by the clinical team. IMPRESSION: Successful ultrasound-guided paracentesis yielding 6.2 L of peritoneal fluid. Read by: Earley Abide, PA-C Electronically Signed   By: Aletta Edouard M.D.   On: 06/17/2018 14:36     Phillips Climes , MD,  [facility] Pager [phone]  If 7PM-7AM, please contact night-coverage [email or web address] Password [facility] 06/17/2018, 4:36 PM

## 2018-06-17 NOTE — Social Work (Addendum)
3:51 pm [facility] can accept patient if medically ready on Saturday. Discharge summary will need to be faxed to [phone]. The facility will not accept admissions on Sunday due to their pharmacy being closed. CSW to follow.  10:53 am Patient has [facility] authorization to admit to SNF. CSW to follow for medical readiness and will support with discharge planning.  Estanislado Emms, LCSW [phone]

## 2018-06-17 NOTE — Progress Notes (Signed)
Occupational Therapy Treatment Patient Details Name: Madison Riggs MRN: [id number] DOB: [birth year] Today's Date: 06/17/2018    History of present illness Madison Riggs is a 85 y.o. female with medical history significant of hypertension, hyperlipidemia, diet-controlled diabetes, GERD, hypothyroidism, gout, aortic stenosis, CKD 4, OSA on CPAP, iron deficiency anemia, mild dementia, dCHF, gastric bypass surgery, presents with fall.   OT comments  Pt agreeable to therapy session. Pt performing bed mobility with minA/modA for trunk elevation due to low back pain. Pt scooting to EOB with supervisionA. Pt performing sit to stands with minA from elevated bed and modA from commode. Pt performing ADL functional mobility with RW and minA for stability. Pt requiring modA for toilet hygiene and clothing management. Pt performing grooming at sink with supervisionA and leaning on one UE for support and energy conservation. Pt is not able to properly care for self at home and continues to require increased caregiver assist. HR remained 90-105 BPm throughout session.  Pt would benefit from continued OT skilled services for ADL, mobility and safety in SNF setting.   Follow Up Recommendations  SNF;Supervision/Assistance - 24 hour    Equipment Recommendations  None recommended by OT    Recommendations for Other Services      Precautions / Restrictions Precautions Precautions: Fall Restrictions Weight Bearing Restrictions: No       Mobility Bed Mobility Overal bed mobility: Needs Assistance Bed Mobility: Supine to Sit     Supine to sit: Min assist;Mod assist Sit to supine: Min guard;HOB elevated   General bed mobility comments: min A for trunk stability;Pt used bed rails to assist  Transfers Overall transfer level: Needs assistance Equipment used: Rolling walker (2 wheeled) Transfers: Sit to/from Stand Sit to Stand: Min assist;Mod assist Stand pivot transfers: Min assist;From elevated  surface       General transfer comment: hands on for safety and balance    Balance Overall balance assessment: Needs assistance Sitting-balance support: No upper extremity supported;Feet supported Sitting balance-Leahy Scale: Good       Standing balance-Leahy Scale: Fair                             ADL either performed or assessed with clinical judgement   ADL Overall ADL's : Needs assistance/impaired     Grooming: Oral care;Wash/dry face;Wash/dry hands;Min guard;Cueing for safety;Standing           Upper Body Dressing : Set up;Standing   Lower Body Dressing: Maximal assistance;Sit to/from stand Lower Body Dressing Details (indicate cue type and reason): standing at commode; pt requiring assist for LB dressing due to poor balance Toilet Transfer: Moderate assistance;Cueing for safety;RW;Grab bars   Toileting- Clothing Manipulation and Hygiene: Moderate assistance;Sitting/lateral lean;Sit to/from stand       Functional mobility during ADLs: Min guard;Rolling walker General ADL Comments: Pt with frequent falls, decreased balance, decreased safety awareness     Vision   Vision Assessment?: No apparent visual deficits   Perception     Praxis      Cognition Arousal/Alertness: Awake/alert Behavior During Therapy: WFL for tasks assessed/performed Overall Cognitive Status: Within Functional Limits for tasks assessed                                 General Comments: Pt following all commands        Exercises     Shoulder Instructions  General Comments HR <105 BPM    Pertinent Vitals/ Pain       Pain Assessment: Faces Faces Pain Scale: Hurts a little bit Pain Location: lower back (chronic) Pain Descriptors / Indicators: Aching Pain Intervention(s): Repositioned  Home Living                                          Prior Functioning/Environment              Frequency  Min 2X/week         Progress Toward Goals  OT Goals(current goals can now be found in the care plan section)  Progress towards OT goals: Progressing toward goals  Acute Rehab OT Goals Patient Stated Goal: "get out more, I am lonely" OT Goal Formulation: With patient Time For Goal Achievement: 06/25/18 Potential to Achieve Goals: Good  Plan Discharge plan remains appropriate    Co-evaluation                 AM-PAC OT "6 Clicks" Daily Activity     Outcome Measure   Help from another person eating meals?: None Help from another person taking care of personal grooming?: A Little Help from another person toileting, which includes using toliet, bedpan, or urinal?: A Little Help from another person bathing (including washing, rinsing, drying)?: A Lot Help from another person to put on and taking off regular upper body clothing?: A Little Help from another person to put on and taking off regular lower body clothing?: A Lot 6 Click Score: 17    End of Session Equipment Utilized During Treatment: Gait belt;Rolling walker  OT Visit Diagnosis: Unsteadiness on feet (R26.81);Muscle weakness (generalized) (M62.81)   Activity Tolerance Patient tolerated treatment well   Patient Left in chair;with call bell/phone within reach;with chair alarm set   Nurse Communication Mobility status        Time: 0935-1000 OT Time Calculation (min): 25 min  Charges: OT General Charges $OT Visit: 1 Visit OT Treatments $Self Care/Home Management : 23-37 mins  Ebony Hail Harold Hedge) Marsa Aris OTR/L Acute Rehabilitation Services Pager: [phone] Office: [phone]    Fredda Hammed 06/17/2018, 10:35 AM

## 2018-06-17 NOTE — Progress Notes (Signed)
Physical Therapy Treatment Patient Details Name: Madison Riggs MRN: [id number] DOB: 09-Feb-1928 Today's Date: 06/17/2018    History of Present Illness Pt is an 85 y.o. female admitted 06/13/18 after falling. Pt with new onset a-fib with RVR and acute on chronic CHF. PMH includes HTN, HLD, DM, gout, CKD 4, OSA on CPAP, CHF, mild dementia.   PT Comments    Pt progressing with mobility. Currently requiring min guard to minA for mobility. Pt limited by generalized weakness, decreased activity tolerance and poor balance strategies. This and slowed gait speed place pt at increased risk for falls. Continue to recommend SNF-level therapies. Will follow acutely.  HR 106-136 with mobility    Follow Up Recommendations  SNF;Supervision/Assistance - 24 hour     Equipment Recommendations  None recommended by PT    Recommendations for Other Services       Precautions / Restrictions Precautions Precautions: Fall Restrictions Weight Bearing Restrictions: No    Mobility  Bed Mobility Overal bed mobility: Needs Assistance Bed Mobility: Sit to Supine     Supine to sit: Min assist;Mod assist Sit to supine: Min assist   General bed mobility comments: Return to supine with supervision; required minA to scoot up in bed, able to assist with BLEs  Transfers Overall transfer level: Needs assistance Equipment used: Rolling walker (2 wheeled) Transfers: Sit to/from Stand Sit to Stand: Min assist Stand pivot transfers: Min assist;From elevated surface       General transfer comment: MinA for trunk elevation standing from recliner; pt reliant on momentum and UE support  Ambulation/Gait Ambulation/Gait assistance: Min guard Gait Distance (Feet): 140 Feet Assistive device: Rolling walker (2 wheeled) Gait Pattern/deviations: Step-through pattern;Decreased stride length;Trunk flexed Gait velocity: 1.19 ft/sec Gait velocity interpretation: <1.31 ft/sec, indicative of household  ambulator General Gait Details: Slow, antalgic ambulation with c/o chronic back pain, min guard for balance. Frequent standing rest breaks secondary to fatigue and DOE. HR 106-138   Stairs             Wheelchair Mobility    Modified Rankin (Stroke Patients Only)       Balance Overall balance assessment: Needs assistance Sitting-balance support: No upper extremity supported;Feet supported Sitting balance-Leahy Scale: Good     Standing balance support: Single extremity supported Standing balance-Leahy Scale: Poor Standing balance comment: Reliant on at least single UE support with standing                            Cognition Arousal/Alertness: Awake/alert Behavior During Therapy: WFL for tasks assessed/performed Overall Cognitive Status: Within Functional Limits for tasks assessed                                 General Comments: WFL for simple tasks      Exercises      General Comments General comments (skin integrity, edema, etc.): HR <105 BPM      Pertinent Vitals/Pain Pain Assessment: Faces Faces Pain Scale: Hurts little more Pain Location: lower back (chronic) Pain Descriptors / Indicators: Aching;Constant Pain Intervention(s): Monitored during session;Limited activity within patient's tolerance    Home Living                      Prior Function            PT Goals (current goals can now be found in the care plan section) Acute  Rehab PT Goals Patient Stated Goal: "get out more, I am lonely" PT Goal Formulation: With patient Time For Goal Achievement: 06/25/18 Potential to Achieve Goals: Good Progress towards PT goals: Progressing toward goals    Frequency    Min 2X/week      PT Plan Current plan remains appropriate    Co-evaluation              AM-PAC PT "6 Clicks" Mobility   Outcome Measure  Help needed turning from your back to your side while in a flat bed without using bedrails?: A  Little Help needed moving from lying on your back to sitting on the side of a flat bed without using bedrails?: A Little Help needed moving to and from a bed to a chair (including a wheelchair)?: A Little Help needed standing up from a chair using your arms (e.g., wheelchair or bedside chair)?: A Little Help needed to walk in hospital room?: A Little Help needed climbing 3-5 steps with a railing? : A Lot 6 Click Score: 17    End of Session Equipment Utilized During Treatment: Gait belt Activity Tolerance: Patient tolerated treatment well;Patient limited by fatigue Patient left: in bed;with call bell/phone within reach;with bed alarm set Nurse Communication: Mobility status PT Visit Diagnosis: Difficulty in walking, not elsewhere classified (R26.2)     Time: 0370-4888 PT Time Calculation (min) (ACUTE ONLY): 20 min  Charges:  $Gait Training: 8-22 mins                    Mabeline Caras, PT, DPT Acute Rehabilitation Services  Pager [phone] Office [place] 06/17/2018, 12:45 PM

## 2018-06-17 NOTE — Procedures (Signed)
PROCEDURE SUMMARY:  Successful image-guided paracentesis from the left lower abdomen.  Yielded 6.2 liters of clear gold fluid.  No immediate complications.  Patient tolerated well.   Specimen was sent for labs.  Claris Pong Louk PA-C 06/17/2018 1:49 PM

## 2018-06-18 ENCOUNTER — Other Ambulatory Visit: Payer: Self-pay

## 2018-06-18 LAB — BASIC METABOLIC PANEL
Anion gap: 8 (ref 5–15)
BUN: 63 mg/dL — AB (ref 8–23)
CO2: 24 mmol/L (ref 22–32)
Calcium: 8.2 mg/dL — ABNORMAL LOW (ref 8.9–10.3)
Chloride: 105 mmol/L (ref 98–111)
Creatinine, Ser: 1.87 mg/dL — ABNORMAL HIGH (ref 0.44–1.00)
GFR calc non Af Amer: 24 mL/min — ABNORMAL LOW (ref >60)
GFR, EST AFRICAN AMERICAN: 27 mL/min — AB (ref >60)
Glucose, Bld: 99 mg/dL (ref 70–99)
Potassium: 4 mmol/L (ref 3.5–5.1)
Sodium: 137 mmol/L (ref 135–145)

## 2018-06-18 LAB — CBC WITH DIFFERENTIAL/PLATELET
Abs Immature Granulocytes: 0.03 10*3/uL (ref 0.00–0.07)
BASOS ABS: 0 10*3/uL (ref 0.0–0.1)
Basophils Relative: 1 %
EOS PCT: 2 %
Eosinophils Absolute: 0.2 10*3/uL (ref 0.0–0.5)
HCT: 29.5 % — ABNORMAL LOW (ref 36.0–46.0)
Hemoglobin: 9.2 g/dL — ABNORMAL LOW (ref 12.0–15.0)
Immature Granulocytes: 0 %
Lymphocytes Relative: 10 %
Lymphs Abs: 0.7 10*3/uL (ref 0.7–4.0)
MCH: 28.8 pg (ref 26.0–34.0)
MCHC: 31.2 g/dL (ref 30.0–36.0)
MCV: 92.5 fL (ref 80.0–100.0)
Monocytes Absolute: 0.8 10*3/uL (ref 0.1–1.0)
Monocytes Relative: 11 %
Neutro Abs: 5.3 10*3/uL (ref 1.7–7.7)
Neutrophils Relative %: 76 %
Platelets: 208 10*3/uL (ref 150–400)
RBC: 3.19 MIL/uL — ABNORMAL LOW (ref 3.87–5.11)
RDW: 16.2 % — ABNORMAL HIGH (ref 11.5–15.5)
WBC: 7 10*3/uL (ref 4.0–10.5)
nRBC: 0 % (ref 0.0–0.2)

## 2018-06-18 LAB — CBC
HCT: 24.3 % — ABNORMAL LOW (ref 36.0–46.0)
Hemoglobin: 7.4 g/dL — ABNORMAL LOW (ref 12.0–15.0)
MCH: 28.9 pg (ref 26.0–34.0)
MCHC: 30.5 g/dL (ref 30.0–36.0)
MCV: 94.9 fL (ref 80.0–100.0)
Platelets: 188 10*3/uL (ref 150–400)
RBC: 2.56 MIL/uL — AB (ref 3.87–5.11)
RDW: 16 % — AB (ref 11.5–15.5)
WBC: 7 10*3/uL (ref 4.0–10.5)
nRBC: 0 % (ref 0.0–0.2)

## 2018-06-18 LAB — GLUCOSE, CAPILLARY: Glucose-Capillary: 96 mg/dL (ref 70–99)

## 2018-06-18 LAB — PREPARE RBC (CROSSMATCH)

## 2018-06-18 MED ORDER — TORSEMIDE 20 MG PO TABS
40.0000 mg | ORAL_TABLET | Freq: Two times a day (BID) | ORAL | Status: DC
  Administered 2018-06-18 – 2018-06-19 (×3): 40 mg via ORAL
  Filled 2018-06-18 (×3): qty 2

## 2018-06-18 MED ORDER — SODIUM CHLORIDE 0.9% IV SOLUTION: Freq: Once | INTRAVENOUS | Status: DC

## 2018-06-18 NOTE — Progress Notes (Signed)
Progress Note  Patient Name: Madison Riggs Date of Encounter: 06/18/2018  Primary Cardiologist: Minus Breeding, MD   Subjective   He is a lot better after paracentesis.  No chest pain.  Minimal shortness of breath.  Inpatient Medications    Scheduled Meds: . sodium chloride   Intravenous Once  . allopurinol  100 mg Oral BID  . amiodarone  200 mg Oral BID  . calcium carbonate  1 tablet Oral Q breakfast  . ferrous sulfate  325 mg Oral Daily  . fluticasone  1 spray Each Nare Daily  . heparin injection (subcutaneous)  5,000 Units Subcutaneous Q8H  . lactobacillus acidophilus & bulgar  1 tablet Oral Daily  . levothyroxine  100 mcg Oral Q0600  . loratadine  10 mg Oral Daily  . magnesium oxide  400 mg Oral Daily  . multivitamin with minerals  1 tablet Oral Daily  . pantoprazole  40 mg Oral BID  . senna-docusate  3 tablet Oral BID  . simvastatin  20 mg Oral Daily  . traZODone  100 mg Oral QHS  . venlafaxine XR  75 mg Oral Daily  . vitamin B-12  5,000 mcg Oral Daily   Continuous Infusions:  PRN Meds: acetaminophen **OR** acetaminophen, hydrALAZINE, hydrOXYzine, lidocaine, zolpidem   Vital Signs    Vitals:   06/17/18 1431 06/17/18 2053 06/18/18 0650 06/18/18 1057  BP: (!) 98/54 (!) 90/49 (!) 81/56 (!) 78/54  Pulse:  97 (!) 104 88  Resp:  18 17 17   Temp:  99.1 F (37.3 C) 98.3 F (36.8 C) 98.5 F (36.9 C)  TempSrc:  Oral Oral Oral  SpO2:  95% 99% 98%  Weight:      Height:        Intake/Output Summary (Last 24 hours) at 06/18/2018 1256 Last data filed at 06/18/2018 1057 Gross per 24 hour  Intake 315 ml  Output 7200 ml  Net -6885 ml   Filed Weights   06/15/18 0601 06/16/18 0518 06/17/18 0550  Weight: 71 kg 71.2 kg 71.1 kg    Telemetry    Atrial fibrillation 80-110s- Personally Reviewed  ECG    Atrial fibrillation 87- Personally Reviewed  Physical Exam   GEN: No acute distress.   Neck: No JVD Cardiac: irreg, no murmurs, rubs, or gallops.  Respiratory:  Clear to auscultation bilaterally. GI: Soft, nontender, non-distended  MS: No edema; No deformity. Neuro:  Nonfocal  Psych: Normal affect   Labs    Chemistry Recent Labs  Lab 06/16/18 0335 06/17/18 0843 06/18/18 0353  NA 134* 136 137  K 4.2 4.3 4.0  CL 103 104 105  CO2 23 24 24   GLUCOSE 171* 106* 99  BUN 65* 63* 63*  CREATININE 1.95* 1.86* 1.87*  CALCIUM 7.9* 8.4* 8.2*  GFRNONAA 22* 24* 24*  GFRAA 26* 28* 27*  ANIONGAP 8 8 8      Hematology Recent Labs  Lab 06/16/18 0335 06/17/18 0843 06/18/18 0353  WBC 6.4 6.7 7.0  RBC 2.83* 3.02* 2.56*  HGB 8.4* 8.7* 7.4*  HCT 26.8* 28.7* 24.3*  MCV 94.7 95.0 94.9  MCH 29.7 28.8 28.9  MCHC 31.3 30.3 30.5  RDW 15.9* 15.9* 16.0*  PLT 196 196 188    Cardiac EnzymesNo results for input(s): TROPONINI in the last 168 hours. No results for input(s): TROPIPOC in the last 168 hours.   BNPNo results for input(s): BNP, PROBNP in the last 168 hours.   DDimer No results for input(s): DDIMER in the last 168  hours.   Radiology    Ir Paracentesis  Result Date: 06/17/2018 INDICATION: Patient with history of chronic diastolic HF, CKD stage 4, abdominal distension, and ascites. Request is made for diagnostic and therapeutic paracentesis. EXAM: ULTRASOUND GUIDED DIAGNOSTIC AND THERAPEUTIC PARACENTESIS MEDICATIONS: 10 mL 1% lidocaine COMPLICATIONS: None immediate. PROCEDURE: Informed written consent was obtained from the patient after a discussion of the risks, benefits and alternatives to treatment. A timeout was performed prior to the initiation of the procedure. Initial ultrasound scanning demonstrates a moderate amount of ascites within the left lower abdominal quadrant. The left lower abdomen was prepped and draped in the usual sterile fashion. 1% lidocaine was used for local anesthesia. Following this, a 19 gauge, 7-cm, Yueh catheter was introduced. An ultrasound image was saved for documentation purposes. The paracentesis was performed. The  catheter was removed and a dressing was applied. The patient tolerated the procedure well without immediate post procedural complication. FINDINGS: A total of approximately 6.2 L of clear gold fluid was removed. Samples were sent to the laboratory as requested by the clinical team. IMPRESSION: Successful ultrasound-guided paracentesis yielding 6.2 L of peritoneal fluid. Read by: Earley Abide, PA-C Electronically Signed   By: Aletta Edouard M.D.   On: 06/17/2018 14:36    Cardiac Studies   Echocardiogram 06/12/2018: Study Conclusions  - Left ventricle: The cavity size was normal. Wall thickness was normal. Systolic function was vigorous. The estimated ejection fraction was in the range of 65% to 70%. Wall motion was normal; there were no regional wall motion abnormalities. The study was not technically sufficient to allow evaluation of LV diastolic dysfunction due to atrial fibrillation. Doppler parameters are consistent with high ventricular filling pressure. - Aortic valve: Valve mobility was restricted. There was mild to moderate stenosis. Valve area (VTI): 1.04 cm^2. Valve area (Vmax): 0.95 cm^2. Valve area (Vmean): 0.97 cm^2. - Mitral valve: Severely calcified annulus. Mildly thickened leaflets . Mild diffuse calcification, with mild involvement of chords. There was moderate regurgitation directed centrally. Valve area by continuity equation (using LVOT flow): 1.52 cm^2. - Left atrium: The atrium was mildly dilated. - Right atrium: The atrium was mildly dilated. - Atrial septum: No defect or patent foramen ovale was identified. - Tricuspid valve: There was moderate regurgitation. - Pulmonary arteries: Systolic pressure was mildly increased. PA peak pressure: 36 mm Hg (S).   Patient Profile     85 y.o. female  a history of diastolic congestive heart failure, aortic stenosis, hypertension, hyperlipidemia, diet-controlled diabetes, obstructive sleep apnea  on CPAP, GERD, hypothyroidism, and CKD stage IV who presented to the [facility] ED on 06/09/2018 after suffering a fall. Cardiology was consulted for evaluation of atrial fibrillation with RVR.   Assessment & Plan    Atrial fibrillation with rapid ventricular response persistent, new onset -Amiodarone is currently being utilized for rate control because of relative hypotension - Continue with p.o. 200 twice daily for 2 weeks then once a day. -No anticoagulation because of recent GI bleed and multiple falls as well as severe anemia.  This does increase her stroke risk.  Acute on chronic diastolic heart failure - IV diuresis was administered for 1 day.  creatinine is stable 1.87.  out -6.6 L yesterday because of 6 L paracentesis.  I do not have a daily weight. - Lets get her back on her home dose of Demadex 40 mg twice a day  Anemia - PRBC being administered.  No further recommendations at this time. We will go ahead and sign  off.  Please let us know we can be of further assistance.  Medication recommendations as above.      For questions or updates, please contact [place] Please consult [email or web address] for contact info under        Signed, Candee Furbish, MD  06/18/2018, 12:56 PM

## 2018-06-18 NOTE — Progress Notes (Signed)
PROGRESS NOTE  Madison Riggs:[id number] DOB: May 02, 1928 DOA: 06/09/2018 PCP: Rolene Course, PA-C   LOS: 5 days   Brief Narrative / Interim history:  85 year old female with history of hypertension, hyperlipidemia, diet-controlled diabetes mellitus, hypothyroidism, chronic kidney disease stage IV, sleep apnea on CPAP, diastolic CHF who was admitted to the hospital after having a fall.  She has also been complaining of intermittent shortness of breath, and also reports intermittent rectal bleeding for "quite some time".  She was found to have new onset of A. fib with RVR with worsening anemia with a hemoglobin of 7.0, and worsening creatinine.  She was admitted to the hospitalist service  Subjective: -Has any chest pain, or shortness of breath, reports feeling much better after paracentesis yesterday.  Assessment & Plan: Principal Problem:   Atrial fibrillation with RVR (HCC) Active Problems:   Iron deficiency anemia   Hypertension   Type II diabetes mellitus with renal manifestations (HCC)   Chronic diastolic HF (heart failure) (HCC)   CKD (chronic kidney disease), stage IV (HCC)   New onset atrial fibrillation (HCC)   HLD (hyperlipidemia)   GERD (gastroesophageal reflux disease)   Hypothyroidism   Fall   Positive occult stool blood test   New onset paroxysmal A. fib with RVR -Cardiology consulted, appreciate input. Soft blood pressure limits beta-blocker and calcium channel blocker, started on IV amiodarone 12/28 -2D echo done , results below -Amiodarone has been switched to p.o. on 12/29 per cardiology.  Appreciate cardiology follow-up -Telemetry shows rates 100-110 -She is not anticoagulation candidate given her chronic GI bleed, and multiple falls.  Acute on chronic diastolic CHF -2D echo done this admission showing preserved EF, unable to evaluate for diastolic function, but she is with known grade 2 diastolic dysfunction on echo 2018 -Diuresis per cardiology, she  is on IV Lasix, he did receive 6.2 L paracentesis yesterday, her blood pressure in the lower side today, so I will DC IV Lasix for today and reassess tomorrow. -Continue with daily weight, strict ins and outs, low-salt diet  Falls -Probably unsafe to go back home given how frail she is especially that she may not have supervision, discussed with Education officer, museum, will pursue SNF -Patient agreeable  Ascitis  -Patient with history of recurrent ascites, he had paracentesis last year, she was noted to have ascites today, she had paracentesis done with 6.2 L drained, albumin was  given  Iron deficiency anemia/positive fecal occult -seen By [facility]  Dr. Paulita Fujita, for now conservative management -Not a candidate for anticoagulation for A. fib -Status post 2 units of packed red blood cells on 12/27, hemoglobin has overall remained stable.  Globin has dropped to 7.4 today, I will give 1 unit PRBC and repeat CBC in a.m.Marland Kitchen -Did receive IV iron during hospital stay, -known CKD, likely will benefit from Procrit and IV iron as an outpatient, she is following with Dr. Clover Mealy as an outpatient,   Hypothyroidism -TSH within normal limits at 2.5, continue levothyroxine  Acute kidney injury on chronic kidney disease -Baseline creatinine around 1.5, was 1.8 on admission, peaked at 2.1, continue to monitor closely especially in the setting of IV diuresis,   Hypertension - blood pressure on the lower side today, her antihypertensive already on hold, I will hold Lasix today  Type 2 diabetes mellitus, diet controlled -Fasting 97 this morning, continue to monitor   Hyperlipidemia -Continue home medications  Scheduled Meds: . sodium chloride   Intravenous Once  . allopurinol  100 mg Oral  BID  . amiodarone  200 mg Oral BID  . calcium carbonate  1 tablet Oral Q breakfast  . ferrous sulfate  325 mg Oral Daily  . fluticasone  1 spray Each Nare Daily  . heparin injection (subcutaneous)  5,000 Units  Subcutaneous Q8H  . lactobacillus acidophilus & bulgar  1 tablet Oral Daily  . levothyroxine  100 mcg Oral Q0600  . loratadine  10 mg Oral Daily  . magnesium oxide  400 mg Oral Daily  . multivitamin with minerals  1 tablet Oral Daily  . pantoprazole  40 mg Oral BID  . senna-docusate  3 tablet Oral BID  . simvastatin  20 mg Oral Daily  . traZODone  100 mg Oral QHS  . venlafaxine XR  75 mg Oral Daily  . vitamin B-12  5,000 mcg Oral Daily   Continuous Infusions:  PRN Meds:.acetaminophen **OR** acetaminophen, hydrALAZINE, hydrOXYzine, lidocaine, zolpidem  DVT prophylaxis: SCDs, Smoot heparin Code Status: Full code Family Communication: Cussed with son via phone 06/17/2018 Disposition Plan: SNF, possibly by Monday  Consultants:   Cardiology  Gastroenterology  Procedures:   2D echo Study Conclusions - Left ventricle: The cavity size was normal. Wall thickness was normal. Systolic function was vigorous. The estimated ejection fraction was in the range of 65% to 70%. Wall motion was normal; there were no regional wall motion abnormalities. The study was not technically sufficient to allow evaluation of LV diastolic dysfunction due to atrial fibrillation. Doppler parameters are consistent with high ventricular filling pressure. - Aortic valve: Valve mobility was restricted. There was mild to moderate stenosis. Valve area (VTI): 1.04 cm^2. Valve area (Vmax): 0.95 cm^2. Valve area (Vmean): 0.97 cm^2. - Mitral valve: Severely calcified annulus. Mildly thickened leaflets . Mild diffuse calcification, with mild involvement of chords. There was moderate regurgitation directed centrally. Valve area by continuity equation (using LVOT flow): 1.52 cm^2. - Left atrium: The atrium was mildly dilated. - Right atrium: The atrium was mildly dilated. - Atrial septum: No defect or patent foramen ovale was identified. - Tricuspid valve: There was moderate regurgitation. - Pulmonary arteries: Systolic pressure  was mildly increased. PA peak pressure: 36 mm Hg (S).    Antimicrobials:  None    Objective: Vitals:   06/17/18 1326 06/17/18 1431 06/17/18 2053 06/18/18 0650  BP: 96/65 (!) 98/54 (!) 90/49 (!) 81/56  Pulse:   97 (!) 104  Resp:   18 17  Temp:   99.1 F (37.3 C) 98.3 F (36.8 C)  TempSrc:   Oral Oral  SpO2:   95% 99%  Weight:      Height:        Intake/Output Summary (Last 24 hours) at 06/18/2018 1036 Last data filed at 06/17/2018 2308 Gross per 24 hour  Intake 240 ml  Output 7200 ml  Net -6960 ml   Filed Weights   06/15/18 0601 06/16/18 0518 06/17/18 0550  Weight: 71 kg 71.2 kg 71.1 kg    Examination:  Awake Alert, Oriented X 3, No new F.N deficits, Normal affect Symmetrical Chest wall movement, Good air movement bilaterally, CTAB RRR,No Gallops,Rubs or new Murmurs, No Parasternal Heave +ve B.Sounds, Abd Soft ,abdomen and distention and ascites has resolved, No rebound - guarding or rigidity. No Cyanosis, Clubbing , +1  edema, No new Rash or bruise       Data Reviewed: I have independently reviewed following labs and imaging studies   CBC: Recent Labs  Lab 06/14/18 0402 06/15/18 0401 06/16/18 0335 06/17/18 0843 06/18/18  0353  WBC 6.7 7.2 6.4 6.7 7.0  HGB 9.0* 8.9* 8.4* 8.7* 7.4*  HCT 28.8* 28.7* 26.8* 28.7* 24.3*  MCV 95.0 95.3 94.7 95.0 94.9  PLT 192 199 196 196 161   Basic Metabolic Panel: Recent Labs  Lab 06/14/18 0402 06/15/18 0401 06/16/18 0335 06/17/18 0843 06/18/18 0353  NA 137 135 134* 136 137  K 4.3 4.4 4.2 4.3 4.0  CL 102 102 103 104 105  CO2 27 27 23 24 24   GLUCOSE 112* 116* 171* 106* 99  BUN 58* 61* 65* 63* 63*  CREATININE 2.11* 1.98* 1.95* 1.86* 1.87*  CALCIUM 8.1* 8.2* 7.9* 8.4* 8.2*   GFR: Estimated Creatinine Clearance: 19.2 mL/min (A) (by C-G formula based on SCr of 1.87 mg/dL (H)). Liver Function Tests: No results for input(s): AST, ALT, ALKPHOS, BILITOT, PROT, ALBUMIN in the last 168 hours. No results for input(s):  LIPASE, AMYLASE in the last 168 hours. No results for input(s): AMMONIA in the last 168 hours. Coagulation Profile: No results for input(s): INR, PROTIME in the last 168 hours. Cardiac Enzymes: No results for input(s): CKTOTAL, CKMB, CKMBINDEX, TROPONINI in the last 168 hours. BNP (last 3 results) No results for input(s): PROBNP in the last 8760 hours. HbA1C: No results for input(s): HGBA1C in the last 72 hours. CBG: Recent Labs  Lab 06/14/18 0745 06/15/18 0759 06/16/18 0549 06/17/18 0741 06/18/18 0748  GLUCAP 97 97 123* 104* 96   Lipid Profile: No results for input(s): CHOL, HDL, LDLCALC, TRIG, CHOLHDL, LDLDIRECT in the last 72 hours. Thyroid Function Tests: No results for input(s): TSH, T4TOTAL, FREET4, T3FREE, THYROIDAB in the last 72 hours. Anemia Panel: No results for input(s): VITAMINB12, FOLATE, FERRITIN, TIBC, IRON, RETICCTPCT in the last 72 hours. Urine analysis:    Component Value Date/Time   COLORURINE YELLOW 06/10/2018 1416   APPEARANCEUR CLEAR 06/10/2018 1416   LABSPEC 1.015 06/10/2018 1416   PHURINE 5.0 06/10/2018 1416   GLUCOSEU NEGATIVE 06/10/2018 1416   HGBUR NEGATIVE 06/10/2018 1416   BILIRUBINUR NEGATIVE 06/10/2018 1416   KETONESUR NEGATIVE 06/10/2018 1416   PROTEINUR NEGATIVE 06/10/2018 1416   NITRITE NEGATIVE 06/10/2018 1416   LEUKOCYTESUR NEGATIVE 06/10/2018 1416   Sepsis Labs: Invalid input(s): PROCALCITONIN, LACTICIDVEN  No results found for this or any previous visit (from the past 240 hour(s)).    Radiology Studies: Ir Paracentesis  Result Date: 06/17/2018 INDICATION: Patient with history of chronic diastolic HF, CKD stage 4, abdominal distension, and ascites. Request is made for diagnostic and therapeutic paracentesis. EXAM: ULTRASOUND GUIDED DIAGNOSTIC AND THERAPEUTIC PARACENTESIS MEDICATIONS: 10 mL 1% lidocaine COMPLICATIONS: None immediate. PROCEDURE: Informed written consent was obtained from the patient after a discussion of the risks,  benefits and alternatives to treatment. A timeout was performed prior to the initiation of the procedure. Initial ultrasound scanning demonstrates a moderate amount of ascites within the left lower abdominal quadrant. The left lower abdomen was prepped and draped in the usual sterile fashion. 1% lidocaine was used for local anesthesia. Following this, a 19 gauge, 7-cm, Yueh catheter was introduced. An ultrasound image was saved for documentation purposes. The paracentesis was performed. The catheter was removed and a dressing was applied. The patient tolerated the procedure well without immediate post procedural complication. FINDINGS: A total of approximately 6.2 L of clear gold fluid was removed. Samples were sent to the laboratory as requested by the clinical team. IMPRESSION: Successful ultrasound-guided paracentesis yielding 6.2 L of peritoneal fluid. Read by: Earley Abide, PA-C Electronically Signed   By: Eulas Post  Kathlene Cote M.D.   On: 06/17/2018 14:36     Phillips Climes , MD,  [facility] Pager [phone]  If 7PM-7AM, please contact night-coverage [email or web address] Password [facility] 06/18/2018, 10:36 AM

## 2018-06-18 NOTE — Progress Notes (Signed)
Pt refuses cpap

## 2018-06-19 LAB — BASIC METABOLIC PANEL
Anion gap: 9 (ref 5–15)
BUN: 58 mg/dL — ABNORMAL HIGH (ref 8–23)
CO2: 24 mmol/L (ref 22–32)
Calcium: 8.1 mg/dL — ABNORMAL LOW (ref 8.9–10.3)
Chloride: 103 mmol/L (ref 98–111)
Creatinine, Ser: 1.76 mg/dL — ABNORMAL HIGH (ref 0.44–1.00)
GFR calc Af Amer: 29 mL/min — ABNORMAL LOW (ref >60)
GFR calc non Af Amer: 25 mL/min — ABNORMAL LOW (ref >60)
Glucose, Bld: 159 mg/dL — ABNORMAL HIGH (ref 70–99)
POTASSIUM: 3.7 mmol/L (ref 3.5–5.1)
Sodium: 136 mmol/L (ref 135–145)

## 2018-06-19 LAB — CBC
HCT: 28.4 % — ABNORMAL LOW (ref 36.0–46.0)
Hemoglobin: 8.9 g/dL — ABNORMAL LOW (ref 12.0–15.0)
MCH: 29.5 pg (ref 26.0–34.0)
MCHC: 31.3 g/dL (ref 30.0–36.0)
MCV: 94 fL (ref 80.0–100.0)
Platelets: 213 10*3/uL (ref 150–400)
RBC: 3.02 MIL/uL — ABNORMAL LOW (ref 3.87–5.11)
RDW: 16.6 % — ABNORMAL HIGH (ref 11.5–15.5)
WBC: 12 10*3/uL — AB (ref 4.0–10.5)
nRBC: 0 % (ref 0.0–0.2)

## 2018-06-19 LAB — TYPE AND SCREEN
ABO/RH(D): B POS
Antibody Screen: NEGATIVE
Unit division: 0

## 2018-06-19 LAB — BPAM RBC
BLOOD PRODUCT EXPIRATION DATE: 202001282359
ISSUE DATE / TIME: 202001041048
UNIT TYPE AND RH: 7300

## 2018-06-19 MED ORDER — SORBITOL 70 % SOLN
960.0000 mL | TOPICAL_OIL | Freq: Once | ORAL | Status: DC
  Filled 2018-06-19 (×2): qty 473

## 2018-06-19 MED ORDER — POLYETHYLENE GLYCOL 3350 17 G PO PACK
34.0000 g | PACK | Freq: Once | ORAL | Status: AC
  Administered 2018-06-19: 34 g via ORAL
  Filled 2018-06-19: qty 2

## 2018-06-19 MED ORDER — POLYETHYLENE GLYCOL 3350 17 G PO PACK
17.0000 g | PACK | Freq: Two times a day (BID) | ORAL | Status: DC
  Administered 2018-06-19 – 2018-06-20 (×2): 17 g via ORAL
  Filled 2018-06-19 (×2): qty 1

## 2018-06-19 MED ORDER — BISACODYL 5 MG PO TBEC: 5.0000 mg | DELAYED_RELEASE_TABLET | Freq: Every day | ORAL | Status: DC | PRN

## 2018-06-19 NOTE — Progress Notes (Signed)
Pt is still feeling constipated. She had a small BM lastnight but she says she does not have relief.

## 2018-06-19 NOTE — Progress Notes (Signed)
Patient refuses the use of CPAP 

## 2018-06-19 NOTE — Progress Notes (Signed)
PROGRESS NOTE  Madison Riggs ZOX:[id number] DOB: 1928-01-22 DOA: 06/09/2018 PCP: Rolene Course, PA-C   LOS: 6 days   Brief Narrative / Interim history:  85 year old female with history of hypertension, hyperlipidemia, diet-controlled diabetes mellitus, hypothyroidism, chronic kidney disease stage IV, sleep apnea on CPAP, diastolic CHF who was admitted to the hospital after having a fall.  She has also been complaining of intermittent shortness of breath, and also reports intermittent rectal bleeding for "quite some time".  She was found to have new onset of A. fib with RVR with worsening anemia with a hemoglobin of 7.0, and worsening creatinine.  She was admitted to the hospitalist service  Subjective: -Has any chest pain, no shortness of breath, has constipation, had full disimpaction today.  Assessment & Plan: Principal Problem:   Atrial fibrillation with RVR (HCC) Active Problems:   Iron deficiency anemia   Hypertension   Type II diabetes mellitus with renal manifestations (HCC)   Chronic diastolic HF (heart failure) (HCC)   CKD (chronic kidney disease), stage IV (HCC)   New onset atrial fibrillation (HCC)   HLD (hyperlipidemia)   GERD (gastroesophageal reflux disease)   Hypothyroidism   Fall   Positive occult stool blood test   New onset paroxysmal A. fib with RVR -Cardiology consulted, appreciate input. Soft blood pressure limits beta-blocker and calcium channel blocker, started on IV amiodarone 12/28 -2D echo done , results below -Amiodarone has been switched to p.o. on 12/29 per cardiology.  Appreciate cardiology follow-up -Telemetry shows rates 100-110 -She is not anticoagulation candidate given her chronic GI bleed, and multiple falls.  Acute on chronic diastolic CHF -2D echo done this admission showing preserved EF, unable to evaluate for diastolic function, but she is with known grade 2 diastolic dysfunction on echo 2018 -Diuresis per cardiology, she is on IV  Lasix, he did receive 6.2 L paracentesis yesterday, her blood pressure in the lower side today, so I will DC IV Lasix for today and reassess tomorrow. -Continue with daily weight, strict ins and outs, low-salt diet  Falls -Probably unsafe to go back home given how frail she is especially that she may not have supervision, discussed with Education officer, museum, will pursue SNF -Patient agreeable  Ascitis  -Patient with history of recurrent ascites, he had paracentesis last year, she was noted to have ascites today, she had paracentesis done with 6.2 L drained, albumin was  given  Iron deficiency anemia/positive fecal occult -seen By [facility]  Dr. Paulita Fujita, for now conservative management -Not a candidate for anticoagulation for A. fib -Status post 2 units of packed red blood cells on 12/27, hemoglobin has overall remained stable.  Globin has dropped to 7.4 today, transfused 1 unit PRBC yesterday, with good response, hemoglobin 8.9 today-Did receive IV iron during hospital stay, -known CKD, likely will benefit from Procrit and IV iron as an outpatient, she is following with Dr. Clover Mealy as an outpatient,   Hypothyroidism -TSH within normal limits at 2.5, continue levothyroxine  Acute kidney injury on chronic kidney disease -Baseline creatinine around 1.5, was 1.8 on admission, peaked at 2.1, continue to monitor closely especially in the setting of IV diuresis,   Hypertension - blood pressure on the lower side today, her antihypertensive already on hold, I will hold Lasix today  Type 2 diabetes mellitus, diet controlled -continue to monitor   Hyperlipidemia -Continue home medications  Scheduled Meds: . sodium chloride   Intravenous Once  . allopurinol  100 mg Oral BID  . amiodarone  200  mg Oral BID  . calcium carbonate  1 tablet Oral Q breakfast  . ferrous sulfate  325 mg Oral Daily  . fluticasone  1 spray Each Nare Daily  . heparin injection (subcutaneous)  5,000 Units Subcutaneous  Q8H  . lactobacillus acidophilus & bulgar  1 tablet Oral Daily  . levothyroxine  100 mcg Oral Q0600  . loratadine  10 mg Oral Daily  . magnesium oxide  400 mg Oral Daily  . multivitamin with minerals  1 tablet Oral Daily  . pantoprazole  40 mg Oral BID  . polyethylene glycol  17 g Oral BID  . senna-docusate  3 tablet Oral BID  . simvastatin  20 mg Oral Daily  . sorbitol, milk of mag, mineral oil, glycerin (SMOG) enema  960 mL Rectal Once  . torsemide  40 mg Oral BID  . traZODone  100 mg Oral QHS  . venlafaxine XR  75 mg Oral Daily  . vitamin B-12  5,000 mcg Oral Daily   Continuous Infusions:  PRN Meds:.acetaminophen **OR** acetaminophen, bisacodyl, hydrALAZINE, hydrOXYzine, lidocaine, zolpidem  DVT prophylaxis: SCDs, Superior heparin Code Status: Full code Family Communication: Cussed with son via phone 06/17/2018 Disposition Plan: SNF, possibly by Monday  Consultants:   Cardiology  Gastroenterology  Procedures:   2D echo Study Conclusions - Left ventricle: The cavity size was normal. Wall thickness was normal. Systolic function was vigorous. The estimated ejection fraction was in the range of 65% to 70%. Wall motion was normal; there were no regional wall motion abnormalities. The study was not technically sufficient to allow evaluation of LV diastolic dysfunction due to atrial fibrillation. Doppler parameters are consistent with high ventricular filling pressure. - Aortic valve: Valve mobility was restricted. There was mild to moderate stenosis. Valve area (VTI): 1.04 cm^2. Valve area (Vmax): 0.95 cm^2. Valve area (Vmean): 0.97 cm^2. - Mitral valve: Severely calcified annulus. Mildly thickened leaflets . Mild diffuse calcification, with mild involvement of chords. There was moderate regurgitation directed centrally. Valve area by continuity equation (using LVOT flow): 1.52 cm^2. - Left atrium: The atrium was mildly dilated. - Right atrium: The atrium was mildly dilated. - Atrial  septum: No defect or patent foramen ovale was identified. - Tricuspid valve: There was moderate regurgitation. - Pulmonary arteries: Systolic pressure was mildly increased. PA peak pressure: 36 mm Hg (S).    Antimicrobials:  None    Objective: Vitals:   06/18/18 2200 06/18/18 2300 06/19/18 0000 06/19/18 1313  BP:    (!) 87/53  Pulse:    94  Resp: 20 18 17 19   Temp:    97.7 F (36.5 C)  TempSrc:    Oral  SpO2:      Weight:      Height:        Intake/Output Summary (Last 24 hours) at 06/19/2018 1547 Last data filed at 06/18/2018 1800 Gross per 24 hour  Intake -  Output 800 ml  Net -800 ml   Filed Weights   06/15/18 0601 06/16/18 0518 06/17/18 0550  Weight: 71 kg 71.2 kg 71.1 kg    Examination:  Awake Alert, Oriented X 3, No new F.N deficits, Normal affect, frail, chronically ill-appearing Symmetrical Chest wall movement, Good air movement bilaterally, CTAB RRR,No Gallops,Rubs or new Murmurs, No Parasternal Heave +ve B.Sounds, Abd Soft, tension and ascites significantly improved, No rebound - guarding or rigidity. No Cyanosis, Clubbing, +1 edema, No new Rash or bruise       Data Reviewed: I have independently reviewed following  labs and imaging studies   CBC: Recent Labs  Lab 06/16/18 0335 06/17/18 0843 06/18/18 0353 06/18/18 1655 06/19/18 0434  WBC 6.4 6.7 7.0 7.0 12.0*  NEUTROABS  --   --   --  5.3  --   HGB 8.4* 8.7* 7.4* 9.2* 8.9*  HCT 26.8* 28.7* 24.3* 29.5* 28.4*  MCV 94.7 95.0 94.9 92.5 94.0  PLT 196 196 188 208 320   Basic Metabolic Panel: Recent Labs  Lab 06/15/18 0401 06/16/18 0335 06/17/18 0843 06/18/18 0353 06/19/18 0434  NA 135 134* 136 137 136  K 4.4 4.2 4.3 4.0 3.7  CL 102 103 104 105 103  CO2 27 23 24 24 24   GLUCOSE 116* 171* 106* 99 159*  BUN 61* 65* 63* 63* 58*  CREATININE 1.98* 1.95* 1.86* 1.87* 1.76*  CALCIUM 8.2* 7.9* 8.4* 8.2* 8.1*   GFR: Estimated Creatinine Clearance: 20.4 mL/min (A) (by C-G formula based on SCr of 1.76  mg/dL (H)). Liver Function Tests: No results for input(s): AST, ALT, ALKPHOS, BILITOT, PROT, ALBUMIN in the last 168 hours. No results for input(s): LIPASE, AMYLASE in the last 168 hours. No results for input(s): AMMONIA in the last 168 hours. Coagulation Profile: No results for input(s): INR, PROTIME in the last 168 hours. Cardiac Enzymes: No results for input(s): CKTOTAL, CKMB, CKMBINDEX, TROPONINI in the last 168 hours. BNP (last 3 results) No results for input(s): PROBNP in the last 8760 hours. HbA1C: No results for input(s): HGBA1C in the last 72 hours. CBG: Recent Labs  Lab 06/14/18 0745 06/15/18 0759 06/16/18 0549 06/17/18 0741 06/18/18 0748  GLUCAP 97 97 123* 104* 96   Lipid Profile: No results for input(s): CHOL, HDL, LDLCALC, TRIG, CHOLHDL, LDLDIRECT in the last 72 hours. Thyroid Function Tests: No results for input(s): TSH, T4TOTAL, FREET4, T3FREE, THYROIDAB in the last 72 hours. Anemia Panel: No results for input(s): VITAMINB12, FOLATE, FERRITIN, TIBC, IRON, RETICCTPCT in the last 72 hours. Urine analysis:    Component Value Date/Time   COLORURINE YELLOW 06/10/2018 1416   APPEARANCEUR CLEAR 06/10/2018 1416   LABSPEC 1.015 06/10/2018 1416   PHURINE 5.0 06/10/2018 1416   GLUCOSEU NEGATIVE 06/10/2018 1416   HGBUR NEGATIVE 06/10/2018 1416   BILIRUBINUR NEGATIVE 06/10/2018 1416   KETONESUR NEGATIVE 06/10/2018 1416   PROTEINUR NEGATIVE 06/10/2018 1416   NITRITE NEGATIVE 06/10/2018 1416   LEUKOCYTESUR NEGATIVE 06/10/2018 1416   Sepsis Labs: Invalid input(s): PROCALCITONIN, LACTICIDVEN  No results found for this or any previous visit (from the past 240 hour(s)).    Radiology Studies: No results found.   Phillips Climes , MD,  [facility] Pager [phone]  If 7PM-7AM, please contact night-coverage [email or web address] Password [facility] 06/19/2018, 3:47 PM

## 2018-06-19 NOTE — Progress Notes (Signed)
Patient attempting to pass stool. Upon examination, stool visible via in the rectum but not able to push. Disimpacted on request. Large hard balls of stool obtained.MD made aware. New orders received.

## 2018-06-20 ENCOUNTER — Other Ambulatory Visit: Payer: Self-pay

## 2018-06-20 LAB — BASIC METABOLIC PANEL
ANION GAP: 7 (ref 5–15)
BUN: 54 mg/dL — ABNORMAL HIGH (ref 8–23)
CO2: 23 mmol/L (ref 22–32)
Calcium: 8.3 mg/dL — ABNORMAL LOW (ref 8.9–10.3)
Chloride: 105 mmol/L (ref 98–111)
Creatinine, Ser: 1.52 mg/dL — ABNORMAL HIGH (ref 0.44–1.00)
GFR calc Af Amer: 35 mL/min — ABNORMAL LOW (ref >60)
GFR calc non Af Amer: 30 mL/min — ABNORMAL LOW (ref >60)
Glucose, Bld: 94 mg/dL (ref 70–99)
Potassium: 3.8 mmol/L (ref 3.5–5.1)
Sodium: 135 mmol/L (ref 135–145)

## 2018-06-20 LAB — HEMOGLOBIN AND HEMATOCRIT, BLOOD
HCT: 29.9 % — ABNORMAL LOW (ref 36.0–46.0)
Hemoglobin: 9.2 g/dL — ABNORMAL LOW (ref 12.0–15.0)

## 2018-06-20 LAB — PATHOLOGIST SMEAR REVIEW

## 2018-06-20 MED ORDER — MIDODRINE HCL 5 MG PO TABS: 5.0000 mg | ORAL_TABLET | Freq: Two times a day (BID) | ORAL | Status: DC

## 2018-06-20 MED ORDER — ACETAMINOPHEN 325 MG PO TABS: 650.0000 mg | ORAL_TABLET | Freq: Four times a day (QID) | ORAL | Status: AC | PRN

## 2018-06-20 MED ORDER — TORSEMIDE 20 MG PO TABS: 40.0000 mg | ORAL_TABLET | Freq: Every day | ORAL | Status: DC

## 2018-06-20 MED ORDER — AMIODARONE HCL 200 MG PO TABS: ORAL_TABLET | ORAL | Status: DC

## 2018-06-20 MED ORDER — MIDODRINE HCL 5 MG PO TABS
5.0000 mg | ORAL_TABLET | ORAL | Status: AC
  Administered 2018-06-20: 5 mg via ORAL
  Filled 2018-06-20: qty 1

## 2018-06-20 MED ORDER — ALBUMIN HUMAN 25 % IV SOLN
12.5000 g | Freq: Once | INTRAVENOUS | Status: AC
  Administered 2018-06-20: 12.5 g via INTRAVENOUS
  Filled 2018-06-20: qty 50

## 2018-06-20 MED ORDER — VITAMIN B-12 5000 MCG SL SUBL: 100.0000 ug | SUBLINGUAL_TABLET | Freq: Every day | SUBLINGUAL | Status: AC

## 2018-06-20 MED ORDER — POTASSIUM CHLORIDE CRYS ER 20 MEQ PO TBCR: 20.0000 meq | EXTENDED_RELEASE_TABLET | Freq: Every day | ORAL | Status: AC

## 2018-06-20 MED ORDER — FERROUS SULFATE 325 (65 FE) MG PO TABS: 325.0000 mg | ORAL_TABLET | Freq: Every day | ORAL | 3 refills | Status: AC

## 2018-06-20 MED ORDER — ENSURE ENLIVE PO LIQD: 237.0000 mL | Freq: Two times a day (BID) | ORAL | Status: DC

## 2018-06-20 MED ORDER — BISACODYL 5 MG PO TBEC: 5.0000 mg | DELAYED_RELEASE_TABLET | Freq: Every day | ORAL | 0 refills | Status: AC | PRN

## 2018-06-20 MED ORDER — POLYETHYLENE GLYCOL 3350 17 G PO PACK: 17.0000 g | PACK | Freq: Every day | ORAL | 0 refills | Status: AC

## 2018-06-20 MED ORDER — ENSURE ENLIVE PO LIQD: 237.0000 mL | Freq: Two times a day (BID) | ORAL | 12 refills | Status: DC

## 2018-06-20 MED ORDER — TORSEMIDE 20 MG PO TABS: 40.0000 mg | ORAL_TABLET | Freq: Every day | ORAL | 3 refills | Status: DC

## 2018-06-20 NOTE — Discharge Summary (Signed)
Madison Riggs, is a 85 y.o. female  DOB 05-18-1928  MRN [id number].  Admission date:  06/09/2018  Admitting Physician  Ivor Costa, MD  Discharge Date:  06/20/2018   Primary MD  Rolene Course, PA-C  Recommendations for primary care physician for things to follow:  -Check CBC, BMP in 1 week from discharge -Monitor volume status closely, she is on torsemide 40 once a day on discharge, her home dose is 40 mg twice daily, readjust and increase if there is any signs of volume overload -Patient will need to follow with nephrology as an outpatient regarding her chronic iron deficiency/chronic kidney disease anemia, as she may benefit from IV iron transfusions and Procrit.   Admission Diagnosis  Renal insufficiency [N28.9] Atrial fibrillation with rapid ventricular response (HCC) [I48.91] Macrocytic anemia [D53.9] Hypotension, unspecified hypotension type [I95.9]   Discharge Diagnosis  Renal insufficiency [N28.9] Atrial fibrillation with rapid ventricular response (HCC) [I48.91] Macrocytic anemia [D53.9] Hypotension, unspecified hypotension type [I95.9]    Principal Problem:   Atrial fibrillation with RVR (HCC) Active Problems:   Iron deficiency anemia   Hypertension   Type II diabetes mellitus with renal manifestations (HCC)   Chronic diastolic HF (heart failure) (HCC)   CKD (chronic kidney disease), stage IV (HCC)   New onset atrial fibrillation (HCC)   HLD (hyperlipidemia)   GERD (gastroesophageal reflux disease)   Hypothyroidism   Fall   Positive occult stool blood test      Past Medical History:  Diagnosis Date  . Aortic stenosis    moderate  . CKD (chronic kidney disease) stage 3, GFR 30-59 ml/min (HCC)   . Depression   . DM (diabetes mellitus), type 2 ([place])   . Gout   . HTN (hypertension)   . Hypercholesterolemia   . Hypothyroidism   . Iron deficiency anemia   . Sleep apnea    CPAP  . Syncope 06/10/2018    Past Surgical History:  Procedure Laterality Date  . BACK SURGERY  1984  . CATARACT EXTRACTION, BILATERAL  1997  . CHOLECYSTECTOMY  2001  . ESOPHAGOGASTRODUODENOSCOPY N/A 10/18/2016   Procedure: ESOPHAGOGASTRODUODENOSCOPY (EGD);  Surgeon: Teena Irani, MD;  Location: [facility];  Service: Endoscopy;  Laterality: N/A;  . GASTRIC BYPASS  1999  . IR PARACENTESIS  02/09/2017  . IR PARACENTESIS  06/17/2018  . KNEE SURGERY Left 1994  . THORACENTESIS Right    in [place], [place]  . TONSILLECTOMY         History of present illness and  Hospital Course:     Kindly see H&P for history of present illness and admission details, please review complete Labs, Consult reports and Test reports for all details in brief  HPI  from the history and physical done on the day of admission 06/10/2018  HPI: Madison Riggs is a 85 y.o. female with medical history significant of hypertension, hyperlipidemia, diet-controlled diabetes, GERD, hypothyroidism, gout, aortic stenosis, CKD 4, OSA on CPAP, iron deficiency anemia, mild dementia, dCHF, gastric bypass surgery, presents with fall.  Per patient's son, he heard a sound from pt's fall and found pt on the floor at about 9:30 AM. Pt was confused for a few seconds, then become oriented x3.  Initially ED physician thought pt could have a syncope, but her son strongly denies that had loss consciousness.  Patient denies any unilateral weakness, numbness or tingling his extremities.  No facial droop or slurred speech.  No vision change or hearing loss.  Patient denies any chest pain or cough.  She has some mild intermittent shortness of breath.  Per patient's son, patient had mild rectal bleeding in the past which he attributes to possible hemorrhoid.  Patient denies recent rectal bleeding or dark stool.  No nausea vomiting, diarrhea or abdominal pain.  No symptoms of UTI.  When I saw patient in the ED, patient is alert, oriented x3.  She  complains of mild lower back pain. Per patient's son, patient had negative colonoscopy many years ago.  She also had EGD long time ago which was likely negative except for acid reflux issue per his son.   ED Course: pt was found to have new onset A. fib with RVR with heart rate up to 138, positive FOBT, hemoglobin dropped from 9.9 on 03/14/18 to 7.0, WBC 7.9, worsening renal function, soft blood pressure, no tachypnea, oxygen saturation 97% on room air.  CT head is negative for acute intracranial abnormalities.  CT of C-spine is negative for traumatic fracture.  X-ray of lumbar spine is  negative for fracture, showed degenerative disc disease.  Patient is placed on stepdown bed for observation.  Cardiology, Dr. Massie Bougie was consulted by EDP.  Hospital Course   85 year old female with history of hypertension, hyperlipidemia, diet-controlled diabetes mellitus, hypothyroidism, chronic kidney disease stage IV, sleep apnea on CPAP,diastolic CHF who was admitted to the hospital after having a fall. She has also been complaining of intermittent shortness of breath, and also reports intermittent rectal bleeding for "quite some time". She was found to have new onset of A. fib with RVR with worsening anemia with a hemoglobin of 7.0, and worsening creatinine. She was admitted to the hospitalist service  New onset paroxysmal A. fib with RVR -Cardiology consulted, appreciate input.Soft blood pressure limits beta-blocker and calcium channel blocker, started on IV amiodarone 12/28 -2D echo done , results below -Amiodarone has been switched to p.o. on 12/29 per cardiology.  Appreciate cardiology follow-up, condition for total of 2 weeks of 200 mg oral twice daily, then 100 mg oral daily(to be on 06/26/2018) -Telemetry shows rates is under 100 -She is not anticoagulation candidate given her chronic GI bleed, and multiple falls, severe anemia requiring multiple transfusions  Acute on chronic diastolic CHF -2D  echo done this admission showing preserved EF, unable to evaluate for diastolic function, but she is with known grade 2 diastolic dysfunction on echo 2018 -Required IV diuresis during hospital stay, she was transitioned to her home dose torsemide couple days ago, her pressure is soft, and edema status appears to be more appropriate, so she will be discharged on torsemide 40 mg oral once daily , to continue following salt restricted diet .   Falls -Probably unsafe to go back home given how frail she is especially that she may not have supervision, discussed with Education officer, museum, will pursue SNF -Patient agreeable  Ascitis  -Patient with history of recurrent ascites, he had paracentesis last year, she was noted to have ascites today, she had paracentesis done with 6.2 L drained, albumin was  given  Iron deficiency anemia/positive fecal occult -seen By [facility]  Dr. Paulita Fujita, for now conservative management -Not a candidate for anticoagulation for A. fib -Status post 2 units of packed red blood cells on 12/27, hemoglobin has overall remained stable.  Globin has dropped to 7.4 today, transfused 1 unit PRBC 06/18/2018, with good response, Madison Riggs is 9.2 on discharge -Did receive IV iron during hospital stay, -known CKD, likely will benefit from Procrit and IV iron as an outpatient, she is following with Dr. Clover Mealy as an outpatient,   Hypothyroidism -TSH within normal limits at 2.5, continue levothyroxine  Acute kidney injury on chronic kidney disease -Baseline creatinine around 1.5, was 1.8 on admission, peaked at 2.1, back to baseline, it is 1.5 on discharge  Hypertension -Patient blood pressure is soft during hospital stay, by reviewing previous records and visits, blood pressure is always on the lower side, averaging systolic in the 40C to low 100s, it is 93/60 at time of discharge, she is asymptomatic, this is her baseline, dizziness, lightheadedness even when systolic was in the 14G . -Was  started on low-dose Midodrin   Type 2 diabetes mellitus, diet controlled -continue to monitor     Discharge Condition:  Stable   Follow UP   Contact information for follow-up providers    Corliss Parish, MD Follow up.   Specialty:  Nephrology Contact information: [place] [address] [phone]            Contact information for after-discharge care    Destination    [facility] .   Service:  Skilled Nursing Contact information: [address] [place] [phone]                    Discharge Instructions  and  Discharge Medications     Discharge Instructions    Discharge instructions   Complete by:  As directed    Follow with Primary MD Rolene Course, PA-C in 7 days   Get CBC, CMP, checked  by Primary MD next visit.    Activity: As tolerated with Full fall precautions use walker/cane & assistance as needed   Disposition SNF   Diet: Heart Healthy, low salt diet with 1500 cc fluid restriction  , with feeding assistance and aspiration precautions.  For Heart failure patients - Check your Weight same time everyday, if you gain over 2 pounds, or you develop in leg swelling, experience more shortness of breath or chest pain, call your Primary MD immediately. Follow Cardiac Low Salt Diet and 1.5 lit/day fluid restriction.   On your next visit with your primary care physician please Get Medicines reviewed and adjusted.   Please request your Prim.MD to go over all Hospital Tests and Procedure/Radiological results at the follow up, please get all Hospital records sent to your Prim MD by signing hospital release before you go home.   If you experience worsening of your admission symptoms, develop shortness of breath, life threatening emergency, suicidal or homicidal thoughts you must seek medical attention immediately by calling 911 or calling your MD  immediately  if symptoms less severe.  You Must read complete instructions/literature along with all the possible adverse reactions/side effects for all the Medicines you take and that have been prescribed to you. Take any new Medicines after you have completely understood and accpet all the possible adverse reactions/side effects.   Do not drive, operating heavy machinery, perform activities at heights, swimming or  participation in water activities or provide baby sitting services if your were admitted for syncope or siezures until you have seen by Primary MD or a Neurologist and advised to do so again.  Do not drive when taking Pain medications.    Do not take more than prescribed Pain, Sleep and Anxiety Medications  Special Instructions: If you have smoked or chewed Tobacco  in the last 2 yrs please stop smoking, stop any regular Alcohol  and or any Recreational drug use.  Wear Seat belts while driving.   Please note  You were cared for by a hospitalist during your hospital stay. If you have any questions about your discharge medications or the care you received while you were in the hospital after you are discharged, you can call the unit and asked to speak with the hospitalist on call if the hospitalist that took care of you is not available. Once you are discharged, your primary care physician will handle any further medical issues. Please note that NO REFILLS for any discharge medications will be authorized once you are discharged, as it is imperative that you return to your primary care physician (or establish a relationship with a primary care physician if you do not have one) for your aftercare needs so that they can reassess your need for medications and monitor your lab values.   Increase activity slowly   Complete by:  As directed      Allergies as of 06/20/2018   No Known Allergies     Medication List    STOP taking these medications   metolazone 2.5 MG tablet Commonly  known as:  ZAROXOLYN   SLOW RELEASE IRON 45 MG Tbcr Generic drug:  Ferrous Sulfate Dried Replaced by:  ferrous sulfate 325 (65 FE) MG tablet     TAKE these medications   acetaminophen 325 MG tablet Commonly known as:  TYLENOL Take 2 tablets (650 mg total) by mouth every 6 (six) hours as needed for mild pain (or Fever >/= 101). What changed:    medication strength  how much to take  reasons to take this   Acidophilus 100 MG Caps Take 100 mg by mouth daily.   allopurinol 100 MG tablet Commonly known as:  ZYLOPRIM Take 100 mg by mouth 2 (two) times daily.   amiodarone 200 MG tablet Commonly known as:  PACERONE Use 200 mg PO BID for next 5 days, then transition to 200 mg oral daily on 06/26/2018   bisacodyl 5 MG EC tablet Commonly known as:  DULCOLAX Take 1 tablet (5 mg total) by mouth daily as needed for moderate constipation.   calcium carbonate 600 MG Tabs tablet Commonly known as:  OS-CAL Take 600 mg by mouth daily.   cetirizine 10 MG tablet Commonly known as:  ZYRTEC Take 10 mg by mouth at bedtime.   feeding supplement (ENSURE ENLIVE) Liqd Take 237 mLs by mouth 2 (two) times daily between meals. Start taking on:  June 21, 2018   ferrous sulfate 325 (65 FE) MG tablet Take 1 tablet (325 mg total) by mouth daily with breakfast. Replaces:  SLOW RELEASE IRON 45 MG Tbcr   fluticasone 50 MCG/ACT nasal spray Commonly known as:  FLONASE Place 1 spray into both nostrils daily.   Magnesium 400 MG Tabs Take 400 mg by mouth daily.   midodrine 5 MG tablet Commonly known as:  PROAMATINE Take 1 tablet (5 mg total) by mouth 2 (two) times daily with a meal.   pantoprazole 40  MG tablet Commonly known as:  PROTONIX Take 1 tablet (40 mg total) by mouth 2 (two) times daily. What changed:  when to take this   polyethylene glycol packet Commonly known as:  MIRALAX / GLYCOLAX Take 17 g by mouth daily for 5 days. For total of 5 days, may stop sooner if having regular bowel  movement.   potassium chloride SA 20 MEQ tablet Commonly known as:  KLOR-CON M20 Take 1 tablet (20 mEq total) by mouth daily.   senna-docusate 8.6-50 MG tablet Commonly known as:  Senokot-S Take 1 tablet by mouth 2 (two) times daily. What changed:  when to take this   simvastatin 40 MG tablet Commonly known as:  ZOCOR Take 20 mg by mouth daily.   SYNTHROID 100 MCG tablet Generic drug:  levothyroxine Take 100 mcg by mouth daily before breakfast.   torsemide 20 MG tablet Commonly known as:  DEMADEX Take 2 tablets (40 mg total) by mouth daily. What changed:  when to take this   traZODone 50 MG tablet Commonly known as:  DESYREL Take 100 mg by mouth at bedtime.   Turmeric 500 MG Tabs Take 500 mg by mouth daily.   venlafaxine XR 75 MG 24 hr capsule Commonly known as:  EFFEXOR-XR Take 75 mg by mouth daily.   Vitamin B-12 5000 MCG Subl Place 0.02 tablets (100 mcg total) under the tongue daily. What changed:  how much to take   WOMENS MULTIVITAMIN PO Take 1 tablet by mouth daily.         Diet and Activity recommendation: See Discharge Instructions above   Consults obtained -    Cardiology  Gastroenterology Major procedures and Radiology Reports - PLEASE review detailed and final reports for all details, in brief -     Dg Chest 2 View  Result Date: 06/09/2018 CLINICAL DATA:  Recent syncopal episode with fall at home EXAM: CHEST - 2 VIEW COMPARISON:  None. FINDINGS: Cardiac shadow is mildly enlarged. Aortic calcifications are seen. Mild central vascular congestion is noted without interstitial edema. Small right-sided pleural effusion is noted laterally. No focal infiltrate is seen. Degenerative changes of the thoracic spine are noted. IMPRESSION: Mild central vascular congestion with small right-sided pleural effusion. Electronically Signed   By: Inez Catalina M.D.   On: 06/09/2018 23:26   Dg Lumbar Spine Complete  Result Date: 06/09/2018 CLINICAL DATA:   Recent syncopal episode with low back pain, initial encounter EXAM: LUMBAR SPINE - COMPLETE 4+ VIEW COMPARISON:  None. FINDINGS: Vertebral body height is well maintained. Mild osteophytic changes are seen. L5 is partially sacralized. Mild degenerative anterolisthesis of L4 on L5 is noted. No soft tissue abnormality is noted. Aortic calcifications are seen. IMPRESSION: Mild degenerative change without acute abnormality. Electronically Signed   By: Inez Catalina M.D.   On: 06/09/2018 23:50   Ct Head Wo Contrast  Result Date: 06/10/2018 CLINICAL DATA:  Status post syncope and fall. Confusion. Concern for head or cervical spine injury. Initial encounter. EXAM: CT HEAD WITHOUT CONTRAST CT CERVICAL SPINE WITHOUT CONTRAST TECHNIQUE: Multidetector CT imaging of the head and cervical spine was performed following the standard protocol without intravenous contrast. Multiplanar CT image reconstructions of the cervical spine were also generated. COMPARISON:  None. FINDINGS: CT HEAD FINDINGS Brain: No evidence of acute infarction, hemorrhage, hydrocephalus, extra-axial collection or mass lesion / mass effect. Prominence of the ventricles and sulci reflects mild age-appropriate cortical volume loss. Mild periventricular and white matter change likely reflects small vessel ischemic microangiopathy.  The brainstem and fourth ventricle are within normal limits. The basal ganglia are unremarkable in appearance. The cerebral hemispheres demonstrate grossly normal gray-white differentiation. No mass effect or midline shift is seen. Vascular: No hyperdense vessel or unexpected calcification. Skull: There is no evidence of fracture; visualized osseous structures are unremarkable in appearance. Sinuses/Orbits: The visualized portions of the orbits are within normal limits. The paranasal sinuses and mastoid air cells are well-aerated. Other: No significant soft tissue abnormalities are seen. CT CERVICAL SPINE FINDINGS Alignment: Normal.  Skull base and vertebrae: No acute fracture. No primary bone lesion or focal pathologic process. Soft tissues and spinal canal: No prevertebral fluid or swelling. No visible canal hematoma. Disc levels: Intervertebral disc spaces are preserved. The bony foramina are grossly unremarkable. Underlying facet disease is noted. Upper chest: The visualized lung apices are clear. The thyroid gland is only partially characterized but appears grossly unremarkable. Scattered calcification is noted at the carotid bifurcations bilaterally. Other: No additional soft tissue abnormalities are seen. IMPRESSION: 1. No evidence of traumatic intracranial injury or fracture. 2. No evidence of fracture or subluxation along the cervical spine. 3. Mild age-appropriate cortical volume loss and scattered small vessel ischemic microangiopathy. 4. Scattered calcification at the carotid bifurcations bilaterally. Carotid ultrasound would be helpful for further evaluation, when and as deemed clinically appropriate. Electronically Signed   By: Garald Balding M.D.   On: 06/10/2018 00:21   Ct Cervical Spine Wo Contrast  Result Date: 06/10/2018 CLINICAL DATA:  Status post syncope and fall. Confusion. Concern for head or cervical spine injury. Initial encounter. EXAM: CT HEAD WITHOUT CONTRAST CT CERVICAL SPINE WITHOUT CONTRAST TECHNIQUE: Multidetector CT imaging of the head and cervical spine was performed following the standard protocol without intravenous contrast. Multiplanar CT image reconstructions of the cervical spine were also generated. COMPARISON:  None. FINDINGS: CT HEAD FINDINGS Brain: No evidence of acute infarction, hemorrhage, hydrocephalus, extra-axial collection or mass lesion / mass effect. Prominence of the ventricles and sulci reflects mild age-appropriate cortical volume loss. Mild periventricular and white matter change likely reflects small vessel ischemic microangiopathy. The brainstem and fourth ventricle are within  normal limits. The basal ganglia are unremarkable in appearance. The cerebral hemispheres demonstrate grossly normal gray-white differentiation. No mass effect or midline shift is seen. Vascular: No hyperdense vessel or unexpected calcification. Skull: There is no evidence of fracture; visualized osseous structures are unremarkable in appearance. Sinuses/Orbits: The visualized portions of the orbits are within normal limits. The paranasal sinuses and mastoid air cells are well-aerated. Other: No significant soft tissue abnormalities are seen. CT CERVICAL SPINE FINDINGS Alignment: Normal. Skull base and vertebrae: No acute fracture. No primary bone lesion or focal pathologic process. Soft tissues and spinal canal: No prevertebral fluid or swelling. No visible canal hematoma. Disc levels: Intervertebral disc spaces are preserved. The bony foramina are grossly unremarkable. Underlying facet disease is noted. Upper chest: The visualized lung apices are clear. The thyroid gland is only partially characterized but appears grossly unremarkable. Scattered calcification is noted at the carotid bifurcations bilaterally. Other: No additional soft tissue abnormalities are seen. IMPRESSION: 1. No evidence of traumatic intracranial injury or fracture. 2. No evidence of fracture or subluxation along the cervical spine. 3. Mild age-appropriate cortical volume loss and scattered small vessel ischemic microangiopathy. 4. Scattered calcification at the carotid bifurcations bilaterally. Carotid ultrasound would be helpful for further evaluation, when and as deemed clinically appropriate. Electronically Signed   By: Garald Balding M.D.   On: 06/10/2018 00:21   Ir Paracentesis  Result Date: 06/17/2018 INDICATION: Patient with history of chronic diastolic HF, CKD stage 4, abdominal distension, and ascites. Request is made for diagnostic and therapeutic paracentesis. EXAM: ULTRASOUND GUIDED DIAGNOSTIC AND THERAPEUTIC PARACENTESIS  MEDICATIONS: 10 mL 1% lidocaine COMPLICATIONS: None immediate. PROCEDURE: Informed written consent was obtained from the patient after a discussion of the risks, benefits and alternatives to treatment. A timeout was performed prior to the initiation of the procedure. Initial ultrasound scanning demonstrates a moderate amount of ascites within the left lower abdominal quadrant. The left lower abdomen was prepped and draped in the usual sterile fashion. 1% lidocaine was used for local anesthesia. Following this, a 19 gauge, 7-cm, Yueh catheter was introduced. An ultrasound image was saved for documentation purposes. The paracentesis was performed. The catheter was removed and a dressing was applied. The patient tolerated the procedure well without immediate post procedural complication. FINDINGS: A total of approximately 6.2 L of clear gold fluid was removed. Samples were sent to the laboratory as requested by the clinical team. IMPRESSION: Successful ultrasound-guided paracentesis yielding 6.2 L of peritoneal fluid. Read by: Earley Abide, PA-C Electronically Signed   By: Aletta Edouard M.D.   On: 06/17/2018 14:36    Micro Results   No results found for this or any previous visit (from the past 240 hour(s)).     Today   Subjective:   Madison Riggs today has no headache,no chest or abdominal pain, feels much better  today.  Excited about discharge today, reports good bowel movement after disimpaction and enema yesterday.  Objective:   Blood pressure 93/60, pulse 90, temperature 99.2 F (37.3 C), temperature source Oral, resp. rate (!) 21, height 5\' 2"  (1.575 m), weight 70.1 kg, SpO2 98 %.   Intake/Output Summary (Last 24 hours) at 06/20/2018 1447 Last data filed at 06/20/2018 1415 Gross per 24 hour  Intake 1320 ml  Output 1050 ml  Net 270 ml    Exam Awake Alert, Oriented x 3, No new F.N deficits, Normal affect, frail, chronically ill-appearing Symmetrical Chest wall movement, Good air  movement bilaterally, CTAB RRR,No Gallops,Rubs or new Murmurs, No Parasternal Heave +ve B.Sounds, Abd Soft, Non tender, ascites has resolved, No rebound -guarding or rigidity. No Cyanosis, Clubbing , +1 edema, No new Rash or bruise  Data Review   CBC w Diff:  Lab Results  Component Value Date   WBC 12.0 (H) 06/19/2018   HGB 9.2 (L) 06/20/2018   HGB 9.9 (L) 03/14/2018   HGB 10.2 (L) 01/04/2017   HCT 29.9 (L) 06/20/2018   HCT 31.2 (L) 03/14/2018   HCT 33.2 (L) 01/04/2017   PLT 213 06/19/2018   PLT 225 03/14/2018   LYMPHOPCT 10 06/18/2018   LYMPHOPCT 15.8 01/04/2017   MONOPCT 11 06/18/2018   MONOPCT 8.3 01/04/2017   EOSPCT 2 06/18/2018   EOSPCT 5.0 01/04/2017   BASOPCT 1 06/18/2018   BASOPCT 0.5 01/04/2017    CMP:  Lab Results  Component Value Date   NA 135 06/20/2018   NA 142 05/05/2018   NA 143 01/04/2017   K 3.8 06/20/2018   K 3.6 01/04/2017   CL 105 06/20/2018   CO2 23 06/20/2018   CO2 30 (H) 01/04/2017   BUN 54 (H) 06/20/2018   BUN 48 (H) 05/05/2018   BUN 37.6 (H) 01/04/2017   CREATININE 1.52 (H) 06/20/2018   CREATININE 1.6 (H) 01/04/2017   PROT 6.5 06/09/2018   PROT 6.6 02/02/2017   PROT 6.9 01/04/2017   ALBUMIN 2.5 (L) 06/09/2018  ALBUMIN 3.8 02/02/2017   ALBUMIN 3.4 (L) 01/04/2017   BILITOT 0.5 06/09/2018   BILITOT 0.3 02/02/2017   BILITOT 0.49 01/04/2017   ALKPHOS 85 06/09/2018   ALKPHOS 131 01/04/2017   AST 34 06/09/2018   AST 54 (H) 01/04/2017   ALT 21 06/09/2018   ALT 30 01/04/2017  .   Total Time in preparing paper work, data evaluation and todays exam - 34 minutes  Phillips Climes M.D on 06/20/2018 at 2:47 PM  [facility]   Office  [phone]

## 2018-06-20 NOTE — Social Work (Signed)
Patient will discharge to [facility] Anticipated discharge date: 06/20/18 Family notified: Nyanna Heideman, son Transportation by: [facility]  Nurse to call report to [phone].  CSW signing off.  Estanislado Emms, [place]  Clinical Social Worker

## 2018-06-20 NOTE — Clinical Social Work Placement (Signed)
   CLINICAL SOCIAL WORK PLACEMENT  NOTE  Date:  06/20/2018  Patient Details  Name: Madison Riggs MRN: [id number] Date of Birth: 01-29-1928  Clinical Social Work is seeking post-discharge placement for this patient at the [place] level of care (*CSW will initial, date and re-position this form in  chart as items are completed):  Yes   Patient/family provided with [place] Work Department's list of facilities offering this level of care within the geographic area requested by the patient (or if unable, by the patient's family).  Yes   Patient/family informed of their freedom to choose among providers that offer the needed level of care, that participate in Medicare, Medicaid or managed care program needed by the patient, have an available bed and are willing to accept the patient.  Yes   Patient/family informed of [facility]'s ownership interest in [facility] and [facility], as well as of the fact that they are under no obligation to receive care at these facilities.  PASRR submitted to EDS on 06/13/18     PASRR number received on 06/13/18     Existing PASRR number confirmed on       FL2 transmitted to all facilities in geographic area requested by pt/family on 06/13/18     FL2 transmitted to all facilities within larger geographic area on       Patient informed that his/her managed care company has contracts with or will negotiate with certain facilities, including the following:  [employer] informed of bed offers received.  Patient chooses bed at [facility]     Physician recommends and patient chooses bed at      Patient to be transferred to [facility] on 06/20/18.  Patient to be transferred to facility by [facility]     Patient family notified on 06/20/18 of transfer.  Name of family member notified:  Verleen Stuckey, son     PHYSICIAN       Additional Comment:     _______________________________________________ Estanislado Emms, LCSW 06/20/2018, 3:36 PM

## 2018-06-20 NOTE — Care Management Important Message (Signed)
Important Message  Patient Details  Name: Madison Riggs MRN: [id number] Date of Birth: March 27, 1928   Medicare Important Message Given:  Yes    Barb Merino Rosaland Shiffman 06/20/2018, 4:41 PM

## 2018-06-20 NOTE — Discharge Instructions (Signed)
Follow with Primary MD Rolene Course, PA-C in 7 days   Get CBC, CMP, checked  by Primary MD next visit.    Activity: As tolerated with Full fall precautions use walker/cane & assistance as needed   Disposition SNF   Diet: Heart Healthy, low salt diet with 1500 cc fluid restriction  , with feeding assistance and aspiration precautions.  For Heart failure patients - Check your Weight same time everyday, if you gain over 2 pounds, or you develop in leg swelling, experience more shortness of breath or chest pain, call your Primary MD immediately. Follow Cardiac Low Salt Diet and 1.5 lit/day fluid restriction.   On your next visit with your primary care physician please Get Medicines reviewed and adjusted.   Please request your Prim.MD to go over all Hospital Tests and Procedure/Radiological results at the follow up, please get all Hospital records sent to your Prim MD by signing hospital release before you go home.   If you experience worsening of your admission symptoms, develop shortness of breath, life threatening emergency, suicidal or homicidal thoughts you must seek medical attention immediately by calling 911 or calling your MD immediately  if symptoms less severe.  You Must read complete instructions/literature along with all the possible adverse reactions/side effects for all the Medicines you take and that have been prescribed to you. Take any new Medicines after you have completely understood and accpet all the possible adverse reactions/side effects.   Do not drive, operating heavy machinery, perform activities at heights, swimming or participation in water activities or provide baby sitting services if your were admitted for syncope or siezures until you have seen by Primary MD or a Neurologist and advised to do so again.  Do not drive when taking Pain medications.    Do not take more than prescribed Pain, Sleep and Anxiety Medications  Special Instructions: If you have  smoked or chewed Tobacco  in the last 2 yrs please stop smoking, stop any regular Alcohol  and or any Recreational drug use.  Wear Seat belts while driving.   Please note  You were cared for by a hospitalist during your hospital stay. If you have any questions about your discharge medications or the care you received while you were in the hospital after you are discharged, you can call the unit and asked to speak with the hospitalist on call if the hospitalist that took care of you is not available. Once you are discharged, your primary care physician will handle any further medical issues. Please note that NO REFILLS for any discharge medications will be authorized once you are discharged, as it is imperative that you return to your primary care physician (or establish a relationship with a primary care physician if you do not have one) for your aftercare needs so that they can reassess your need for medications and monitor your lab values.

## 2018-07-01 ENCOUNTER — Emergency Department (HOSPITAL_COMMUNITY): Payer: Medicare Other

## 2018-07-01 ENCOUNTER — Inpatient Hospital Stay (HOSPITAL_COMMUNITY)
Admission: EM | Admit: 2018-07-01 | Discharge: 2018-07-07 | DRG: 291 | Disposition: A | Discharge status: Skilled Nursing Facility | Payer: Medicare Other | Attending: Family Medicine | Admitting: Family Medicine

## 2018-07-01 ENCOUNTER — Encounter (HOSPITAL_COMMUNITY): Payer: Self-pay | Admitting: Emergency Medicine

## 2018-07-01 ENCOUNTER — Other Ambulatory Visit: Payer: Self-pay

## 2018-07-01 DIAGNOSIS — N179 Acute kidney failure, unspecified: Secondary | ICD-10-CM | POA: Diagnosis not present

## 2018-07-01 DIAGNOSIS — E1122 Type 2 diabetes mellitus with diabetic chronic kidney disease: Secondary | ICD-10-CM | POA: Diagnosis present

## 2018-07-01 DIAGNOSIS — L89152 Pressure ulcer of sacral region, stage 2: Secondary | ICD-10-CM | POA: Diagnosis present

## 2018-07-01 DIAGNOSIS — I482 Chronic atrial fibrillation, unspecified: Secondary | ICD-10-CM | POA: Diagnosis present

## 2018-07-01 DIAGNOSIS — Z515 Encounter for palliative care: Secondary | ICD-10-CM

## 2018-07-01 DIAGNOSIS — I1310 Hypertensive heart and chronic kidney disease without heart failure, with stage 1 through stage 4 chronic kidney disease, or unspecified chronic kidney disease: Secondary | ICD-10-CM | POA: Diagnosis not present

## 2018-07-01 DIAGNOSIS — F0390 Unspecified dementia without behavioral disturbance: Secondary | ICD-10-CM | POA: Diagnosis present

## 2018-07-01 DIAGNOSIS — D508 Other iron deficiency anemias: Secondary | ICD-10-CM | POA: Diagnosis not present

## 2018-07-01 DIAGNOSIS — R601 Generalized edema: Secondary | ICD-10-CM | POA: Diagnosis not present

## 2018-07-01 DIAGNOSIS — I5033 Acute on chronic diastolic (congestive) heart failure: Secondary | ICD-10-CM | POA: Diagnosis present

## 2018-07-01 DIAGNOSIS — Z66 Do not resuscitate: Secondary | ICD-10-CM | POA: Diagnosis present

## 2018-07-01 DIAGNOSIS — R188 Other ascites: Secondary | ICD-10-CM | POA: Diagnosis present

## 2018-07-01 DIAGNOSIS — Z9884 Bariatric surgery status: Secondary | ICD-10-CM

## 2018-07-01 DIAGNOSIS — E039 Hypothyroidism, unspecified: Secondary | ICD-10-CM | POA: Diagnosis present

## 2018-07-01 DIAGNOSIS — E876 Hypokalemia: Secondary | ICD-10-CM | POA: Diagnosis not present

## 2018-07-01 DIAGNOSIS — G4730 Sleep apnea, unspecified: Secondary | ICD-10-CM | POA: Diagnosis present

## 2018-07-01 DIAGNOSIS — I130 Hypertensive heart and chronic kidney disease with heart failure and stage 1 through stage 4 chronic kidney disease, or unspecified chronic kidney disease: Secondary | ICD-10-CM | POA: Diagnosis not present

## 2018-07-01 DIAGNOSIS — B029 Zoster without complications: Secondary | ICD-10-CM | POA: Diagnosis present

## 2018-07-01 DIAGNOSIS — E440 Moderate protein-calorie malnutrition: Secondary | ICD-10-CM | POA: Diagnosis present

## 2018-07-01 DIAGNOSIS — I9589 Other hypotension: Secondary | ICD-10-CM | POA: Diagnosis present

## 2018-07-01 DIAGNOSIS — N184 Chronic kidney disease, stage 4 (severe): Secondary | ICD-10-CM | POA: Diagnosis present

## 2018-07-01 DIAGNOSIS — K7460 Unspecified cirrhosis of liver: Secondary | ICD-10-CM | POA: Diagnosis present

## 2018-07-01 DIAGNOSIS — F329 Major depressive disorder, single episode, unspecified: Secondary | ICD-10-CM | POA: Diagnosis present

## 2018-07-01 DIAGNOSIS — D509 Iron deficiency anemia, unspecified: Secondary | ICD-10-CM | POA: Diagnosis present

## 2018-07-01 DIAGNOSIS — I350 Nonrheumatic aortic (valve) stenosis: Secondary | ICD-10-CM | POA: Diagnosis present

## 2018-07-01 DIAGNOSIS — G4733 Obstructive sleep apnea (adult) (pediatric): Secondary | ICD-10-CM | POA: Diagnosis present

## 2018-07-01 DIAGNOSIS — M109 Gout, unspecified: Secondary | ICD-10-CM | POA: Diagnosis present

## 2018-07-01 DIAGNOSIS — K219 Gastro-esophageal reflux disease without esophagitis: Secondary | ICD-10-CM | POA: Diagnosis present

## 2018-07-01 DIAGNOSIS — L8990 Pressure ulcer of unspecified site, unspecified stage: Secondary | ICD-10-CM

## 2018-07-01 DIAGNOSIS — I10 Essential (primary) hypertension: Secondary | ICD-10-CM | POA: Diagnosis present

## 2018-07-01 DIAGNOSIS — D631 Anemia in chronic kidney disease: Secondary | ICD-10-CM | POA: Diagnosis present

## 2018-07-01 DIAGNOSIS — Z6830 Body mass index (BMI) 30.0-30.9, adult: Secondary | ICD-10-CM

## 2018-07-01 DIAGNOSIS — E7800 Pure hypercholesterolemia, unspecified: Secondary | ICD-10-CM | POA: Diagnosis present

## 2018-07-01 DIAGNOSIS — Z7189 Other specified counseling: Secondary | ICD-10-CM | POA: Diagnosis not present

## 2018-07-01 DIAGNOSIS — E785 Hyperlipidemia, unspecified: Secondary | ICD-10-CM | POA: Diagnosis present

## 2018-07-01 DIAGNOSIS — Z79899 Other long term (current) drug therapy: Secondary | ICD-10-CM

## 2018-07-01 DIAGNOSIS — I5032 Chronic diastolic (congestive) heart failure: Secondary | ICD-10-CM | POA: Diagnosis not present

## 2018-07-01 DIAGNOSIS — T502X5A Adverse effect of carbonic-anhydrase inhibitors, benzothiadiazides and other diuretics, initial encounter: Secondary | ICD-10-CM | POA: Diagnosis not present

## 2018-07-01 DIAGNOSIS — E1129 Type 2 diabetes mellitus with other diabetic kidney complication: Secondary | ICD-10-CM | POA: Diagnosis present

## 2018-07-01 DIAGNOSIS — Z79890 Hormone replacement therapy (postmenopausal): Secondary | ICD-10-CM

## 2018-07-01 LAB — URINALYSIS, ROUTINE W REFLEX MICROSCOPIC
Bilirubin Urine: NEGATIVE
Glucose, UA: NEGATIVE mg/dL
Hgb urine dipstick: NEGATIVE
Ketones, ur: NEGATIVE mg/dL
Nitrite: NEGATIVE
Protein, ur: NEGATIVE mg/dL
Specific Gravity, Urine: 1.011 (ref 1.005–1.030)
pH: 5 (ref 5.0–8.0)

## 2018-07-01 LAB — CBC WITH DIFFERENTIAL/PLATELET
Abs Immature Granulocytes: 0.05 10*3/uL (ref 0.00–0.07)
BASOS ABS: 0 10*3/uL (ref 0.0–0.1)
Basophils Relative: 0 %
Eosinophils Absolute: 0.1 10*3/uL (ref 0.0–0.5)
Eosinophils Relative: 2 %
HCT: 28.3 % — ABNORMAL LOW (ref 36.0–46.0)
Hemoglobin: 8.4 g/dL — ABNORMAL LOW (ref 12.0–15.0)
Immature Granulocytes: 1 %
LYMPHS ABS: 0.6 10*3/uL — AB (ref 0.7–4.0)
Lymphocytes Relative: 8 %
MCH: 30.1 pg (ref 26.0–34.0)
MCHC: 29.7 g/dL — ABNORMAL LOW (ref 30.0–36.0)
MCV: 101.4 fL — ABNORMAL HIGH (ref 80.0–100.0)
MONOS PCT: 6 %
Monocytes Absolute: 0.5 10*3/uL (ref 0.1–1.0)
Neutro Abs: 5.9 10*3/uL (ref 1.7–7.7)
Neutrophils Relative %: 83 %
Platelets: 245 10*3/uL (ref 150–400)
RBC: 2.79 MIL/uL — ABNORMAL LOW (ref 3.87–5.11)
RDW: 19.4 % — ABNORMAL HIGH (ref 11.5–15.5)
WBC: 7.2 10*3/uL (ref 4.0–10.5)
nRBC: 0 % (ref 0.0–0.2)

## 2018-07-01 LAB — COMPREHENSIVE METABOLIC PANEL
ALT: 25 U/L (ref 0–44)
AST: 39 U/L (ref 15–41)
Albumin: 2.2 g/dL — ABNORMAL LOW (ref 3.5–5.0)
Alkaline Phosphatase: 110 U/L (ref 38–126)
Anion gap: 9 (ref 5–15)
BUN: 61 mg/dL — ABNORMAL HIGH (ref 8–23)
CHLORIDE: 109 mmol/L (ref 98–111)
CO2: 19 mmol/L — AB (ref 22–32)
Calcium: 7.9 mg/dL — ABNORMAL LOW (ref 8.9–10.3)
Creatinine, Ser: 1.87 mg/dL — ABNORMAL HIGH (ref 0.44–1.00)
GFR calc Af Amer: 27 mL/min — ABNORMAL LOW (ref >60)
GFR calc non Af Amer: 24 mL/min — ABNORMAL LOW (ref >60)
Glucose, Bld: 80 mg/dL (ref 70–99)
Potassium: 5.1 mmol/L (ref 3.5–5.1)
Sodium: 137 mmol/L (ref 135–145)
Total Bilirubin: 0.5 mg/dL (ref 0.3–1.2)
Total Protein: 5.4 g/dL — ABNORMAL LOW (ref 6.5–8.1)

## 2018-07-01 LAB — I-STAT CG4 LACTIC ACID, ED
Lactic Acid, Venous: 0.67 mmol/L (ref 0.5–1.9)
Lactic Acid, Venous: 0.54 mmol/L (ref 0.5–1.9)

## 2018-07-01 LAB — TROPONIN I: Troponin I: <0.03 ng/mL (ref <0.03)

## 2018-07-01 MED ORDER — SODIUM CHLORIDE 0.9 % IV BOLUS
1000.0000 mL | Freq: Once | INTRAVENOUS | Status: AC
  Administered 2018-07-01: 1000 mL via INTRAVENOUS

## 2018-07-01 MED ORDER — BISACODYL 5 MG PO TBEC: 5.0000 mg | DELAYED_RELEASE_TABLET | Freq: Every day | ORAL | Status: DC | PRN

## 2018-07-01 MED ORDER — MELATONIN 3 MG PO TABS
3.0000 mg | ORAL_TABLET | Freq: Every day | ORAL | Status: DC
  Administered 2018-07-01 – 2018-07-06 (×6): 3 mg via ORAL
  Filled 2018-07-01 (×7): qty 1

## 2018-07-01 MED ORDER — HYDROXYZINE HCL 25 MG PO TABS: 25.0000 mg | ORAL_TABLET | Freq: Three times a day (TID) | ORAL | Status: DC | PRN

## 2018-07-01 MED ORDER — SENNOSIDES-DOCUSATE SODIUM 8.6-50 MG PO TABS
1.0000 | ORAL_TABLET | Freq: Two times a day (BID) | ORAL | Status: DC
  Administered 2018-07-01 – 2018-07-07 (×12): 1 via ORAL
  Filled 2018-07-01 (×12): qty 1

## 2018-07-01 MED ORDER — VENLAFAXINE HCL ER 75 MG PO CP24
75.0000 mg | ORAL_CAPSULE | Freq: Every day | ORAL | Status: DC
  Administered 2018-07-02 – 2018-07-07 (×6): 75 mg via ORAL
  Filled 2018-07-01 (×6): qty 1

## 2018-07-01 MED ORDER — CAMPHOR-MENTHOL 0.5-0.5 % EX LOTN
1.0000 "application " | TOPICAL_LOTION | Freq: Three times a day (TID) | CUTANEOUS | Status: DC | PRN
  Filled 2018-07-01: qty 222

## 2018-07-01 MED ORDER — NEPRO/CARBSTEADY PO LIQD
237.0000 mL | Freq: Three times a day (TID) | ORAL | Status: DC | PRN
  Filled 2018-07-01: qty 237

## 2018-07-01 MED ORDER — ACETAMINOPHEN 325 MG PO TABS
650.0000 mg | ORAL_TABLET | Freq: Four times a day (QID) | ORAL | Status: DC | PRN
  Administered 2018-07-03 – 2018-07-07 (×2): 650 mg via ORAL
  Filled 2018-07-01 (×2): qty 2

## 2018-07-01 MED ORDER — LEVOTHYROXINE SODIUM 100 MCG PO TABS
100.0000 ug | ORAL_TABLET | Freq: Every day | ORAL | Status: DC
  Administered 2018-07-02 – 2018-07-07 (×6): 100 ug via ORAL
  Filled 2018-07-01 (×6): qty 1

## 2018-07-01 MED ORDER — FUROSEMIDE 10 MG/ML IJ SOLN
40.0000 mg | INTRAMUSCULAR | Status: AC
  Administered 2018-07-01: 40 mg via INTRAVENOUS
  Filled 2018-07-01: qty 4

## 2018-07-01 MED ORDER — SORBITOL 70 % SOLN
30.0000 mL | Status: DC | PRN
  Filled 2018-07-01: qty 30

## 2018-07-01 MED ORDER — TRAZODONE HCL 100 MG PO TABS
100.0000 mg | ORAL_TABLET | Freq: Every day | ORAL | Status: DC
  Administered 2018-07-01 – 2018-07-06 (×6): 100 mg via ORAL
  Filled 2018-07-01 (×6): qty 1

## 2018-07-01 MED ORDER — PANTOPRAZOLE SODIUM 40 MG PO TBEC
40.0000 mg | DELAYED_RELEASE_TABLET | Freq: Two times a day (BID) | ORAL | Status: DC
  Administered 2018-07-01 – 2018-07-07 (×12): 40 mg via ORAL
  Filled 2018-07-01 (×12): qty 1

## 2018-07-01 MED ORDER — ONDANSETRON HCL 4 MG/2ML IJ SOLN: 4.0000 mg | Freq: Four times a day (QID) | INTRAMUSCULAR | Status: DC | PRN

## 2018-07-01 MED ORDER — FLUTICASONE PROPIONATE 50 MCG/ACT NA SUSP
1.0000 | Freq: Every day | NASAL | Status: DC
  Administered 2018-07-02 – 2018-07-07 (×6): 1 via NASAL
  Filled 2018-07-01: qty 16

## 2018-07-01 MED ORDER — LORATADINE 10 MG PO TABS
10.0000 mg | ORAL_TABLET | Freq: Every day | ORAL | Status: DC
  Administered 2018-07-02 – 2018-07-07 (×6): 10 mg via ORAL
  Filled 2018-07-01 (×6): qty 1

## 2018-07-01 MED ORDER — DOCUSATE SODIUM 283 MG RE ENEM
1.0000 | ENEMA | RECTAL | Status: DC | PRN
  Filled 2018-07-01: qty 1

## 2018-07-01 MED ORDER — ALLOPURINOL 100 MG PO TABS
100.0000 mg | ORAL_TABLET | Freq: Two times a day (BID) | ORAL | Status: DC
  Administered 2018-07-01 – 2018-07-07 (×12): 100 mg via ORAL
  Filled 2018-07-01 (×12): qty 1

## 2018-07-01 MED ORDER — SIMVASTATIN 20 MG PO TABS
20.0000 mg | ORAL_TABLET | Freq: Every day | ORAL | Status: DC
  Administered 2018-07-02 – 2018-07-07 (×6): 20 mg via ORAL
  Filled 2018-07-01 (×6): qty 1

## 2018-07-01 MED ORDER — MIDODRINE HCL 5 MG PO TABS
10.0000 mg | ORAL_TABLET | Freq: Three times a day (TID) | ORAL | Status: DC
  Administered 2018-07-02 – 2018-07-07 (×17): 10 mg via ORAL
  Filled 2018-07-01 (×17): qty 2

## 2018-07-01 MED ORDER — FUROSEMIDE 10 MG/ML IJ SOLN
40.0000 mg | Freq: Two times a day (BID) | INTRAMUSCULAR | Status: DC
  Administered 2018-07-02 – 2018-07-04 (×5): 40 mg via INTRAVENOUS
  Filled 2018-07-01 (×7): qty 4

## 2018-07-01 MED ORDER — ACETAMINOPHEN 650 MG RE SUPP: 650.0000 mg | Freq: Four times a day (QID) | RECTAL | Status: DC | PRN

## 2018-07-01 MED ORDER — ONDANSETRON HCL 4 MG PO TABS: 4.0000 mg | ORAL_TABLET | Freq: Four times a day (QID) | ORAL | Status: DC | PRN

## 2018-07-01 MED ORDER — GABAPENTIN 100 MG PO CAPS
100.0000 mg | ORAL_CAPSULE | Freq: Three times a day (TID) | ORAL | Status: DC
  Administered 2018-07-01 – 2018-07-02 (×2): 100 mg via ORAL
  Filled 2018-07-01 (×2): qty 1

## 2018-07-01 MED ORDER — MIDODRINE HCL 5 MG PO TABS: 10.0000 mg | ORAL_TABLET | Freq: Two times a day (BID) | ORAL | Status: DC

## 2018-07-01 MED ORDER — ZOLPIDEM TARTRATE 5 MG PO TABS: 5.0000 mg | ORAL_TABLET | Freq: Every evening | ORAL | Status: DC | PRN

## 2018-07-01 MED ORDER — CALCIUM CARBONATE ANTACID 1250 MG/5ML PO SUSP: 500.0000 mg | Freq: Four times a day (QID) | ORAL | Status: DC | PRN

## 2018-07-01 MED ORDER — DARBEPOETIN ALFA 200 MCG/0.4ML IJ SOSY
200.0000 ug | PREFILLED_SYRINGE | INTRAMUSCULAR | Status: DC
  Administered 2018-07-02: 200 ug via SUBCUTANEOUS
  Filled 2018-07-01: qty 0.4

## 2018-07-01 MED ORDER — SODIUM CHLORIDE 0.9 % IV SOLN
125.0000 mg | Freq: Every day | INTRAVENOUS | Status: AC
  Administered 2018-07-01 – 2018-07-05 (×5): 125 mg via INTRAVENOUS
  Filled 2018-07-01 (×5): qty 10

## 2018-07-01 MED ORDER — AMIODARONE HCL 200 MG PO TABS
200.0000 mg | ORAL_TABLET | Freq: Every day | ORAL | Status: DC
  Administered 2018-07-02 – 2018-07-07 (×6): 200 mg via ORAL
  Filled 2018-07-01 (×6): qty 1

## 2018-07-01 NOTE — ED Provider Notes (Signed)
[facility] EMERGENCY DEPARTMENT Provider Note   CSN: [id number] Arrival date & time: 07/01/18  1140     History   Chief Complaint No chief complaint on file.   HPI ZAKEYA JUNKER is a 85 y.o. female.  HPI   The patient is an 85 year old female, she has a known history of chronic kidney disease stage III, diabetes, she has some type of cirrhosis and has undergone paracentesis in the past.  She has moderate aortic stenosis.  She presents today after having increasing swelling of her legs increasing weakness and known to be hypotensive for the last 2 days.  She is currently in a rehabilitation facility and was sent here from there because of the hypotension and weakness.  The patient denies feeling short of breath coughing fevers or diarrhea.  She feels like she is progressively weak and feeling dehydrated.  Symptoms are persistent and gradually worsening  Past Medical History:  Diagnosis Date  . Aortic stenosis    moderate  . CKD (chronic kidney disease) stage 3, GFR 30-59 ml/min (HCC)   . Depression   . DM (diabetes mellitus), type 2 ([place])   . Gout   . HTN (hypertension)   . Hypercholesterolemia   . Hypothyroidism   . Iron deficiency anemia   . Sleep apnea    CPAP  . Syncope 06/10/2018    Patient Active Problem List   Diagnosis Date Noted  . New onset atrial fibrillation ([place]) 06/10/2018  . Atrial fibrillation with RVR ([place]) 06/10/2018  . HLD (hyperlipidemia) 06/10/2018  . GERD (gastroesophageal reflux disease) 06/10/2018  . Hypothyroidism 06/10/2018  . Fall 06/10/2018  . Positive occult stool blood test 06/10/2018  . Contusion of right elbow 03/02/2018  . Pain in joint of right elbow 03/02/2018  . Anasarca 02/28/2018  . CKD (chronic kidney disease), stage IV ([place]) 02/28/2018  . Chronic diastolic HF (heart failure) ([place]) 10/30/2016  . Abnormal EKG 10/30/2016  . Nonrheumatic aortic valve stenosis 10/30/2016  . Iron deficiency anemia 10/16/2016    . Acute renal failure (ARF) ([place]) 10/16/2016  . Hypertension 10/16/2016  . Type II diabetes mellitus with renal manifestations ([place]) 10/16/2016  . Dyspnea and respiratory abnormalities 06/02/2016    Past Surgical History:  Procedure Laterality Date  . BACK SURGERY  1984  . CATARACT EXTRACTION, BILATERAL  1997  . CHOLECYSTECTOMY  2001  . ESOPHAGOGASTRODUODENOSCOPY N/A 10/18/2016   Procedure: ESOPHAGOGASTRODUODENOSCOPY (EGD);  Surgeon: Teena Irani, MD;  Location: [facility];  Service: Endoscopy;  Laterality: N/A;  . GASTRIC BYPASS  1999  . IR PARACENTESIS  02/09/2017  . IR PARACENTESIS  06/17/2018  . KNEE SURGERY Left 1994  . THORACENTESIS Right    in [place], [place]  . TONSILLECTOMY       OB History   No obstetric history on file.      Home Medications    Prior to Admission medications   Medication Sig Start Date End Date Taking? Authorizing Provider  acetaminophen (TYLENOL) 325 MG tablet Take 2 tablets (650 mg total) by mouth every 6 (six) hours as needed for mild pain (or Fever >/= 101). 06/20/18  Yes Elgergawy, Silver Huguenin, MD  allopurinol (ZYLOPRIM) 100 MG tablet Take 100 mg by mouth 2 (two) times daily.     [provider]  amiodarone (PACERONE) 200 MG tablet Use 200 mg PO BID for next 5 days, then transition to 200 mg oral daily on 06/26/2018 06/20/18   Elgergawy, Silver Huguenin, MD  bisacodyl (DULCOLAX) 5  MG EC tablet Take 1 tablet (5 mg total) by mouth daily as needed for moderate constipation. 06/20/18   Elgergawy, Silver Huguenin, MD  calcium carbonate (OS-CAL) 600 MG TABS tablet Take 600 mg by mouth daily.    [provider]  cetirizine (ZYRTEC) 10 MG tablet Take 10 mg by mouth at bedtime.     [provider]  Cyanocobalamin (VITAMIN B-12) 5000 MCG SUBL Place 0.02 tablets (100 mcg total) under the tongue daily. 06/20/18   Elgergawy, Silver Huguenin, MD  feeding supplement, ENSURE ENLIVE, (ENSURE ENLIVE) LIQD Take 237 mLs by mouth 2 (two) times daily between meals. 06/21/18    Elgergawy, Silver Huguenin, MD  ferrous sulfate 325 (65 FE) MG tablet Take 1 tablet (325 mg total) by mouth daily with breakfast. 06/20/18   Elgergawy, Silver Huguenin, MD  fluticasone (FLONASE) 50 MCG/ACT nasal spray Place 1 spray into both nostrils daily.  11/18/16   [provider]  Lactobacillus (ACIDOPHILUS) 100 MG CAPS Take 100 mg by mouth daily.     [provider]  Magnesium 400 MG TABS Take 400 mg by mouth daily.     [provider]  midodrine (PROAMATINE) 5 MG tablet Take 1 tablet (5 mg total) by mouth 2 (two) times daily with a meal. 06/20/18   Elgergawy, Silver Huguenin, MD  Multiple Vitamins-Minerals (WOMENS MULTIVITAMIN PO) Take 1 tablet by mouth daily.    [provider]  pantoprazole (PROTONIX) 40 MG tablet Take 1 tablet (40 mg total) by mouth 2 (two) times daily. Patient taking differently: Take 40 mg by mouth daily.  10/19/16   Doreatha Lew, MD  potassium chloride SA (KLOR-CON M20) 20 MEQ tablet Take 1 tablet (20 mEq total) by mouth daily. 06/20/18   Elgergawy, Silver Huguenin, MD  senna-docusate (SENOKOT-S) 8.6-50 MG tablet Take 1 tablet by mouth 2 (two) times daily. Patient taking differently: Take 1 tablet by mouth at bedtime.  10/19/16   Doreatha Lew, MD  simvastatin (ZOCOR) 40 MG tablet Take 20 mg by mouth daily.    [provider]  SYNTHROID 100 MCG tablet Take 100 mcg by mouth daily before breakfast.  07/12/17   [provider]  torsemide (DEMADEX) 20 MG tablet Take 2 tablets (40 mg total) by mouth daily. 06/20/18 07/20/18  Elgergawy, Silver Huguenin, MD  traZODone (DESYREL) 50 MG tablet Take 100 mg by mouth at bedtime.     [provider]  Turmeric 500 MG TABS Take 500 mg by mouth daily.     [provider]  venlafaxine XR (EFFEXOR-XR) 75 MG 24 hr capsule Take 75 mg by mouth daily.    [provider]    Family History Family History  Problem Relation Age of Onset  . Breast cancer Mother   . Colon cancer Father   . Prostate  cancer Father   . Emphysema Father     Social History Social History   Tobacco Use  . Smoking status: Never Smoker  . Smokeless tobacco: Never Used  Substance Use Topics  . Alcohol use: No  . Drug use: No     Allergies   Patient has no known allergies.   Review of Systems Review of Systems  All other systems reviewed and are negative.    Physical Exam Updated Vital Signs BP (!) 94/58   Pulse 83   Temp 97.6 F (36.4 C) (Oral)   Resp 10   Ht 1.575 m (5\' 2" )   Wt 70.1 kg  SpO2 98%   BMI 28.27 kg/m   Physical Exam Vitals signs and nursing note reviewed.  Constitutional:      General: She is not in acute distress.    Appearance: She is well-developed.  HENT:     Head: Normocephalic and atraumatic.     Mouth/Throat:     Pharynx: No oropharyngeal exudate.  Eyes:     General: No scleral icterus.       Right eye: No discharge.        Left eye: No discharge.     Conjunctiva/sclera: Conjunctivae normal.     Pupils: Pupils are equal, round, and reactive to light.  Neck:     Musculoskeletal: Normal range of motion and neck supple.     Thyroid: No thyromegaly.     Vascular: No JVD.  Cardiovascular:     Rate and Rhythm: Normal rate and regular rhythm.     Heart sounds: Normal heart sounds. No murmur. No friction rub. No gallop.   Pulmonary:     Effort: Pulmonary effort is normal. No respiratory distress.     Breath sounds: Rales present. No wheezing.  Abdominal:     General: Bowel sounds are normal. There is no distension.     Palpations: Abdomen is soft. There is no mass.     Tenderness: There is no abdominal tenderness.  Musculoskeletal: Normal range of motion.        General: No tenderness.     Right lower leg: Edema present.     Left lower leg: Edema present.  Lymphadenopathy:     Cervical: No cervical adenopathy.  Skin:    General: Skin is warm and dry.     Findings: No erythema or rash.  Neurological:     Mental Status: She is alert.      Coordination: Coordination normal.  Psychiatric:        Behavior: Behavior normal.      ED Treatments / Results  Labs (all labs ordered are listed, but only abnormal results are displayed) Labs Reviewed  COMPREHENSIVE METABOLIC PANEL - Abnormal; Notable for the following components:      Result Value   CO2 19 (*)    BUN 61 (*)    Creatinine, Ser 1.87 (*)    Calcium 7.9 (*)    Total Protein 5.4 (*)    Albumin 2.2 (*)    GFR calc non Af Amer 24 (*)    GFR calc Af Amer 27 (*)    All other components within normal limits  CBC WITH DIFFERENTIAL/PLATELET - Abnormal; Notable for the following components:   RBC 2.79 (*)    Hemoglobin 8.4 (*)    HCT 28.3 (*)    MCV 101.4 (*)    MCHC 29.7 (*)    RDW 19.4 (*)    Lymphs Abs 0.6 (*)    All other components within normal limits  URINALYSIS, ROUTINE W REFLEX MICROSCOPIC - Abnormal; Notable for the following components:   Leukocytes, UA TRACE (*)    Bacteria, UA RARE (*)    All other components within normal limits  CULTURE, BLOOD (ROUTINE X 2)  CULTURE, BLOOD (ROUTINE X 2)  TROPONIN I  I-STAT CG4 LACTIC ACID, ED    EKG EKG Interpretation  Date/Time:  Friday July 01 2018 12:43:45 EST Ventricular Rate:  85 PR Interval:    QRS Duration: 84 QT Interval:  384 QTC Calculation: 457 R Axis:   -18 Text Interpretation:  Atrial fibrillation Paired ventricular premature complexes  Anterior infarct, old Artifact in lead(s) I III aVR aVL aVF since last tracing no significant change Confirmed by Noemi Chapel [phone]) on 07/01/2018 1:26:32 PM   Radiology Dg Chest Port 1 View  Result Date: 07/01/2018 CLINICAL DATA:  Rales. EXAM: PORTABLE CHEST 1 VIEW COMPARISON:  Radiographs of June 09, 2018. FINDINGS: Stable cardiomegaly with central pulmonary vascular congestion. No pneumothorax is noted. Possible minimal bibasilar edema is noted. Minimal right pleural effusion is noted. Bony thorax is unremarkable. IMPRESSION: Stable cardiomegaly  with central pulmonary vascular congestion and possible minimal bibasilar edema. Stable minimal right pleural effusion. Electronically Signed   By: Marijo Conception, M.D.   On: 07/01/2018 13:19    Procedures Procedures (including critical care time)  Medications Ordered in ED Medications  sodium chloride 0.9 % bolus 1,000 mL (0 mLs Intravenous Stopped 07/01/18 1337)  furosemide (LASIX) injection 40 mg (40 mg Intravenous Given 07/01/18 1511)     Initial Impression / Assessment and Plan / ED Course  I have reviewed the triage vital signs and the nursing notes.  Pertinent labs & imaging results that were available during my care of the patient were reviewed by me and considered in my medical decision making (see chart for details).  Clinical Course as of Jul 02 1543  Fri Jul 01, 2018  1409 Hemoglobin seems to be close to baseline at 8.4   [BM]    Clinical Course User Index [BM] Noemi Chapel, MD    The patient has some subtle rales at the bases but no increased work of breathing.  She has bilateral significant pitting edema which is symmetrical.  She appears fluid overloaded and I suspect she is third spacing from liver failure however she may have some progressive renal dysfunction dehydration or other infection that is spurring the hypotension.  Fluids have been ordered, EKG troponin monitoring labs.  I discussed the case with Dr. Karmen Bongo who will admit the patient to the hospital for ongoing significant fluid overload, anasarca and possibly cardiorenal syndrome.  At this time the patient's renal function seems to be close to baseline, urinalysis is unremarkable and lactic acid is normal.  Blood cultures were obtained however I do not think the patient has sepsis, I think this is more likely related to poor liver function, renal function and progressive decline.  Final Clinical Impressions(s) / ED Diagnoses   Final diagnoses:  Anasarca  Other specified hypotension        Noemi Chapel, MD 07/01/18 1545

## 2018-07-01 NOTE — Progress Notes (Signed)
Arrived to 6n6 from ED at this time. Pt with stg 2 decub on sacral(1-2cm). With surrounding area red. Foam applied at this time. Pt with right chest scabbed rash with additional several small scabs on abd.. Dried rash on upper mid back. Several bumps on left thigh dried. 1 bump on inner right thigh oozing, pink foam applied.[place] spoke with Judeen Hammans in Infection Prevention. No need for contact or airborne per [facility]. Will continue to monitor.

## 2018-07-01 NOTE — ED Triage Notes (Signed)
[facility], retaining fluid in abdomen and legs. Pt has shingles. Pt has been hypotensive x 2 days. 98/64.

## 2018-07-01 NOTE — Consult Note (Signed)
[facility] Renal Consultation Note  Requesting MD: Lorin Mercy Indication for Consultation: anasarca- CKD  HPI:  Madison Riggs is a 85 y.o. female with  OSA on bipap, diastolic heart failure with some A fib, s/p gastric bypass in remote past, hypothyroidism, DM, possibly mild dementia.  I have seen her in the office for stage 3 CKD crt in the mid ones that is also volume dependent- I last saw her in August 2019 - her weight was 166 or 75 kg- her albumin was OK- volume overload had been a chronic issue for her.  We narrowed it down to her drinking too much due to habit and dementia and felt it was going to be difficult to treat.  It was mainly in her LE's and did not bother her.  She was admitted from 12/26 to 1/6 for A fib and volume - she was diuresed- discharge weight was 70.1 kg- echo just showed diastolic dysfunction- she was sent out on po torsemide a lower dose than OP previously and sent to facility.  She returns today supposedly gained 8-9 pounds but weight is actually same today at 70 kg  (of note when I saw her in August she was 166 pounds or 75 kg).  Pt had never been hypoalbuminemic in the past but now is.  She is also hypotensive on no BP meds- on midodrine-  Supposedly when diuretics are increased BP worsens.  This is a difficult situation- also recent shingles and anemia.  She is not c/o anything at present- just wants something to eat- she thinks swelling is same  Creatinine  Date/Time Value Ref Range Status  01/04/2017 01:43 PM 1.6 (H) 0.6 - 1.1 mg/dL Final  11/02/2016 03:47 PM 1.5 (H) 0.6 - 1.1 mg/dL Final   Creatinine, Ser  Date/Time Value Ref Range Status  07/01/2018 12:49 PM 1.87 (H) 0.44 - 1.00 mg/dL Final  06/20/2018 06:57 AM 1.52 (H) 0.44 - 1.00 mg/dL Final  06/19/2018 04:34 AM 1.76 (H) 0.44 - 1.00 mg/dL Final  06/18/2018 03:53 AM 1.87 (H) 0.44 - 1.00 mg/dL Final  06/17/2018 08:43 AM 1.86 (H) 0.44 - 1.00 mg/dL Final  06/16/2018 03:35 AM 1.95 (H) 0.44 - 1.00  mg/dL Final  06/15/2018 04:01 AM 1.98 (H) 0.44 - 1.00 mg/dL Final  06/14/2018 04:02 AM 2.11 (H) 0.44 - 1.00 mg/dL Final  06/13/2018 04:23 AM 1.84 (H) 0.44 - 1.00 mg/dL Final  06/12/2018 05:07 AM 1.86 (H) 0.44 - 1.00 mg/dL Final  06/11/2018 03:32 AM 1.71 (H) 0.44 - 1.00 mg/dL Final  06/10/2018 07:34 AM 1.68 (H) 0.44 - 1.00 mg/dL Final  06/09/2018 10:38 PM 1.86 (H) 0.44 - 1.00 mg/dL Final  05/05/2018 03:51 PM 1.59 (H) 0.57 - 1.00 mg/dL Final  04/19/2018 11:13 AM 1.44 (H) 0.57 - 1.00 mg/dL Final  03/29/2018 02:49 PM 1.40 (H) 0.57 - 1.00 mg/dL Final  03/03/2018 08:25 AM 1.52 (H) 0.57 - 1.00 mg/dL Final  08/19/2017 04:04 PM 1.48 (H) 0.57 - 1.00 mg/dL Final  10/19/2016 05:23 AM 1.31 (H) 0.44 - 1.00 mg/dL Final  10/18/2016 05:12 AM 1.25 (H) 0.44 - 1.00 mg/dL Final  10/17/2016 05:53 AM 1.37 (H) 0.44 - 1.00 mg/dL Final  10/16/2016 04:40 PM 1.62 (H) 0.44 - 1.00 mg/dL Final     PMHx:   Past Medical History:  Diagnosis Date  . Aortic stenosis    moderate  . CKD (chronic kidney disease) stage 3, GFR 30-59 ml/min (HCC)   . Depression   . DM (diabetes mellitus), type  2 ([place])   . Gout   . HTN (hypertension)   . Hypercholesterolemia   . Hypothyroidism   . Iron deficiency anemia   . Sleep apnea    CPAP  . Syncope 06/10/2018    Past Surgical History:  Procedure Laterality Date  . BACK SURGERY  1984  . CATARACT EXTRACTION, BILATERAL  1997  . CHOLECYSTECTOMY  2001  . ESOPHAGOGASTRODUODENOSCOPY N/A 10/18/2016   Procedure: ESOPHAGOGASTRODUODENOSCOPY (EGD);  Surgeon: Teena Irani, MD;  Location: [facility];  Service: Endoscopy;  Laterality: N/A;  . GASTRIC BYPASS  1999  . IR PARACENTESIS  02/09/2017  . IR PARACENTESIS  06/17/2018  . KNEE SURGERY Left 1994  . THORACENTESIS Right    in [place], [place]  . TONSILLECTOMY      Family Hx:  Family History  Problem Relation Age of Onset  . Breast cancer Mother   . Colon cancer Father   . Prostate cancer Father   . Emphysema Father      Social History:  reports that she has never smoked. She has never used smokeless tobacco. She reports that she does not drink alcohol or use drugs.  Allergies: No Known Allergies  Medications: Prior to Admission medications   Medication Sig Start Date End Date Taking? Authorizing Provider  acetaminophen (TYLENOL) 325 MG tablet Take 2 tablets (650 mg total) by mouth every 6 (six) hours as needed for mild pain (or Fever >/= 101). 06/20/18  Yes Elgergawy, Silver Huguenin, MD  allopurinol (ZYLOPRIM) 100 MG tablet Take 100 mg by mouth 2 (two) times daily.    Yes [provider]  amiodarone (PACERONE) 200 MG tablet Use 200 mg PO BID for next 5 days, then transition to 200 mg oral daily on 06/26/2018 06/20/18  Yes Elgergawy, Silver Huguenin, MD  bisacodyl (DULCOLAX) 5 MG EC tablet Take 1 tablet (5 mg total) by mouth daily as needed for moderate constipation. 06/20/18  Yes Elgergawy, Silver Huguenin, MD  calcium carbonate (OS-CAL) 600 MG TABS tablet Take 600 mg by mouth daily.   Yes [provider]  cetirizine (ZYRTEC) 10 MG tablet Take 10 mg by mouth at bedtime.    Yes [provider]  Cyanocobalamin (VITAMIN B-12) 5000 MCG SUBL Place 0.02 tablets (100 mcg total) under the tongue daily. 06/20/18  Yes Elgergawy, Silver Huguenin, MD  diphenhydrAMINE (BENADRYL) 25 MG tablet Take 12.5 mg by mouth every 8 (eight) hours as needed for itching.   Yes [provider]  ferrous sulfate 325 (65 FE) MG tablet Take 1 tablet (325 mg total) by mouth daily with breakfast. 06/20/18  Yes Elgergawy, Silver Huguenin, MD  fluticasone (FLONASE) 50 MCG/ACT nasal spray Place 1 spray into both nostrils daily.  11/18/16  Yes [provider]  gabapentin (NEURONTIN) 100 MG capsule Take 100 mg by mouth 3 (three) times daily.   Yes [provider]  Magnesium 400 MG TABS Take 400 mg by mouth daily.    Yes [provider]  Melatonin 3 MG TABS Take 3 mg by mouth at bedtime.   Yes [provider]  midodrine  (PROAMATINE) 5 MG tablet Take 1 tablet (5 mg total) by mouth 2 (two) times daily with a meal. Patient taking differently: Take 10 mg by mouth 2 (two) times daily with a meal.  06/20/18  Yes Elgergawy, Silver Huguenin, MD  Multiple Vitamins-Minerals (WOMENS MULTIVITAMIN PO) Take 1 tablet by mouth daily.   Yes [provider]  nystatin (NYSTATIN) powder Apply 1 g topically 2 (two)  times daily. Apply one application under bilateral breast twice daily until healed.   Yes [provider]  pantoprazole (PROTONIX) 40 MG tablet Take 1 tablet (40 mg total) by mouth 2 (two) times daily. 10/19/16  Yes Patrecia Pour, Christean Grief, MD  potassium chloride SA (KLOR-CON M20) 20 MEQ tablet Take 1 tablet (20 mEq total) by mouth daily. 06/20/18  Yes Elgergawy, Silver Huguenin, MD  Probiotic Product (RISA-BID PROBIOTIC PO) Take 1 tablet by mouth daily.   Yes [provider]  senna-docusate (SENOKOT-S) 8.6-50 MG tablet Take 1 tablet by mouth 2 (two) times daily. 10/19/16  Yes Patrecia Pour, Christean Grief, MD  simvastatin (ZOCOR) 40 MG tablet Take 20 mg by mouth daily.   Yes [provider]  SYNTHROID 100 MCG tablet Take 100 mcg by mouth daily before breakfast.  07/12/17  Yes [provider]  torsemide (DEMADEX) 20 MG tablet Take 2 tablets (40 mg total) by mouth daily. 06/20/18 07/20/18 Yes Elgergawy, Silver Huguenin, MD  traZODone (DESYREL) 50 MG tablet Take 100 mg by mouth at bedtime.    Yes [provider]  Turmeric 500 MG TABS Take 500 mg by mouth daily.    Yes [provider]  venlafaxine XR (EFFEXOR-XR) 75 MG 24 hr capsule Take 75 mg by mouth daily.   Yes [provider]    I have reviewed the patient's current medications.  Labs:  Results for orders placed or performed during the hospital encounter of 07/01/18 (from the past 48 hour(s))  Comprehensive metabolic panel     Status: Abnormal   Collection Time: 07/01/18 12:49 PM  Result Value Ref Range   Sodium 137 135 - 145 mmol/L    Potassium 5.1 3.5 - 5.1 mmol/L   Chloride 109 98 - 111 mmol/L   CO2 19 (L) 22 - 32 mmol/L   Glucose, Bld 80 70 - 99 mg/dL   BUN 61 (H) 8 - 23 mg/dL   Creatinine, Ser 1.87 (H) 0.44 - 1.00 mg/dL   Calcium 7.9 (L) 8.9 - 10.3 mg/dL   Total Protein 5.4 (L) 6.5 - 8.1 g/dL   Albumin 2.2 (L) 3.5 - 5.0 g/dL   AST 39 15 - 41 U/L   ALT 25 0 - 44 U/L   Alkaline Phosphatase 110 38 - 126 U/L   Total Bilirubin 0.5 0.3 - 1.2 mg/dL   GFR calc non Af Amer 24 (L) >60 mL/min   GFR calc Af Amer 27 (L) >60 mL/min   Anion gap 9 5 - 15    Comment: Performed at [facility], [address], [place], [place] [address]  CBC with Differential/Platelet     Status: Abnormal   Collection Time: 07/01/18 12:49 PM  Result Value Ref Range   WBC 7.2 4.0 - 10.5 K/uL   RBC 2.79 (L) 3.87 - 5.11 MIL/uL   Hemoglobin 8.4 (L) 12.0 - 15.0 g/dL   HCT 28.3 (L) 36.0 - 46.0 %   MCV 101.4 (H) 80.0 - 100.0 fL   MCH 30.1 26.0 - 34.0 pg   MCHC 29.7 (L) 30.0 - 36.0 g/dL   RDW 19.4 (H) 11.5 - 15.5 %   Platelets 245 150 - 400 K/uL   nRBC 0.0 0.0 - 0.2 %   Neutrophils Relative % 83 %   Neutro Abs 5.9 1.7 - 7.7 K/uL   Lymphocytes Relative 8 %   Lymphs Abs 0.6 (L) 0.7 - 4.0 K/uL   Monocytes Relative 6 %   Monocytes Absolute 0.5  0.1 - 1.0 K/uL   Eosinophils Relative 2 %   Eosinophils Absolute 0.1 0.0 - 0.5 K/uL   Basophils Relative 0 %   Basophils Absolute 0.0 0.0 - 0.1 K/uL   Immature Granulocytes 1 %   Abs Immature Granulocytes 0.05 0.00 - 0.07 K/uL    Comment: Performed at [facility] [address] [place], [place] [address]  Troponin I - ONCE - STAT     Status: None   Collection Time: 07/01/18 12:49 PM  Result Value Ref Range   Troponin I <0.03 <0.03 ng/mL    Comment: Performed at [facility], [place] [address], [place], [place] [address]  Urinalysis, Routine w reflex microscopic     Status: Abnormal   Collection Time: 07/01/18  2:32 PM  Result Value Ref Range   Color, Urine YELLOW YELLOW    APPearance CLEAR CLEAR   Specific Gravity, Urine 1.011 1.005 - 1.030   pH 5.0 5.0 - 8.0   Glucose, UA NEGATIVE NEGATIVE mg/dL   Hgb urine dipstick NEGATIVE NEGATIVE   Bilirubin Urine NEGATIVE NEGATIVE   Ketones, ur NEGATIVE NEGATIVE mg/dL   Protein, ur NEGATIVE NEGATIVE mg/dL   Nitrite NEGATIVE NEGATIVE   Leukocytes, UA TRACE (A) NEGATIVE   RBC / HPF 0-5 0 - 5 RBC/hpf   WBC, UA 11-20 0 - 5 WBC/hpf   Bacteria, UA RARE (A) NONE SEEN   Squamous Epithelial / LPF 0-5 0 - 5   Hyaline Casts, UA PRESENT     Comment: Performed at [facility], [address], [place], [place] [address]  I-Stat CG4 Lactic Acid, ED     Status: None   Collection Time: 07/01/18  3:33 PM  Result Value Ref Range   Lactic Acid, Venous 0.67 0.5 - 1.9 mmol/L  I-Stat CG4 Lactic Acid, ED     Status: None   Collection Time: 07/01/18  5:12 PM  Result Value Ref Range   Lactic Acid, Venous 0.54 0.5 - 1.9 mmol/L     ROS:  A comprehensive review of systems was negative except for: Cardiovascular: positive for lower extremity edema  Physical Exam: Vitals:   07/01/18 1745 07/01/18 1800  BP: 105/65 (!) 89/72  Pulse: 83   Resp: 12   Temp:    SpO2: 97%      General: pleasant WF- says she remembers me- NAD HEENT: PERRLA, EOMI, mucous membranes moist Neck: no JVD Heart: RRR Lungs: dec BS at bases Abdomen: soft, abdominal wall edema Extremities: pitting edema Skin: warm and dry Neuro: non focal   Assessment/Plan: 85 year old WF with multiple medical issues including CKD, hypoalbuminemia and hypotension with diastolic heart failure and volume overload  1.Renal- CKD- stage 3-4 - crt rises with appropriate diuresis- I would not let that hinder appropriate diuresis.  I would not consider her a candidate for dialysis given age , comorbids and hypotension would be fraught with difficulty 2. Hypertension/volume  - volume overloaded but also hypoalbuminemic so now third spacing- no great way to deal with that.  I  agree with IV diuretics - I will also increase midodrine.  She has no oxygen requirements  3. Anemia  - not helping with third spacing - iron was low last hosp- unclear if given iron at that time- will give iron and ESA  4. Dispo- agree with palliative care discussion- not sure how fixable this issue is- although also it does not seem to bother her much    Lambert Keto  A Vitalia Stough 07/01/2018, 6:24 PM

## 2018-07-01 NOTE — H&P (Signed)
History and Physical    Madison Riggs QMG:[id number] DOB: [birth year] DOA: 07/01/2018  PCP: Sadie Haber at [facility] Consultants:  Percival Spanish - cardiology; Moshe Cipro - nephrology; Elisha Ponder - podiatry; Irene Limbo - oncology Patient coming from: [facility] following recent hospitalization; NOK: Son, [phone]  Chief Complaint: Weakness and hypotension  HPI: Madison Riggs is a 85 y.o. female with medical history significant of OSA on BIPAP; dementia; diastolic CHF; h/o gastric bypass; hypothyroidism; HLD; HTN; DM; and stage 4 CKD presenting with LE edema and hypotension.  Recently discharged to rehab.  While there, she has gained 8-9 pounds.  The last 3 days, she has stabilized there at about 157.  She left here on 1/2 dose of torsemide but when they increase it, her BP worsens.  While there, she also developed shingles.  Baseline weight 136 prior to last admission.  She did have a paracentesis during last admission.  While in rehab, low sodium diet with fluid restriction.  She is having back pain from prior fall.  Her bottom is sore - ?pressure ulcers.  Continues to have mild SOB with exertion.  She was previously admitted from 12/26-1/6 for new-onset afib with RVR and acute on chronic diastolic CHF.  She had ascites and had paracentesis done with 6+L drained.  She was discharged to SNF.  ED Course:  ?Cardiorenal vs. Anasarca.  Weakness, mild hypotension.  Has improving shingles.  Edema, weight gain.  Renal function at baseline.  Echo 12/19 normal EF, here for afib.  Will need diuresis and hypotension improvement.  Cultures pending.  No antibiotics given.  Review of Systems: As per HPI; otherwise review of systems reviewed and negative.   Ambulatory Status:  Ambulated with walker prior to last hospitalization; less mobility now  Past Medical History:  Diagnosis Date  . Aortic stenosis    moderate  . CKD (chronic kidney disease) stage 3, GFR 30-59 ml/min (HCC)   . Depression   . DM  (diabetes mellitus), type 2 ([place])   . Gout   . HTN (hypertension)   . Hypercholesterolemia   . Hypothyroidism   . Iron deficiency anemia   . Sleep apnea    CPAP  . Syncope 06/10/2018    Past Surgical History:  Procedure Laterality Date  . BACK SURGERY  1984  . CATARACT EXTRACTION, BILATERAL  1997  . CHOLECYSTECTOMY  2001  . ESOPHAGOGASTRODUODENOSCOPY N/A 10/18/2016   Procedure: ESOPHAGOGASTRODUODENOSCOPY (EGD);  Surgeon: Teena Irani, MD;  Location: [facility];  Service: Endoscopy;  Laterality: N/A;  . GASTRIC BYPASS  1999  . IR PARACENTESIS  02/09/2017  . IR PARACENTESIS  06/17/2018  . KNEE SURGERY Left 1994  . THORACENTESIS Right    in [place], [place]  . TONSILLECTOMY      Social History   Socioeconomic History  . Marital status: Widowed    Spouse name: Not on file  . Number of children: Not on file  . Years of education: Not on file  . Highest education level: Not on file  Occupational History  . Not on file  Social Needs  . Financial resource strain: Not on file  . Food insecurity:    Worry: Not on file    Inability: Not on file  . Transportation needs:    Medical: Not on file    Non-medical: Not on file  Tobacco Use  . Smoking status: Never Smoker  . Smokeless tobacco: Never Used  Substance and Sexual Activity  . Alcohol use: No  . Drug use:  No  . Sexual activity: Not on file  Lifestyle  . Physical activity:    Days per week: Not on file    Minutes per session: Not on file  . Stress: Not on file  Relationships  . Social connections:    Talks on phone: Not on file    Gets together: Not on file    Attends religious service: Not on file    Active member of club or organization: Not on file    Attends meetings of clubs or organizations: Not on file    Relationship status: Not on file  . Intimate partner violence:    Fear of current or ex partner: Not on file    Emotionally abused: Not on file    Physically abused: Not on file    Forced sexual activity:  Not on file  Other Topics Concern  . Not on file  Social History Narrative   Widowed   Lives with son and daughter-in-law   [name] here from [place] 03/25/2016   2 sons   Occupation: retired, was a Museum/gallery conservator with [employer]    No Known Allergies  Family History  Problem Relation Age of Onset  . Breast cancer Mother   . Colon cancer Father   . Prostate cancer Father   . Emphysema Father     Prior to Admission medications   Medication Sig Start Date End Date Taking? Authorizing Provider  acetaminophen (TYLENOL) 325 MG tablet Take 2 tablets (650 mg total) by mouth every 6 (six) hours as needed for mild pain (or Fever >/= 101). 06/20/18  Yes Elgergawy, Silver Huguenin, MD  allopurinol (ZYLOPRIM) 100 MG tablet Take 100 mg by mouth 2 (two) times daily.     [provider]  amiodarone (PACERONE) 200 MG tablet Use 200 mg PO BID for next 5 days, then transition to 200 mg oral daily on 06/26/2018 06/20/18   Elgergawy, Silver Huguenin, MD  bisacodyl (DULCOLAX) 5 MG EC tablet Take 1 tablet (5 mg total) by mouth daily as needed for moderate constipation. 06/20/18   Elgergawy, Silver Huguenin, MD  calcium carbonate (OS-CAL) 600 MG TABS tablet Take 600 mg by mouth daily.    [provider]  cetirizine (ZYRTEC) 10 MG tablet Take 10 mg by mouth at bedtime.     [provider]  Cyanocobalamin (VITAMIN B-12) 5000 MCG SUBL Place 0.02 tablets (100 mcg total) under the tongue daily. 06/20/18   Elgergawy, Silver Huguenin, MD  feeding supplement, ENSURE ENLIVE, (ENSURE ENLIVE) LIQD Take 237 mLs by mouth 2 (two) times daily between meals. 06/21/18   Elgergawy, Silver Huguenin, MD  ferrous sulfate 325 (65 FE) MG tablet Take 1 tablet (325 mg total) by mouth daily with breakfast. 06/20/18   Elgergawy, Silver Huguenin, MD  fluticasone (FLONASE) 50 MCG/ACT nasal spray Place 1 spray into both nostrils daily.  11/18/16   [provider]  Lactobacillus (ACIDOPHILUS) 100 MG CAPS Take 100 mg by mouth daily.     [provider]  Magnesium 400 MG TABS Take 400 mg by mouth daily.     [provider]  midodrine (PROAMATINE) 5 MG tablet Take 1 tablet (5 mg total) by mouth 2 (two) times daily with a meal. 06/20/18   Elgergawy, Silver Huguenin, MD  Multiple Vitamins-Minerals (WOMENS MULTIVITAMIN PO) Take 1 tablet by mouth daily.    [provider]  pantoprazole (PROTONIX) 40 MG tablet Take 1 tablet (40 mg total) by mouth 2 (two) times daily.  Patient taking differently: Take 40 mg by mouth daily.  10/19/16   Doreatha Lew, MD  potassium chloride SA (KLOR-CON M20) 20 MEQ tablet Take 1 tablet (20 mEq total) by mouth daily. 06/20/18   Elgergawy, Silver Huguenin, MD  senna-docusate (SENOKOT-S) 8.6-50 MG tablet Take 1 tablet by mouth 2 (two) times daily. Patient taking differently: Take 1 tablet by mouth at bedtime.  10/19/16   Doreatha Lew, MD  simvastatin (ZOCOR) 40 MG tablet Take 20 mg by mouth daily.    [provider]  SYNTHROID 100 MCG tablet Take 100 mcg by mouth daily before breakfast.  07/12/17   [provider]  torsemide (DEMADEX) 20 MG tablet Take 2 tablets (40 mg total) by mouth daily. 06/20/18 07/20/18  Elgergawy, Silver Huguenin, MD  traZODone (DESYREL) 50 MG tablet Take 100 mg by mouth at bedtime.     [provider]  Turmeric 500 MG TABS Take 500 mg by mouth daily.     [provider]  venlafaxine XR (EFFEXOR-XR) 75 MG 24 hr capsule Take 75 mg by mouth daily.    [provider]    Physical Exam: Vitals:   07/01/18 1630 07/01/18 1645 07/01/18 1700 07/01/18 1715  BP: 98/61 95/69 (!) 86/63 92/65  Pulse: 83 (!) 116 84 82  Resp:  14 11 19   Temp:      TempSrc:      SpO2: 98% (!) 83% 99% 98%  Weight:      Height:         . General:  Appears calm and comfortable and is NAD . Eyes:  PERRL, EOMI, normal lids, iris . ENT:  Very hard of hearing, normal lips & tongue, mmm; appropriate dentition . Neck:  no LAD, masses or thyromegaly . Cardiovascular:   Irregularly irregular but rate controlled, no m/r/g. 2+ LE edema despite compression stockings . Respiratory:   CTA bilaterally with no wheezes/rales/rhonchi.  Normal respiratory effort. . Abdomen: NT, ND, NABS; + abdominal wall edema plus ascites with fluid wave . Musculoskeletal:  grossly normal tone BUE/BLE, good ROM, no bony abnormality . Psychiatric:  grossly normal mood and affect, speech fluent and appropriate, AOx3 . Neurologic:  CN 2-12 grossly intact, moves all extremities in coordinated fashion, sensation intact    Radiological Exams on Admission: Dg Chest Port 1 View  Result Date: 07/01/2018 CLINICAL DATA:  Rales. EXAM: PORTABLE CHEST 1 VIEW COMPARISON:  Radiographs of June 09, 2018. FINDINGS: Stable cardiomegaly with central pulmonary vascular congestion. No pneumothorax is noted. Possible minimal bibasilar edema is noted. Minimal right pleural effusion is noted. Bony thorax is unremarkable. IMPRESSION: Stable cardiomegaly with central pulmonary vascular congestion and possible minimal bibasilar edema. Stable minimal right pleural effusion. Electronically Signed   By: Marijo Conception, M.D.   On: 07/01/2018 13:19    EKG: Independently reviewed.  Afib with rate 85; nonspecific ST changes with no evidence of acute ischemia   Labs on Admission: I have personally reviewed the available labs and imaging studies at the time of the admission.  Pertinent labs:   CO2 19 BUN 61/Creatinine 1.87/GFR 24; 54/1.52/30 on 06/20/18 Albumin 2.2 Troponin <0.03 WBC 7.2 Hgb 8.4; 8.9 on 1/5 UA: trace LE, rare bacteria  Lactate 0.67, 0.54  Assessment/Plan Principal Problem:   Anasarca Active Problems:   Iron deficiency anemia   Hypertension   Type II diabetes mellitus with renal manifestations (HCC)   Chronic diastolic HF (heart failure) (HCC)   CKD (chronic kidney disease), stage IV (  [place])   Atrial fibrillation, chronic   HLD (hyperlipidemia)   Hypothyroidism   Anasarca -Patient  with persistent 20-pound weight gain that appears to be associated with volume overload -This appears to be related to hypoalbuminemia with marked third spacing; will request nutrition consult for assistance from a dietary standpoint -This is challenging because the patient has a borderline hypotension, limiting the ability to diurese her -She does not appear to be decompensated from a CHF standpoint at this time -She does have ascites and benefited from paracentesis last hospitalization; given current marked abdominal wall edema but also apparent fluid wave, will again consult IR for drainage -Will add an abdominal binder as well as continuing her compression stockings -She is on midodrine for hypotension -Will change from once daily torsemide to more frequent diuretic dosing - for now with Lasix 40 mg IV q12h -Will monitor on telemetry for now given relative hypotension -After discussion with patient/son, will request palliative care consult regarding goals of care since this appears to be a difficult issue to manage and may negatively impact her long-term morbidity and mortality -Will request PT/OT consults as this is also impacting her mobility   Stage IV CKD -Appears to be relatively stable -she would be a poor dialysis candidate, if needed -Nephrology consult requested due to the complexity of this patient's issues; discussed with Dr. Moshe Cipro and they will see the patient in the AM -Nephrology prn order set used  Chronic afib -Rate controlled with Amiodarone given her borderline hypotension (no CCB or BB) -She is a poor AC candidate for a number of reasons (immobility, falls, chronic GI bleed, etc)  DM -Diet controlled  Chronic diastolic CHF -As noted above, appears to be compensated at this time despite anasarca -Grade 2 diastolic dysfunction in 5625, unable to assess due to afib on recent echo but she had a preserved EF at that time  Anemia -She required 2 units PRBC during  prior hospitalization -Relatively stable Hgb at this time -Recheck in AM -Given CKD, she was recommended for consideration of Procrit and IV iron as an outpatient during last admission  HLD -Continue Zocor  Hypothyroidism -Recent normal TSH -Continue Synthroid at current dose for now  Other -The patient is currently in SNF rehab -Her expected d/c date was 1/20 and her son is trying to appeal this -He is also trying to meet with a Medicare attorney to work on getting her admitted long-term to a facility -He appears to need a competency letter to assist with this issue  DVT prophylaxis:  SCDs Code Status:  Full - confirmed with patient/family Family Communication: Son present throughout evaluation Disposition Plan:  SNF once clinically improved Consults called: Nephrology; IR; Palliative; PT/OT/SW Admission status: Admit - It is my clinical opinion that admission to INPATIENT is reasonable and necessary because of the expectation that this patient will require hospital care that crosses at least 2 midnights to treat this condition based on the medical complexity of the problems presented.  Given the aforementioned information, the predictability of an adverse outcome is felt to be significant.    Karmen Bongo MD [facility]  If note is complete, please contact covering daytime or nighttime physician. [email or web address]   07/01/2018, 6:09 PM

## 2018-07-02 ENCOUNTER — Encounter (HOSPITAL_COMMUNITY): Payer: Self-pay | Admitting: Primary Care

## 2018-07-02 DIAGNOSIS — I5032 Chronic diastolic (congestive) heart failure: Secondary | ICD-10-CM

## 2018-07-02 DIAGNOSIS — L8990 Pressure ulcer of unspecified site, unspecified stage: Secondary | ICD-10-CM

## 2018-07-02 DIAGNOSIS — Z515 Encounter for palliative care: Secondary | ICD-10-CM

## 2018-07-02 DIAGNOSIS — Z7189 Other specified counseling: Secondary | ICD-10-CM

## 2018-07-02 DIAGNOSIS — E440 Moderate protein-calorie malnutrition: Secondary | ICD-10-CM

## 2018-07-02 LAB — GLUCOSE, CAPILLARY
GLUCOSE-CAPILLARY: 86 mg/dL (ref 70–99)
Glucose-Capillary: 144 mg/dL — ABNORMAL HIGH (ref 70–99)
Glucose-Capillary: 92 mg/dL (ref 70–99)

## 2018-07-02 LAB — CBC
HCT: 25.1 % — ABNORMAL LOW (ref 36.0–46.0)
Hemoglobin: 7.5 g/dL — ABNORMAL LOW (ref 12.0–15.0)
MCH: 29.3 pg (ref 26.0–34.0)
MCHC: 29.9 g/dL — ABNORMAL LOW (ref 30.0–36.0)
MCV: 98 fL (ref 80.0–100.0)
NRBC: 0.3 % — AB (ref 0.0–0.2)
PLATELETS: 208 10*3/uL (ref 150–400)
RBC: 2.56 MIL/uL — ABNORMAL LOW (ref 3.87–5.11)
RDW: 19.4 % — ABNORMAL HIGH (ref 11.5–15.5)
WBC: 6.6 10*3/uL (ref 4.0–10.5)

## 2018-07-02 LAB — BASIC METABOLIC PANEL
Anion gap: 9 (ref 5–15)
BUN: 59 mg/dL — ABNORMAL HIGH (ref 8–23)
CO2: 21 mmol/L — ABNORMAL LOW (ref 22–32)
Calcium: 7.9 mg/dL — ABNORMAL LOW (ref 8.9–10.3)
Chloride: 109 mmol/L (ref 98–111)
Creatinine, Ser: 2 mg/dL — ABNORMAL HIGH (ref 0.44–1.00)
GFR calc Af Amer: 25 mL/min — ABNORMAL LOW (ref >60)
GFR calc non Af Amer: 22 mL/min — ABNORMAL LOW (ref >60)
Glucose, Bld: 70 mg/dL (ref 70–99)
Potassium: 4.3 mmol/L (ref 3.5–5.1)
Sodium: 139 mmol/L (ref 135–145)

## 2018-07-02 MED ORDER — INSULIN ASPART 100 UNIT/ML ~~LOC~~ SOLN
0.0000 [IU] | Freq: Three times a day (TID) | SUBCUTANEOUS | Status: DC
  Administered 2018-07-02 – 2018-07-04 (×2): 1 [IU] via SUBCUTANEOUS
  Administered 2018-07-05: 2 [IU] via SUBCUTANEOUS
  Administered 2018-07-05: 1 [IU] via SUBCUTANEOUS

## 2018-07-02 MED ORDER — GABAPENTIN 100 MG PO CAPS
100.0000 mg | ORAL_CAPSULE | Freq: Every day | ORAL | Status: DC
  Administered 2018-07-03 – 2018-07-06 (×4): 100 mg via ORAL
  Filled 2018-07-02 (×4): qty 1

## 2018-07-02 NOTE — Progress Notes (Addendum)
Paged Jeannette Corpus notification BP 83/55, holding Lasix.  2315 paged provider; no new orders given.

## 2018-07-02 NOTE — Social Work (Signed)
This CSW familiar with patient from last admission; full assessment completed at last admission 05/30/18 - 06/20/18. Patient was at [facility] for rehab. Noted palliative consult pending. CSW spoke to patient's son, [name], on the phone. [name] confirmed awaiting call from palliative to make appointment to discuss goals of care. Will follow for palliative recommendations and support with disposition planning as appropriate.  Estanislado Emms, LCSW [phone]

## 2018-07-02 NOTE — Evaluation (Signed)
Physical Therapy Evaluation Patient Details Name: Madison Riggs MRN: [id number] DOB: 09-18-1927 Today's Date: 07/02/2018   History of Present Illness  85 y.o. female with medical history significant of OSA on BIPAP; dementia; diastolic CHF; h/o gastric bypass; hypothyroidism; HLD; HTN; DM; and stage 4 CKD presenting with LE edema and hypotension.  Recently discharged to rehab.   Clinical Impression  PTA pt residing at [facility] in rehab for prior hospitalization. Pt reports ambulation with Rollator and requiring assist for lower body bathing and dressing. Pt currently is limited in safe mobility by decreased BP and decreased strength and endurance. Pt requires minA for coming to and from [place] and is able to scoot along the EoB without assist. PT recommends return to [facility] at d/c to resume rehab prior to ultimately going home. PT will continue to follow acutely.     Follow Up Recommendations SNF    Equipment Recommendations  None recommended by PT    Recommendations for Other Services       Precautions / Restrictions Precautions Precautions: Fall Precaution Comments: watch BP Required Braces or Orthoses: Other Brace Other Brace: Binder Restrictions Weight Bearing Restrictions: No      Mobility  Bed Mobility Overal bed mobility: Needs Assistance Bed Mobility: Supine to Sit;Sit to Supine     Supine to sit: Min assist Sit to supine: Min assist   General bed mobility comments: min A for bringing trunk to upright to come into seated at EoB, pt able to scoot along EoB toward HoB and required minA for management of LE back into bed        Balance Overall balance assessment: Needs assistance Sitting-balance support: No upper extremity supported;Feet supported Sitting balance-Leahy Scale: Fair                                       Pertinent Vitals/Pain Pain Assessment: 0-10 Faces Pain Scale: Hurts little more Pain Location: sacrum, foam dressing  in place Pain Descriptors / Indicators: Aching;Spasm Pain Intervention(s): Limited activity within patient's tolerance;Monitored during session;Repositioned    Home Living Family/patient expects to be discharged to:: Skilled nursing facility(came from [place])                      Prior Function Level of Independence: Needs assistance   Gait / Transfers Assistance Needed: used Radiation protection practitioner at [employer]  ADL's / [employer] Needed: requires assist with lowerbody dressing           Extremity/Trunk Assessment   Upper Extremity Assessment Upper Extremity Assessment: Defer to OT evaluation    Lower Extremity Assessment Lower Extremity Assessment: Generalized weakness(ROM WFL, and strength grossly assessed at 4-/5)    Cervical / Trunk Assessment Cervical / Trunk Assessment: Kyphotic  Communication   Communication: No difficulties  Cognition Arousal/Alertness: Awake/alert Behavior During Therapy: WFL for tasks assessed/performed Overall Cognitive Status: Within Functional Limits for tasks assessed                                        General Comments General comments (skin integrity, edema, etc.): In supine BP 84/57, HR 87, in sitting BP 69/45, HR 80. Pt asymptomatic, however pt returned to supine as physician entered room for consult.         Assessment/Plan    PT Assessment Patient  needs continued PT services  PT Problem List Decreased strength;Decreased balance;Pain;Cardiopulmonary status limiting activity;Decreased mobility;Decreased activity tolerance       PT Treatment Interventions Functional mobility training;Balance training;Gait training;Patient/family education;Therapeutic activities;Therapeutic exercise    PT Goals (Current goals can be found in the Care Plan section)  Acute Rehab PT Goals Patient Stated Goal: get to recliner PT Goal Formulation: With patient Time For Goal Achievement: 07/16/18 Potential to Achieve  Goals: Good    Frequency Min 2X/week   Barriers to discharge        Co-evaluation PT/OT/SLP Co-Evaluation/Treatment: Yes Reason for Co-Treatment: Complexity of the patient's impairments (multi-system involvement) PT goals addressed during session: Mobility/safety with mobility;Balance         AM-PAC PT "6 Clicks" Mobility  Outcome Measure Help needed turning from your back to your side while in a flat bed without using bedrails?: A Little Help needed moving from lying on your back to sitting on the side of a flat bed without using bedrails?: A Little Help needed moving to and from a bed to a chair (including a wheelchair)?: A Little Help needed standing up from a chair using your arms (e.g., wheelchair or bedside chair)?: A Little Help needed to walk in hospital room?: A Little Help needed climbing 3-5 steps with a railing? : A Lot 6 Click Score: 17    End of Session   Activity Tolerance: Patient tolerated treatment well;Treatment limited secondary to medical complications (Comment) Patient left: in bed;with call bell/phone within reach;with bed alarm set;with nursing/sitter in room;with SCD's reapplied Nurse Communication: Mobility status PT Visit Diagnosis: Difficulty in walking, not elsewhere classified (R26.2)    Time: 6160-7371 PT Time Calculation (min) (ACUTE ONLY): 16 min   Charges:   PT Evaluation $PT Eval Moderate Complexity: 1 Mod          Zoriana Oats B. Migdalia Dk PT, DPT Acute Rehabilitation Services Pager [phone] Office [phone]   [place] 07/02/2018, 10:25 AM

## 2018-07-02 NOTE — Progress Notes (Signed)
Subjective:  Had 1450 of urine but 1100 in- held lasix this AM because BP is low in the 80's- she is completely asymptomatic - wanted to get up but BP supposedly dropped to the 60's-  There is not a manual cuff anywhere to be found on this unit !!   Objective Vital signs in last 24 hours: Vitals:   07/01/18 1857 07/01/18 2128 07/02/18 0604 07/02/18 0954  BP: 102/64 (!) 83/55 (!) 85/57 (!) 84/57  Pulse: 83 82 79 86  Resp: 15 16 17    Temp: 97.6 F (36.4 C) 97.6 F (36.4 C) 97.7 F (36.5 C)   TempSrc: Oral Oral Oral   SpO2: 99% 99% 97%   Weight:      Height:       Weight change:   Intake/Output Summary (Last 24 hours) at 07/02/2018 0955 Last data filed at 07/02/2018 0604 Gross per 24 hour  Intake 1100 ml  Output 1450 ml  Net -350 ml     Assessment/Plan: 85 year old WF with multiple medical issues including CKD, hypoalbuminemia and hypotension with diastolic heart failure and volume overload  1.Renal- CKD- stage 3-4 - crt rises with appropriate diuresis- I would not let that hinder appropriate diuresis.  I would not consider her a candidate for dialysis given age , comorbids and hypotension would be fraught with difficulty- ongoing low BPs will also make trouble for her CKD- a cardiorenal type picture 2. Hypertension/volume  - volume overloaded but also hypoalbuminemic so now third spacing- no great way to deal with that.  I agree with IV diuretics - I have also increased her midodrine to max dosing.  She has no oxygen requirements - will dec neurontin to at night only - also not sure if BP is completely accurate- cannot check manually ??  Would not hold lasix as I do not think will drop her pressure  3. Anemia  - not helping with third spacing - iron was low last hosp- unclear if given iron at that time- giving iron and ESA- consider transfusion ?    4. Dispo- agree with palliative care discussion- not sure how fixable this issue is- although also it does not seem to bother her much       Louis Meckel    Labs: Basic Metabolic Panel: Recent Labs  Lab 07/01/18 1249 07/02/18 0250  NA 137 139  K 5.1 4.3  CL 109 109  CO2 19* 21*  GLUCOSE 80 70  BUN 61* 59*  CREATININE 1.87* 2.00*  CALCIUM 7.9* 7.9*   Liver Function Tests: Recent Labs  Lab 07/01/18 1249  AST 39  ALT 25  ALKPHOS 110  BILITOT 0.5  PROT 5.4*  ALBUMIN 2.2*   No results for input(s): LIPASE, AMYLASE in the last 168 hours. No results for input(s): AMMONIA in the last 168 hours. CBC: Recent Labs  Lab 07/01/18 1249 07/02/18 0250  WBC 7.2 6.6  NEUTROABS 5.9  --   HGB 8.4* 7.5*  HCT 28.3* 25.1*  MCV 101.4* 98.0  PLT 245 208   Cardiac Enzymes: Recent Labs  Lab 07/01/18 1249  TROPONINI <0.03   CBG: No results for input(s): GLUCAP in the last 168 hours.  Iron Studies: No results for input(s): IRON, TIBC, TRANSFERRIN, FERRITIN in the last 72 hours. Studies/Results: Dg Chest Port 1 View  Result Date: 07/01/2018 CLINICAL DATA:  Rales. EXAM: PORTABLE CHEST 1 VIEW COMPARISON:  Radiographs of June 09, 2018. FINDINGS: Stable cardiomegaly with central pulmonary vascular congestion. No  pneumothorax is noted. Possible minimal bibasilar edema is noted. Minimal right pleural effusion is noted. Bony thorax is unremarkable. IMPRESSION: Stable cardiomegaly with central pulmonary vascular congestion and possible minimal bibasilar edema. Stable minimal right pleural effusion. Electronically Signed   By: Marijo Conception, M.D.   On: 07/01/2018 13:19   Medications: Infusions: . ferric gluconate (FERRLECIT/NULECIT) IV 125 mg (07/01/18 2032)    Scheduled Medications: . allopurinol  100 mg Oral BID  . amiodarone  200 mg Oral Daily  . darbepoetin (ARANESP) injection - NON-DIALYSIS  200 mcg Subcutaneous Q Sat-1800  . fluticasone  1 spray Each Nare Daily  . furosemide  40 mg Intravenous Q12H  . gabapentin  100 mg Oral TID  . levothyroxine  100 mcg Oral QAC breakfast  . loratadine  10  mg Oral Daily  . Melatonin  3 mg Oral QHS  . midodrine  10 mg Oral TID WC  . pantoprazole  40 mg Oral BID  . senna-docusate  1 tablet Oral BID  . simvastatin  20 mg Oral Daily  . traZODone  100 mg Oral QHS  . venlafaxine XR  75 mg Oral Daily    have reviewed scheduled and prn medications.  Physical Exam: General: pleasant, NAD at all  Heart: RRR Lungs: mostly clear Abdomen: soft , non tender Extremities: pitting edema    07/02/2018,9:55 AM  LOS: 1 day

## 2018-07-02 NOTE — Consult Note (Addendum)
Consultation Note Date: 07/02/2018   Patient Name: Madison Riggs  DOB: [birth year]  MRN: [id number]  Age / Sex: 85 y.o., female  PCP: Rolene Course, PA-C Referring Physician: Dessa Phi, DO  Reason for Consultation: Establishing goals of care and Psychosocial/spiritual support  HPI/Patient Profile: 85 y.o. female  with past medical history of stage III chronic kidney disease, CHF, she is also noted to have "mild" dementia, diabetes, aortic stenosis, high blood pressure and cholesterol, sleep apnea, hypothyroidism, gout, depression, iron deficiency anemia,  admitted on 07/01/2018 with anasarca related to volume overload and low albumin.   Clinical Assessment and Goals of Care: Madison Riggs is resting quietly in bed.  She wakes easily making and keeping eye contact.  She is oriented to person, place, and time and although she is noted to have mild dementia, and does not seem so at this time.  There is no family at bedside at this time.  We talk about healthcare power of attorney, see below.  She has no questions or concerns at this time stating that she feels okay.  She tells me that [facility] [place].  Call to son [name].  We talked about Madison Riggs "mild dementia".  [name] tells me that a [place] healthcare nurse came for yearly exam and Madison Riggs was unable to complete the clock test.  He tells me that no doctor has ever mentioned dementia.  [name] shares that Madison Riggs has 2 hearing aids, and does not hear well.  He shares that she will sometimes answer inappropriately because she does not hear, or nod when she does not understand questions.   We review nephrology consult, and I share that at this point Madison Riggs is not a candidate for hemodialysis.  [name] shares that his father had hemodialysis for 11 years so the family is familiar with hemodialysis.  We talked about  cardiorenal syndrome and fluid overload.  We also discuss Madison Riggs low albumin.  [name] shares that she had a gastric bypass over 20 years ago and this may have affected her ability to absorb proteins, as he shares she is eating well.   We talked about healthcare power of attorney, see below.  We also talked about advance directives, see below.  I discuss the benefits of outpatient palliative medicine if [facility] returns [facility] for [facility].  We also discussed the benefits of in-home hospice.    We talked about 24 to 48 hours for outcomes.  PMT has scheduled a meeting for Monday afternoon around [place] who is out of town will conference in.  HCPOA   HCPOA - Madison Riggs names her son [name] as her HCPOA.    SUMMARY OF RECOMMENDATIONS   At this point continue full scope full code Family meeting 1/20 at 2 PM at bedside.  Code Status/Advance Care Planning:  Full code-son [name] states that his mother has said that she did not want to be kept alive on a machine, but she does want CPR  and intubation for limited time.  Symptom Management:   Per hospitalist, no additional needs at this time.   Palliative Prophylaxis:   Frequent Pain Assessment and Turn Reposition  Additional Recommendations (Limitations, Scope, Preferences):  Full Scope Treatment  Psycho-social/Spiritual:   Desire for further Chaplaincy support:no  Additional Recommendations: Caregiving  Support/Resources and Education on Hospice  Prognosis:   < 6 months or less would not bu surprising based on 2 admits and 1 ED visit in 6 months, albumin of 2.2, frailty, worsening kidney fx.   Discharge Planning: anticipate return to rehab vs home with hospice.       Primary Diagnoses: Present on Admission: . Anasarca . Chronic diastolic HF (heart failure) ([place]) . CKD (chronic kidney disease), stage IV ([place]) . HLD (hyperlipidemia) . Hypertension . Hypothyroidism . Iron deficiency anemia . Type II  diabetes mellitus with renal manifestations ([place]) . Atrial fibrillation, chronic   I have reviewed the medical record, interviewed the patient and family, and examined the patient. The following aspects are pertinent.  Past Medical History:  Diagnosis Date  . Aortic stenosis    moderate  . CKD (chronic kidney disease) stage 3, GFR 30-59 ml/min (HCC)   . Depression   . DM (diabetes mellitus), type 2 ([place])   . Gout   . HTN (hypertension)   . Hypercholesterolemia   . Hypothyroidism   . Iron deficiency anemia   . Sleep apnea    CPAP  . Syncope 06/10/2018   Social History   Socioeconomic History  . Marital status: Widowed    Spouse name: Not on file  . Number of children: Not on file  . Years of education: Not on file  . Highest education level: Not on file  Occupational History  . Not on file  Social Needs  . Financial resource strain: Not on file  . Food insecurity:    Worry: Not on file    Inability: Not on file  . Transportation needs:    Medical: Not on file    Non-medical: Not on file  Tobacco Use  . Smoking status: Never Smoker  . Smokeless tobacco: Never Used  Substance and Sexual Activity  . Alcohol use: No  . Drug use: No  . Sexual activity: Not on file  Lifestyle  . Physical activity:    Days per week: Not on file    Minutes per session: Not on file  . Stress: Not on file  Relationships  . Social connections:    Talks on phone: Not on file    Gets together: Not on file    Attends religious service: Not on file    Active member of club or organization: Not on file    Attends meetings of clubs or organizations: Not on file    Relationship status: Not on file  Other Topics Concern  . Not on file  Social History Narrative   Widowed   Lives with son and daughter-in-law   [name] here from [place] 03/25/2016   2 sons   Occupation: retired, was a Museum/gallery conservator with [employer]   Family History  Problem Relation Age of Onset  . Breast cancer  Mother   . Colon cancer Father   . Prostate cancer Father   . Emphysema Father    Scheduled Meds: . allopurinol  100 mg Oral BID  . amiodarone  200 mg Oral Daily  . darbepoetin (ARANESP) injection - NON-DIALYSIS  200 mcg Subcutaneous Q Sat-1800  . fluticasone  1  spray Each Nare Daily  . furosemide  40 mg Intravenous Q12H  . [START ON 07/03/2018] gabapentin  100 mg Oral QHS  . insulin aspart  0-9 Units Subcutaneous TID WC  . levothyroxine  100 mcg Oral QAC breakfast  . loratadine  10 mg Oral Daily  . Melatonin  3 mg Oral QHS  . midodrine  10 mg Oral TID WC  . pantoprazole  40 mg Oral BID  . senna-docusate  1 tablet Oral BID  . simvastatin  20 mg Oral Daily  . traZODone  100 mg Oral QHS  . venlafaxine XR  75 mg Oral Daily   Continuous Infusions: . ferric gluconate (FERRLECIT/NULECIT) IV 125 mg (07/02/18 0956)   PRN Meds:.acetaminophen **OR** acetaminophen, bisacodyl, calcium carbonate (dosed in mg elemental calcium), camphor-menthol **AND** hydrOXYzine, docusate sodium, feeding supplement (NEPRO CARB STEADY), ondansetron **OR** ondansetron (ZOFRAN) IV, sorbitol, zolpidem Medications Prior to Admission:  Prior to Admission medications   Medication Sig Start Date End Date Taking? Authorizing Provider  acetaminophen (TYLENOL) 325 MG tablet Take 2 tablets (650 mg total) by mouth every 6 (six) hours as needed for mild pain (or Fever >/= 101). 06/20/18  Yes Elgergawy, Silver Huguenin, MD  allopurinol (ZYLOPRIM) 100 MG tablet Take 100 mg by mouth 2 (two) times daily.    Yes [provider]  amiodarone (PACERONE) 200 MG tablet Use 200 mg PO BID for next 5 days, then transition to 200 mg oral daily on 06/26/2018 06/20/18  Yes Elgergawy, Silver Huguenin, MD  bisacodyl (DULCOLAX) 5 MG EC tablet Take 1 tablet (5 mg total) by mouth daily as needed for moderate constipation. 06/20/18  Yes Elgergawy, Silver Huguenin, MD  calcium carbonate (OS-CAL) 600 MG TABS tablet Take 600 mg by mouth daily.   Yes [provider]  cetirizine (ZYRTEC) 10 MG tablet Take 10 mg by mouth at bedtime.    Yes [provider]  Cyanocobalamin (VITAMIN B-12) 5000 MCG SUBL Place 0.02 tablets (100 mcg total) under the tongue daily. 06/20/18  Yes Elgergawy, Silver Huguenin, MD  diphenhydrAMINE (BENADRYL) 25 MG tablet Take 12.5 mg by mouth every 8 (eight) hours as needed for itching.   Yes [provider]  ferrous sulfate 325 (65 FE) MG tablet Take 1 tablet (325 mg total) by mouth daily with breakfast. 06/20/18  Yes Elgergawy, Silver Huguenin, MD  fluticasone (FLONASE) 50 MCG/ACT nasal spray Place 1 spray into both nostrils daily.  11/18/16  Yes [provider]  gabapentin (NEURONTIN) 100 MG capsule Take 100 mg by mouth 3 (three) times daily.   Yes [provider]  Magnesium 400 MG TABS Take 400 mg by mouth daily.    Yes [provider]  Melatonin 3 MG TABS Take 3 mg by mouth at bedtime.   Yes [provider]  midodrine (PROAMATINE) 5 MG tablet Take 1 tablet (5 mg total) by mouth 2 (two) times daily with a meal. Patient taking differently: Take 10 mg by mouth 2 (two) times daily with a meal.  06/20/18  Yes Elgergawy, Silver Huguenin, MD  Multiple Vitamins-Minerals (WOMENS MULTIVITAMIN PO) Take 1 tablet by mouth daily.   Yes [provider]  nystatin (NYSTATIN) powder Apply 1 g topically 2 (two) times daily. Apply one application under bilateral breast twice daily until healed.   Yes [provider]  pantoprazole (PROTONIX) 40 MG tablet Take 1 tablet (40 mg total) by mouth 2 (two) times daily. 10/19/16  Yes Doreatha Lew, MD  potassium chloride SA (  KLOR-CON M20) 20 MEQ tablet Take 1 tablet (20 mEq total) by mouth daily. 06/20/18  Yes Elgergawy, Silver Huguenin, MD  Probiotic Product (RISA-BID PROBIOTIC PO) Take 1 tablet by mouth daily.   Yes [provider]  senna-docusate (SENOKOT-S) 8.6-50 MG tablet Take 1 tablet by mouth 2 (two) times daily. 10/19/16  Yes Patrecia Pour, Christean Grief, MD    simvastatin (ZOCOR) 40 MG tablet Take 20 mg by mouth daily.   Yes [provider]  SYNTHROID 100 MCG tablet Take 100 mcg by mouth daily before breakfast.  07/12/17  Yes [provider]  torsemide (DEMADEX) 20 MG tablet Take 2 tablets (40 mg total) by mouth daily. 06/20/18 07/20/18 Yes Elgergawy, Silver Huguenin, MD  traZODone (DESYREL) 50 MG tablet Take 100 mg by mouth at bedtime.    Yes [provider]  Turmeric 500 MG TABS Take 500 mg by mouth daily.    Yes [provider]  venlafaxine XR (EFFEXOR-XR) 75 MG 24 hr capsule Take 75 mg by mouth daily.   Yes [provider]   No Known Allergies Review of Systems  Unable to perform ROS: Dementia    Physical Exam Vitals signs and nursing note reviewed.  Constitutional:      Comments: Makes and keeps eye contact, oriented to person, place and time.   Cardiovascular:     Rate and Rhythm: Normal rate.  Abdominal:     General: There is distension.     Tenderness: There is no guarding.  Musculoskeletal:        General: Swelling present.  Skin:    General: Skin is warm and dry.  Neurological:     Mental Status: She is alert and oriented to person, place, and time.     Vital Signs: BP (!) 80/63 (BP Location: Right Arm)   Pulse 85   Temp 97.8 F (36.6 C) (Oral)   Resp 16   Ht 5\' 2"  (1.575 m)   Wt 70.1 kg   SpO2 98%   BMI 28.27 kg/m  Pain Scale: 0-10   Pain Score: 0-No pain   SpO2: SpO2: 98 % O2 Device:SpO2: 98 % O2 Flow Rate: .   IO: Intake/output summary:   Intake/Output Summary (Last 24 hours) at 07/02/2018 1605 Last data filed at 07/02/2018 1408 Gross per 24 hour  Intake 400 ml  Output 1450 ml  Net -1050 ml    LBM: Last BM Date: 06/30/18 Baseline Weight: Weight: 70.1 kg Most recent weight: Weight: 70.1 kg     Palliative Assessment/Data:   Flowsheet Rows     Most Recent Value  Intake Tab  Referral Department  Hospitalist  Unit at Time of Referral  Cardiac/Telemetry Unit   Palliative Care Primary Diagnosis  Other (Comment)  Date Notified  07/01/18  Palliative Care Type  New Palliative care  Reason for referral  Clarify Goals of Care  Date of Admission  07/01/18  Date first seen by Palliative Care  07/02/18  # of days Palliative referral response time  1 Day(s)  # of days IP prior to Palliative referral  0  Clinical Assessment  Palliative Performance Scale Score  40%  Pain Max last 24 hours  Not able to report  Pain Min Last 24 hours  Not able to report  Dyspnea Max Last 24 Hours  Not able to report  Dyspnea Min Last 24 hours  Not able to report  Psychosocial & Spiritual Assessment  Palliative Care Outcomes      Time In:  1500 Time Out: 1620 Time Total: 70 minutes Greater than 50%  of this time was spent counseling and coordinating care related to the above assessment and plan.  Signed by: Drue Novel, NP   Please contact Palliative Medicine Team phone at [phone] for questions and concerns.  For individual provider: See Shea Evans

## 2018-07-02 NOTE — Progress Notes (Signed)
Bp low, see FS. Lasix to be given  Per Dr Moshe Cipro and Dr Maylene Roes.

## 2018-07-02 NOTE — Evaluation (Signed)
Occupational Therapy Evaluation Patient Details Name: Madison Riggs MRN: [id number] DOB: September 27, 1927 Today's Date: 07/02/2018    History of Present Illness 85 y.o. female with medical history significant of OSA on BIPAP; dementia; diastolic CHF; h/o gastric bypass; hypothyroidism; HLD; HTN; DM; and stage 4 CKD presenting with LE edema and hypotension.  Recently discharged to rehab.    Clinical Impression   Pt admitted for above. Pt getting assist with ADLs, PTA. Feel pt will benefit from acute OT to increase independence prior to d/c. Recommending SNF. Low BP in session-see vital section below.  Follow Up Recommendations  SNF    Equipment Recommendations  Other (comment)(defer to next venue)    Recommendations for Other Services       Precautions / Restrictions Precautions Precautions: Fall Precaution Comments: watch BP Required Braces or Orthoses: Other Brace Other Brace: Binder Restrictions Weight Bearing Restrictions: No      Mobility Bed Mobility Overal bed mobility: Needs Assistance Bed Mobility: Supine to Sit;Sit to Supine     Supine to sit: Min assist Sit to supine: Min assist   General bed mobility comments: min A for bringing trunk to upright to come into seated at EOB, pt able to scoot along EoB toward HoB and required minA for management of LE back into bed   Transfers                 General transfer comment: not assessed-drop in BP    Balance Overall balance assessment: Needs assistance Sitting-balance support: No upper extremity supported;Feet supported Sitting balance-Leahy Scale: Fair                                     ADL either performed or assessed with clinical judgement   ADL Overall ADL's : Needs assistance/impaired     Grooming: Wash/dry face;Bed level;Supervision/safety;Set up               Lower Body Dressing: Maximal assistance;Sitting/lateral leans;Bed level                 General ADL  Comments: Sat on EOB and returned to supine as pt's BP dropped.     Vision Baseline Vision/History: Wears glasses       Perception     Praxis      Pertinent Vitals/Pain Pain Assessment: 0-10 Faces Pain Scale: Hurts little more Pain Location: sacrum, foam dressing in place Pain Descriptors / Indicators: Other (Comment)("hurt") Pain Intervention(s): Monitored during session     Hand Dominance     Extremity/Trunk Assessment Upper Extremity Assessment Upper Extremity Assessment: Generalized weakness   Lower Extremity Assessment Lower Extremity Assessment: Defer to PT evaluation   Cervical / Trunk Assessment Cervical / Trunk Assessment: Kyphotic   Communication Communication Communication: HOH   Cognition Arousal/Alertness: Awake/alert Behavior During Therapy: WFL for tasks assessed/performed Overall Cognitive Status: Within Functional Limits for tasks assessed                                     General Comments  In supine BP 84/57, HR 87, in sitting BP 69/45, HR 80. Pt asymptomatic, however pt returned to supine as physician entered room for consult.     Exercises     Shoulder Instructions      Home Living Family/patient expects to be discharged to:: Skilled nursing facility(came from [facility])  Prior Functioning/Environment Level of Independence: Needs assistance  Gait / Transfers Assistance Needed: used [employer] at [employer] ADL's / Homemaking Assistance Needed: requires assist with lowerbody dressing            OT Problem List: Decreased strength;Decreased activity tolerance;Pain;Decreased knowledge of precautions;Decreased knowledge of use of DME or AE      OT Treatment/Interventions: Self-care/ADL training;DME and/or AE instruction;Therapeutic exercise;Patient/family education;Balance training;Therapeutic activities    OT Goals(Current goals can be found in the care plan  section) Acute Rehab OT Goals Patient Stated Goal: get to recliner OT Goal Formulation: With patient Time For Goal Achievement: 07/09/18 Potential to Achieve Goals: Good  OT Frequency: Min 2X/week   Barriers to D/C:            Co-evaluation PT/OT/SLP Co-Evaluation/Treatment: Yes Reason for Co-Treatment: Complexity of the patient's impairments (multi-system involvement) PT goals addressed during session: Other (comment)(mobility)        AM-PAC OT "6 Clicks" Daily Activity     Outcome Measure Help from another person eating meals?: None Help from another person taking care of personal grooming?: A Little Help from another person toileting, which includes using toliet, bedpan, or urinal?: A Lot Help from another person bathing (including washing, rinsing, drying)?: A Lot Help from another person to put on and taking off regular upper body clothing?: A Little Help from another person to put on and taking off regular lower body clothing?: A Lot 6 Click Score: 16   End of Session Equipment Utilized During Treatment: Other (comment)(binder)  Activity Tolerance: Other (comment)(limited due to BP) Patient left: in bed;Other (comment)(nurse and physician in room)  OT Visit Diagnosis: Pain;Muscle weakness (generalized) (M62.81) Pain - Right/Left: (sacrum) Pain - part of body: (sacrum)                Time: 0950-1003 OT Time Calculation (min): 13 min Charges:  OT General Charges $OT Visit: 1 Visit OT Evaluation $OT Eval Low Complexity: 1 Low   Madison Riggs Madison Riggs OTR/[place] 07/02/2018, 11:00 AM

## 2018-07-02 NOTE — NC FL2 (Signed)
[place] LEVEL OF CARE SCREENING TOOL     IDENTIFICATION  Patient Name: Madison Riggs Birthdate: 1928-05-04 Sex: female Admission Date (Current Location): 07/01/2018  [facility] and [place] Number:  Herbalist and Address:  [facility], [place] [address] [place], [place] [address]      Provider Number: [id number]  Attending Physician Name and Address:  Dessa Phi, DO  Relative Name and Phone Number:  Freda Jaquith, son, [phone]    Current Level of Care: Hospital Recommended Level of Care: [place] Prior Approval Number:    Date Approved/Denied:   PASRR Number: [id number]  Discharge Plan: SNF    Current Diagnoses: Patient Active Problem List   Diagnosis Date Noted  . Pressure injury of skin 07/02/2018  . Atrial fibrillation, chronic 06/10/2018  . Atrial fibrillation with RVR ([place]) 06/10/2018  . HLD (hyperlipidemia) 06/10/2018  . GERD (gastroesophageal reflux disease) 06/10/2018  . Hypothyroidism 06/10/2018  . Fall 06/10/2018  . Positive occult stool blood test 06/10/2018  . Contusion of right elbow 03/02/2018  . Pain in joint of right elbow 03/02/2018  . Anasarca 02/28/2018  . CKD (chronic kidney disease), stage IV ([place]) 02/28/2018  . Chronic diastolic HF (heart failure) ([place]) 10/30/2016  . Abnormal EKG 10/30/2016  . Nonrheumatic aortic valve stenosis 10/30/2016  . Iron deficiency anemia 10/16/2016  . Acute renal failure (ARF) ([place]) 10/16/2016  . Hypertension 10/16/2016  . Type II diabetes mellitus with renal manifestations ([place]) 10/16/2016  . Dyspnea and respiratory abnormalities 06/02/2016    Orientation RESPIRATION BLADDER Height & Weight     Self, Time, Situation, Place  Normal External catheter, Incontinent Weight: 70.1 kg Height:  5\' 2"  (157.5 cm)  BEHAVIORAL SYMPTOMS/MOOD NEUROLOGICAL BOWEL NUTRITION STATUS      Continent Diet(please see DC summary)  AMBULATORY STATUS  COMMUNICATION OF NEEDS Skin   Limited Assist Verbally PU Stage and Appropriate Care(PU II sacrum)                       Personal Care Assistance Level of Assistance  Bathing, Feeding, Dressing Bathing Assistance: Limited assistance Feeding assistance: Independent Dressing Assistance: Limited assistance     Functional Limitations Info  Sight, Hearing, Speech Sight Info: Impaired Hearing Info: Impaired Speech Info: Adequate    SPECIAL CARE FACTORS FREQUENCY  PT (By licensed PT), OT (By licensed OT)     PT Frequency: 5x/week OT Frequency: 5x/week            Contractures Contractures Info: Not present    Additional Factors Info  Code Status, Allergies, Psychotropic, Insulin Sliding Scale Code Status Info: Full Allergies Info: No Known Allergies Psychotropic Info: trazadone, effexor Insulin Sliding Scale Info: novolog 3x/day with meals       Current Medications (07/02/2018):  This is the current hospital active medication list Current Facility-Administered Medications  Medication Dose Route Frequency Provider Last Rate Last Dose  . acetaminophen (TYLENOL) tablet 650 mg  650 mg Oral Q6H PRN Karmen Bongo, MD       Or  . acetaminophen (TYLENOL) suppository 650 mg  650 mg Rectal Q6H PRN Karmen Bongo, MD      . allopurinol (ZYLOPRIM) tablet 100 mg  100 mg Oral BID Karmen Bongo, MD   100 mg at 07/02/18 0954  . amiodarone (PACERONE) tablet 200 mg  200 mg Oral Daily Karmen Bongo, MD   200 mg at 07/02/18 0954  . bisacodyl (DULCOLAX) EC tablet 5 mg  5 mg Oral Daily PRN Karmen Bongo, MD      . calcium carbonate (dosed in mg elemental calcium) suspension 500 mg of elemental calcium  500 mg of elemental calcium Oral Q6H PRN Karmen Bongo, MD      . camphor-menthol [facility]) lotion 1 application  1 application Topical Z1I PRN Karmen Bongo, MD       And  . hydrOXYzine (ATARAX/VISTARIL) tablet 25 mg  25 mg Oral Q8H PRN Karmen Bongo, MD      . Darbepoetin Alfa  (ARANESP) injection 200 mcg  200 mcg Subcutaneous Q Sat-1800 Corliss Parish, MD      . docusate sodium [facility]) enema 283 mg  1 enema Rectal PRN Karmen Bongo, MD      . feeding supplement (NEPRO CARB STEADY) liquid 237 mL  237 mL Oral TID PRN Karmen Bongo, MD      . ferric gluconate (NULECIT) 125 mg in sodium chloride 0.9 % 100 mL IVPB  125 mg Intravenous Daily Corliss Parish, MD 110 mL/hr at 07/02/18 0956 125 mg at 07/02/18 0956  . fluticasone (FLONASE) 50 MCG/ACT nasal spray 1 spray  1 spray Each Nare Daily Karmen Bongo, MD   1 spray at 07/02/18 0955  . furosemide (LASIX) injection 40 mg  40 mg Intravenous Lillia Mountain, MD   40 mg at 07/02/18 1016  . [START ON 07/03/2018] gabapentin (NEURONTIN) capsule 100 mg  100 mg Oral QHS Corliss Parish, MD      . insulin aspart (novoLOG) injection 0-9 Units  0-9 Units Subcutaneous TID WC Dessa Phi, DO      . levothyroxine (SYNTHROID, LEVOTHROID) tablet 100 mcg  100 mcg Oral QAC breakfast Karmen Bongo, MD   100 mcg at 07/02/18 0553  . loratadine (CLARITIN) tablet 10 mg  10 mg Oral Daily Karmen Bongo, MD   10 mg at 07/02/18 0954  . Melatonin TABS 3 mg  3 mg Oral QHS Karmen Bongo, MD   3 mg at 07/01/18 2215  . midodrine (PROAMATINE) tablet 10 mg  10 mg Oral TID WC Corliss Parish, MD   10 mg at 07/02/18 0758  . ondansetron (ZOFRAN) tablet 4 mg  4 mg Oral Q6H PRN Karmen Bongo, MD       Or  . ondansetron [facility] injection 4 mg  4 mg Intravenous Q6H PRN Karmen Bongo, MD      . pantoprazole (PROTONIX) EC tablet 40 mg  40 mg Oral BID Karmen Bongo, MD   40 mg at 07/02/18 0954  . senna-docusate (Senokot-S) tablet 1 tablet  1 tablet Oral BID Karmen Bongo, MD   1 tablet at 07/02/18 0954  . simvastatin (ZOCOR) tablet 20 mg  20 mg Oral Daily Karmen Bongo, MD   20 mg at 07/02/18 0954  . sorbitol 70 % solution 30 mL  30 mL Oral PRN Karmen Bongo, MD      . traZODone (DESYREL) tablet 100 mg  100 mg Oral  Ivery Quale, MD   100 mg at 07/01/18 2215  . venlafaxine XR (EFFEXOR-XR) 24 hr capsule 75 mg  75 mg Oral Daily Karmen Bongo, MD   75 mg at 07/02/18 0953  . zolpidem (AMBIEN) tablet 5 mg  5 mg Oral QHS PRN Karmen Bongo, MD         Discharge Medications: Please see discharge summary for a list of discharge medications.  Relevant Imaging Results:  Relevant Lab Results:   Additional Information SSN: [id number]  Estanislado Emms, LCSW

## 2018-07-02 NOTE — Progress Notes (Addendum)
PROGRESS NOTE    KEITRA CARUSONE  ZOX:[id number] DOB: [birth year] DOA: 07/01/2018 PCP: Rolene Course, PA-C     Brief Narrative:  Madison Riggs is a 85 y.o. female with medical history significant of OSA on BIPAP; dementia; diastolic CHF; h/o gastric bypass; hypothyroidism; HLD; HTN; DM; and stage 4 CKD presenting with LE edema and hypotension.  She was previously admitted from 12/26-1/6 for new-onset afib with RVR and acute on chronic diastolic CHF.  She had ascites and had paracentesis done with 6+L drained.  She was discharged to SNF.  She was admitted with anasarca, questionable cardiorenal syndrome.  Nephrology consulted.  Patient was started on IV diuresis, Midrin for hypotension.  New events last 24 hours / Subjective: No acute events overnight, patient remains comfortable on room air.  Discussed patient's frailty, CKD, volume overload, hypotension with son.  Recommended discussion of goals of care with palliative care team.  Assessment & Plan:   Principal Problem:   Anasarca Active Problems:   Iron deficiency anemia   Hypertension   Type II diabetes mellitus with renal manifestations (HCC)   Chronic diastolic HF (heart failure) (HCC)   CKD (chronic kidney disease), stage IV (HCC)   Atrial fibrillation, chronic   HLD (hyperlipidemia)   Hypothyroidism   Pressure injury of skin   Anasarca -Volume overloaded, complicated by hypoalbuminemia with marked third spacing, diastolic heart failure, CKD stage IV, concern for cardiorenal syndrome -Has required paracentesis last admission, currently procedure on hold due to hypotension -Continue IV diuresis Lasix 40 mg every 12 hours ordered  Hypotension -Continue midodrine  CKD stage IV -Nephrology following, patient not a candidate for dialysis at this point  Chronic atrial fibrillation -Patient not a candidate for anticoagulation due to frailty, falls, history of GI bleed, anemia -Continue amiodarone   Type 2  diabetes -Diet controlled, continue sliding scale insulin  Lipidemia -Continue Zocor  Hypothyroidism -Continue Synthroid  Anemia of chronic kidney disease -Monitor, Nulecit ordered  Depression -Continue Effexor   DVT prophylaxis: SCD Code Status: Full code Family Communication: Family at bedside, discussed with son over the phone son is healthcare power of attorney Disposition Plan: Pending further goals of care discussion with patient and son with palliative care team   Consultants:   Nephrology   Palliative care team  Procedures:   None  Antimicrobials:  Anti-infectives (From admission, onward)   None        Objective: Vitals:   07/01/18 2128 07/02/18 0604 07/02/18 0954 07/02/18 1001  BP: (!) 83/55 (!) 85/57 (!) 84/57 (!) 69/45  Pulse: 82 79 86 80  Resp: 16 17    Temp: 97.6 F (36.4 C) 97.7 F (36.5 C)    TempSrc: Oral Oral    SpO2: 99% 97%    Weight:      Height:        Intake/Output Summary (Last 24 hours) at 07/02/2018 1035 Last data filed at 07/02/2018 0900 Gross per 24 hour  Intake 1100 ml  Output 1450 ml  Net -350 ml   Filed Weights   07/01/18 1251  Weight: 70.1 kg    Examination:  General exam: Appears calm and comfortable  Respiratory system: Diminished breath sounds. Respiratory effort normal. Cardiovascular system: S1 & S2 heard, irregular. +Dependent pitting edema  Gastrointestinal system: Abdomen is nondistended, soft and nontender. Central nervous system: Alert and oriented. No focal neurological deficits. Extremities: Symmetric  Skin: No rashes, lesions or ulcers Psychiatry: Judgement and insight appear normal. Mood & affect appropriate.  Data Reviewed: I have personally reviewed following labs and imaging studies  CBC: Recent Labs  Lab 07/01/18 1249 07/02/18 0250  WBC 7.2 6.6  NEUTROABS 5.9  --   HGB 8.4* 7.5*  HCT 28.3* 25.1*  MCV 101.4* 98.0  PLT 245 779   Basic Metabolic Panel: Recent Labs  Lab  07/01/18 1249 07/02/18 0250  NA 137 139  K 5.1 4.3  CL 109 109  CO2 19* 21*  GLUCOSE 80 70  BUN 61* 59*  CREATININE 1.87* 2.00*  CALCIUM 7.9* 7.9*   GFR: Estimated Creatinine Clearance: 17.8 mL/min (A) (by C-G formula based on SCr of 2 mg/dL (H)). Liver Function Tests: Recent Labs  Lab 07/01/18 1249  AST 39  ALT 25  ALKPHOS 110  BILITOT 0.5  PROT 5.4*  ALBUMIN 2.2*   No results for input(s): LIPASE, AMYLASE in the last 168 hours. No results for input(s): AMMONIA in the last 168 hours. Coagulation Profile: No results for input(s): INR, PROTIME in the last 168 hours. Cardiac Enzymes: Recent Labs  Lab 07/01/18 1249  TROPONINI <0.03   BNP (last 3 results) No results for input(s): PROBNP in the last 8760 hours. HbA1C: No results for input(s): HGBA1C in the last 72 hours. CBG: No results for input(s): GLUCAP in the last 168 hours. Lipid Profile: No results for input(s): CHOL, HDL, LDLCALC, TRIG, CHOLHDL, LDLDIRECT in the last 72 hours. Thyroid Function Tests: No results for input(s): TSH, T4TOTAL, FREET4, T3FREE, THYROIDAB in the last 72 hours. Anemia Panel: No results for input(s): VITAMINB12, FOLATE, FERRITIN, TIBC, IRON, RETICCTPCT in the last 72 hours. Sepsis Labs: Recent Labs  Lab 07/01/18 1533 07/01/18 1712  LATICACIDVEN 0.67 0.54    No results found for this or any previous visit (from the past 240 hour(s)).     Radiology Studies: Dg Chest Port 1 View  Result Date: 07/01/2018 CLINICAL DATA:  Rales. EXAM: PORTABLE CHEST 1 VIEW COMPARISON:  Radiographs of June 09, 2018. FINDINGS: Stable cardiomegaly with central pulmonary vascular congestion. No pneumothorax is noted. Possible minimal bibasilar edema is noted. Minimal right pleural effusion is noted. Bony thorax is unremarkable. IMPRESSION: Stable cardiomegaly with central pulmonary vascular congestion and possible minimal bibasilar edema. Stable minimal right pleural effusion. Electronically Signed    By: Marijo Conception, M.D.   On: 07/01/2018 13:19      Scheduled Meds: . allopurinol  100 mg Oral BID  . amiodarone  200 mg Oral Daily  . darbepoetin (ARANESP) injection - NON-DIALYSIS  200 mcg Subcutaneous Q Sat-1800  . fluticasone  1 spray Each Nare Daily  . furosemide  40 mg Intravenous Q12H  . [START ON 07/03/2018] gabapentin  100 mg Oral QHS  . levothyroxine  100 mcg Oral QAC breakfast  . loratadine  10 mg Oral Daily  . Melatonin  3 mg Oral QHS  . midodrine  10 mg Oral TID WC  . pantoprazole  40 mg Oral BID  . senna-docusate  1 tablet Oral BID  . simvastatin  20 mg Oral Daily  . traZODone  100 mg Oral QHS  . venlafaxine XR  75 mg Oral Daily   Continuous Infusions: . ferric gluconate (FERRLECIT/NULECIT) IV 125 mg (07/02/18 0956)     LOS: 1 day    Time spent: 40 minutes   Dessa Phi, DO [facility] [email or web address] 07/02/2018, 10:35 AM

## 2018-07-02 NOTE — Progress Notes (Signed)
Initial Nutrition Assessment  DOCUMENTATION CODES:   Non-severe (moderate) malnutrition in context of chronic illness  INTERVENTION:   - Continue Nepro Shake po PRN, each supplement provides 425 kcal and 19 grams protein  - PM snack daily  NUTRITION DIAGNOSIS:   Moderate Malnutrition related to chronic illness (CHF, CKD stage IV, dementia) as evidenced by mild muscle depletion, moderate muscle depletion, mild fat depletion, moderate fat depletion.  GOAL:   Patient will meet greater than or equal to 90% of their needs  MONITOR:   PO intake, Supplement acceptance, Labs, I & O's, Skin, Weight trends  REASON FOR ASSESSMENT:   Consult Assessment of nutrition requirement/status  ASSESSMENT:   85 year old femalewith PMH significant for OSA on BIPAP, dementia, CHF, h/o gastric bypass, HLD, HTN, DM, and CKD stage IV. Pt presented with LE edema and hypotension. Pt was previously admitted from 12/26-1/6 for new-onset afib with RVR and acute on chronic diastolic CHF. She had ascites and had paracentesis done with 6+L drained. Pt was discharged to SNF. Pt was admitted with anasarca, questionable cardiorenal syndrome.  RN in room providing nursing care at time of visit.  Spoke with pt at bedside. Noted ~75% completed breakfast meal tray. Pt reports that she has a good appetite but due to gastric bypass surgery approximately 20 years ago, she cannot eat large amounts at each meal. Because of this, pt reports eating 3 smaller means with snacks in between. RD to order snack for pt during admission.  Breakfast: boiled egg, piece of [place] bacon, toast Lunch: sandwich or tomato soup with grilled cheese Dinner: whatever daughter-in-law cooks  Pt states that she has had some complications due to gastric bypass including anemia. Pt is unsure whether she has any other deficiencies but states she takes "lots of vitamins and supplements." Pt unable to name which ones.  Pt reports her UBW is 137  lbs but that it is up recently due to fluid. Pt is unsure how much. Per weight history in chart, pt's weight appears to have been stable over the last 1 year.  Meal Completion: 75%  Medications reviewed and include: Lasix, SSI, Protonix, Senna, IV ferric gluconate, PRN Nepro Shake  Labs reviewed: BUN 59 (H), creatinine 2.00 (H), hemoglobin 7.5 (L) CBG's: 86  UOP: 1450 ml x 24 hours  NUTRITION - FOCUSED PHYSICAL EXAM:    Most Recent Value  Orbital Region  Moderate depletion  Upper Arm Region  Mild depletion  Thoracic and Lumbar Region  Mild depletion  Buccal Region  Mild depletion  Temple Region  Mild depletion  Clavicle Bone Region  Moderate depletion  Clavicle and Acromion Bone Region  Moderate depletion  Scapular Bone Region  Unable to assess  Dorsal Hand  No depletion  Patellar Region  Mild depletion  Anterior Thigh Region  Mild depletion  Posterior Calf Region  Mild depletion  Edema (RD Assessment)  Mild [generalized]  Hair  Reviewed  Eyes  Reviewed  Mouth  Reviewed  Skin  Reviewed  Nails  Reviewed       Diet Order:   Diet Order            Diet renal with fluid restriction Fluid restriction: 1200 mL Fluid; Room service appropriate? Yes; Fluid consistency: Thin  Diet effective now              EDUCATION NEEDS:   No education needs have been identified at this time  Skin:  Skin Assessment: Skin Integrity Issues: Stage II: sacrum  Last BM:  1/16  Height:   Ht Readings from Last 1 Encounters:  07/01/18 5\' 2"  (1.575 m)    Weight:   Wt Readings from Last 1 Encounters:  07/01/18 70.1 kg    Ideal Body Weight:  50 kg  BMI:  Body mass index is 28.27 kg/m.  Estimated Nutritional Needs:   Kcal:  1400-1600  Protein:  70-85 grams  Fluid:  1.4-1.6 L    Gaynell Face, MS, RD, LDN Inpatient Clinical Dietitian Pager: [phone] Weekend/After Hours: [phone]

## 2018-07-03 LAB — CBC
HCT: 25.7 % — ABNORMAL LOW (ref 36.0–46.0)
Hemoglobin: 8.2 g/dL — ABNORMAL LOW (ref 12.0–15.0)
MCH: 31.4 pg (ref 26.0–34.0)
MCHC: 31.9 g/dL (ref 30.0–36.0)
MCV: 98.5 fL (ref 80.0–100.0)
PLATELETS: 211 10*3/uL (ref 150–400)
RBC: 2.61 MIL/uL — ABNORMAL LOW (ref 3.87–5.11)
RDW: 20 % — ABNORMAL HIGH (ref 11.5–15.5)
WBC: 7.5 10*3/uL (ref 4.0–10.5)
nRBC: 0.4 % — ABNORMAL HIGH (ref 0.0–0.2)

## 2018-07-03 LAB — RENAL FUNCTION PANEL
ANION GAP: 10 (ref 5–15)
Albumin: 2 g/dL — ABNORMAL LOW (ref 3.5–5.0)
BUN: 57 mg/dL — ABNORMAL HIGH (ref 8–23)
CO2: 21 mmol/L — ABNORMAL LOW (ref 22–32)
Calcium: 7.9 mg/dL — ABNORMAL LOW (ref 8.9–10.3)
Chloride: 108 mmol/L (ref 98–111)
Creatinine, Ser: 2.16 mg/dL — ABNORMAL HIGH (ref 0.44–1.00)
GFR calc non Af Amer: 20 mL/min — ABNORMAL LOW (ref >60)
GFR, EST AFRICAN AMERICAN: 23 mL/min — AB (ref >60)
Glucose, Bld: 62 mg/dL — ABNORMAL LOW (ref 70–99)
Phosphorus: 4.7 mg/dL — ABNORMAL HIGH (ref 2.5–4.6)
Potassium: 4 mmol/L (ref 3.5–5.1)
Sodium: 139 mmol/L (ref 135–145)

## 2018-07-03 LAB — GLUCOSE, CAPILLARY
GLUCOSE-CAPILLARY: 87 mg/dL (ref 70–99)
Glucose-Capillary: 100 mg/dL — ABNORMAL HIGH (ref 70–99)
Glucose-Capillary: 174 mg/dL — ABNORMAL HIGH (ref 70–99)

## 2018-07-03 LAB — MRSA PCR SCREENING: MRSA by PCR: NEGATIVE

## 2018-07-03 NOTE — Progress Notes (Signed)
Subjective:  Had 1600 of urinenegative 1 liter, weight supposedly up-BP remains low in the 70's/80's- she is completely asymptomatic -   There is not a manual cuff anywhere to be found on this unit - crt up slightly to 2.1- on airborne precautions- not sure why    Objective Vital signs in last 24 hours: Vitals:   07/02/18 1408 07/02/18 2130 07/03/18 0500 07/03/18 0523  BP: (!) 80/63 (!) 79/50  (!) 77/49  Pulse: 85 81  67  Resp: 16 17  18   Temp: 97.8 F (36.6 C) 98 F (36.7 C)  (!) 97.5 F (36.4 C)  TempSrc: Oral Oral  Oral  SpO2: 98% 98%  97%  Weight:   74.3 kg   Height:       Weight change: 4.2 kg  Intake/Output Summary (Last 24 hours) at 07/03/2018 0905 Last data filed at 07/03/2018 6720 Gross per 24 hour  Intake 540 ml  Output 1600 ml  Net -1060 ml     Assessment/Plan: 85 year old WF with multiple medical issues including CKD, hypoalbuminemia and hypotension with diastolic heart failure and volume overload  1.Renal- CKD- stage 3-4 - crt rises with appropriate diuresis- I would not let that hinder appropriate diuresis.  I would not consider her a candidate for dialysis given age , comorbids and hypotension would be fraught with difficulty- ongoing low BPs will also make trouble for her CKD- a cardiorenal type picture 2. Hypertension/volume  - volume overloaded but also hypoalbuminemic so now third spacing- no great way to deal with that.  I agree with IV diuretics - I have also increased her midodrine to max dosing.  She has no oxygen requirements - will dec neurontin to at night only - also not sure if BP is completely accurate- cannot check manually ??  Would not hold lasix as I do not think will drop her pressure  3. Anemia  - not helping with third spacing - iron was low last hosp-- giving iron and ESA- consider transfusion ?   A little better today  4. Dispo- agree with palliative care discussion- not sure how fixable this issue is- although also it does not seem to bother her  much.  Family meeting scheduled with palliative tomorrow     Louis Meckel    Labs: Basic Metabolic Panel: Recent Labs  Lab 07/01/18 1249 07/02/18 0250 07/03/18 0414  NA 137 139 139  K 5.1 4.3 4.0  CL 109 109 108  CO2 19* 21* 21*  GLUCOSE 80 70 62*  BUN 61* 59* 57*  CREATININE 1.87* 2.00* 2.16*  CALCIUM 7.9* 7.9* 7.9*  PHOS  --   --  4.7*   Liver Function Tests: Recent Labs  Lab 07/01/18 1249 07/03/18 0414  AST 39  --   ALT 25  --   ALKPHOS 110  --   BILITOT 0.5  --   PROT 5.4*  --   ALBUMIN 2.2* 2.0*   No results for input(s): LIPASE, AMYLASE in the last 168 hours. No results for input(s): AMMONIA in the last 168 hours. CBC: Recent Labs  Lab 07/01/18 1249 07/02/18 0250 07/03/18 0414  WBC 7.2 6.6 7.5  NEUTROABS 5.9  --   --   HGB 8.4* 7.5* 8.2*  HCT 28.3* 25.1* 25.7*  MCV 101.4* 98.0 98.5  PLT 245 208 211   Cardiac Enzymes: Recent Labs  Lab 07/01/18 1249  TROPONINI <0.03   CBG: Recent Labs  Lab 07/02/18 1231 07/02/18 1852 07/02/18 2136  07/03/18 0733  GLUCAP 86 144* 92 87    Iron Studies: No results for input(s): IRON, TIBC, TRANSFERRIN, FERRITIN in the last 72 hours. Studies/Results: Dg Chest Port 1 View  Result Date: 07/01/2018 CLINICAL DATA:  Rales. EXAM: PORTABLE CHEST 1 VIEW COMPARISON:  Radiographs of June 09, 2018. FINDINGS: Stable cardiomegaly with central pulmonary vascular congestion. No pneumothorax is noted. Possible minimal bibasilar edema is noted. Minimal right pleural effusion is noted. Bony thorax is unremarkable. IMPRESSION: Stable cardiomegaly with central pulmonary vascular congestion and possible minimal bibasilar edema. Stable minimal right pleural effusion. Electronically Signed   By: Marijo Conception, M.D.   On: 07/01/2018 13:19   Medications: Infusions: . ferric gluconate (FERRLECIT/NULECIT) IV 125 mg (07/02/18 0956)    Scheduled Medications: . allopurinol  100 mg Oral BID  . amiodarone  200 mg Oral  Daily  . darbepoetin (ARANESP) injection - NON-DIALYSIS  200 mcg Subcutaneous Q Sat-1800  . fluticasone  1 spray Each Nare Daily  . furosemide  40 mg Intravenous Q12H  . gabapentin  100 mg Oral QHS  . insulin aspart  0-9 Units Subcutaneous TID WC  . levothyroxine  100 mcg Oral QAC breakfast  . loratadine  10 mg Oral Daily  . Melatonin  3 mg Oral QHS  . midodrine  10 mg Oral TID WC  . pantoprazole  40 mg Oral BID  . senna-docusate  1 tablet Oral BID  . simvastatin  20 mg Oral Daily  . traZODone  100 mg Oral QHS  . venlafaxine XR  75 mg Oral Daily    have reviewed scheduled and prn medications.  Physical Exam: General: pleasant, NAD at all - ate breakfast  Heart: RRR Lungs: mostly clear Abdomen: soft , non tender Extremities: pitting edema    07/03/2018,9:05 AM  LOS: 2 days

## 2018-07-03 NOTE — Progress Notes (Addendum)
PROGRESS NOTE    Madison Riggs  FBP:[id number] DOB: January 12, 1928 DOA: 07/01/2018 PCP: Rolene Course, PA-C     Brief Narrative:  Madison Riggs is a 85 y.o. female with medical history significant of OSA on BIPAP; dementia; diastolic CHF; h/o gastric bypass; hypothyroidism; HLD; HTN; DM; and stage 4 CKD presenting with LE edema and hypotension.  She was previously admitted from 12/26-1/6 for new-onset afib with RVR and acute on chronic diastolic CHF.  She had ascites and had paracentesis done with 6+L drained.  She was discharged to SNF.  She was admitted with anasarca, questionable cardiorenal syndrome.  Nephrology consulted.  Patient was started on IV diuresis, midodrine for hypotension.  New events last 24 hours / Subjective: No acute events, states that her breathing is stable.  Remains on room air.  Assessment & Plan:   Principal Problem:   Anasarca Active Problems:   Iron deficiency anemia   Hypertension   Type II diabetes mellitus with renal manifestations (HCC)   Chronic diastolic HF (heart failure) (HCC)   CKD (chronic kidney disease), stage IV (HCC)   Atrial fibrillation, chronic   HLD (hyperlipidemia)   Hypothyroidism   Pressure injury of skin   Goals of care, counseling/discussion   Palliative care by specialist   DNR (do not resuscitate) discussion   Malnutrition of moderate degree   Anasarca -Volume overloaded, complicated by hypoalbuminemia with marked third spacing, diastolic heart failure, CKD stage IV, concern for cardiorenal syndrome -Has required paracentesis last admission -Continue IV diuresis Lasix 40 mg every 12 hours ordered, do not hold for hypotension unless overtly symptomatic   Hypotension -Continue midodrine  CKD stage IV -Nephrology following, patient not a candidate for dialysis at this point  Chronic atrial fibrillation -Patient not a candidate for anticoagulation due to frailty, falls, history of GI bleed, anemia -Continue amiodarone     Type 2 diabetes -Diet controlled, continue sliding scale insulin  Lipidemia -Continue Zocor  Hypothyroidism -Continue Synthroid  Anemia of chronic kidney disease -Monitor, nulecit   Depression -Continue Effexor  History of shingles -Patient states that she was treated for shingles as an outpatient a few weeks ago.  Currently, her outbreak has scabbed over, itching.  No need for airborne isolation at this time   DVT prophylaxis: SCD Code Status: Full code Family Communication: No family at bedside Disposition Plan: Pending further goals of care discussion with patient and son with palliative care team   Consultants:   Nephrology   Palliative care team  Procedures:   None  Antimicrobials:  Anti-infectives (From admission, onward)   None       Objective: Vitals:   07/02/18 1408 07/02/18 2130 07/03/18 0500 07/03/18 0523  BP: (!) 80/63 (!) 79/50  (!) 77/49  Pulse: 85 81  67  Resp: 16 17  18   Temp: 97.8 F (36.6 C) 98 F (36.7 C)  (!) 97.5 F (36.4 C)  TempSrc: Oral Oral  Oral  SpO2: 98% 98%  97%  Weight:   74.3 kg   Height:        Intake/Output Summary (Last 24 hours) at 07/03/2018 1038 Last data filed at 07/03/2018 0950 Gross per 24 hour  Intake 840 ml  Output 1600 ml  Net -760 ml   Filed Weights   07/01/18 1251 07/03/18 0500  Weight: 70.1 kg 74.3 kg    Examination: General exam: Appears calm and comfortable  Respiratory system: Diminished bibasilar. Respiratory effort normal. Cardiovascular system: S1 & S2 heard, RRR. No JVD,  murmurs, rubs, gallops or clicks. + Dependent edema  Gastrointestinal system: Abdomen is nondistended, soft and nontender. No organomegaly or masses felt. Normal bowel sounds heard. Central nervous system: Alert and oriented. No focal neurological deficits. Extremities: Symmetric 5 x 5 power. Skin: No rashes, lesions or ulcers Psychiatry: Judgement and insight appear normal. Mood & affect appropriate.    Data  Reviewed: I have personally reviewed following labs and imaging studies  CBC: Recent Labs  Lab 07/01/18 1249 07/02/18 0250 07/03/18 0414  WBC 7.2 6.6 7.5  NEUTROABS 5.9  --   --   HGB 8.4* 7.5* 8.2*  HCT 28.3* 25.1* 25.7*  MCV 101.4* 98.0 98.5  PLT 245 208 160   Basic Metabolic Panel: Recent Labs  Lab 07/01/18 1249 07/02/18 0250 07/03/18 0414  NA 137 139 139  K 5.1 4.3 4.0  CL 109 109 108  CO2 19* 21* 21*  GLUCOSE 80 70 62*  BUN 61* 59* 57*  CREATININE 1.87* 2.00* 2.16*  CALCIUM 7.9* 7.9* 7.9*  PHOS  --   --  4.7*   GFR: Estimated Creatinine Clearance: 17 mL/min (A) (by C-G formula based on SCr of 2.16 mg/dL (H)). Liver Function Tests: Recent Labs  Lab 07/01/18 1249 07/03/18 0414  AST 39  --   ALT 25  --   ALKPHOS 110  --   BILITOT 0.5  --   PROT 5.4*  --   ALBUMIN 2.2* 2.0*   No results for input(s): LIPASE, AMYLASE in the last 168 hours. No results for input(s): AMMONIA in the last 168 hours. Coagulation Profile: No results for input(s): INR, PROTIME in the last 168 hours. Cardiac Enzymes: Recent Labs  Lab 07/01/18 1249  TROPONINI <0.03   BNP (last 3 results) No results for input(s): PROBNP in the last 8760 hours. HbA1C: No results for input(s): HGBA1C in the last 72 hours. CBG: Recent Labs  Lab 07/02/18 1231 07/02/18 1852 07/02/18 2136 07/03/18 0733  GLUCAP 86 144* 92 87   Lipid Profile: No results for input(s): CHOL, HDL, LDLCALC, TRIG, CHOLHDL, LDLDIRECT in the last 72 hours. Thyroid Function Tests: No results for input(s): TSH, T4TOTAL, FREET4, T3FREE, THYROIDAB in the last 72 hours. Anemia Panel: No results for input(s): VITAMINB12, FOLATE, FERRITIN, TIBC, IRON, RETICCTPCT in the last 72 hours. Sepsis Labs: Recent Labs  Lab 07/01/18 1533 07/01/18 1712  LATICACIDVEN 0.67 0.54    Recent Results (from the past 240 hour(s))  Blood culture (routine x 2)     Status: None (Preliminary result)   Collection Time: 07/01/18  3:20 PM    Result Value Ref Range Status   Specimen Description BLOOD RIGHT ANTECUBITAL  Final   Special Requests   Final    BOTTLES DRAWN AEROBIC AND ANAEROBIC Blood Culture adequate volume   Culture   Final    NO GROWTH < 24 HOURS Performed at [facility], [place] [address] [place], [place] [address]    Report Status PENDING  Incomplete  Blood culture (routine x 2)     Status: None (Preliminary result)   Collection Time: 07/01/18  3:28 PM  Result Value Ref Range Status   Specimen Description BLOOD RIGHT ARM  Final   Special Requests   Final    BOTTLES DRAWN AEROBIC AND ANAEROBIC Blood Culture adequate volume   Culture   Final    NO GROWTH < 24 HOURS Performed at [facility], [place] [address] [place], [place] [address]    Report Status PENDING  Incomplete  MRSA PCR Screening     Status: None   Collection Time: 07/03/18  8:15 AM  Result Value Ref Range Status   MRSA by PCR NEGATIVE NEGATIVE Final    Comment:        The GeneXpert MRSA Assay (FDA approved for NASAL specimens only), is one component of a comprehensive MRSA colonization surveillance program. It is not intended to diagnose MRSA infection nor to guide or monitor treatment for MRSA infections. Performed at [facility], [place] [address] [place], [place] [address]        Radiology Studies: Dg Chest Port 1 View  Result Date: 07/01/2018 CLINICAL DATA:  Rales. EXAM: PORTABLE CHEST 1 VIEW COMPARISON:  Radiographs of June 09, 2018. FINDINGS: Stable cardiomegaly with central pulmonary vascular congestion. No pneumothorax is noted. Possible minimal bibasilar edema is noted. Minimal right pleural effusion is noted. Bony thorax is unremarkable. IMPRESSION: Stable cardiomegaly with central pulmonary vascular congestion and possible minimal bibasilar edema. Stable minimal right pleural effusion. Electronically Signed   By: Marijo Conception, M.D.   On: 07/01/2018 13:19      Scheduled Meds: . allopurinol  100 mg  Oral BID  . amiodarone  200 mg Oral Daily  . darbepoetin (ARANESP) injection - NON-DIALYSIS  200 mcg Subcutaneous Q Sat-1800  . fluticasone  1 spray Each Nare Daily  . furosemide  40 mg Intravenous Q12H  . gabapentin  100 mg Oral QHS  . insulin aspart  0-9 Units Subcutaneous TID WC  . levothyroxine  100 mcg Oral QAC breakfast  . loratadine  10 mg Oral Daily  . Melatonin  3 mg Oral QHS  . midodrine  10 mg Oral TID WC  . pantoprazole  40 mg Oral BID  . senna-docusate  1 tablet Oral BID  . simvastatin  20 mg Oral Daily  . traZODone  100 mg Oral QHS  . venlafaxine XR  75 mg Oral Daily   Continuous Infusions: . ferric gluconate (FERRLECIT/NULECIT) IV 125 mg (07/02/18 0956)     LOS: 2 days    Time spent: 47minutes   Dessa Phi, DO [facility] [email or web address] 07/03/2018, 10:38 AM

## 2018-07-03 NOTE — Plan of Care (Signed)

## 2018-07-03 NOTE — Plan of Care (Signed)
  Problem: Clinical Measurements: Goal: Will remain free from infection Outcome: Progressing   Problem: Nutrition: Goal: Adequate nutrition will be maintained Outcome: Progressing   Problem: Coping: Goal: Level of anxiety will decrease Outcome: Progressing   Problem: Pain Managment: Goal: General experience of comfort will improve Outcome: Progressing   Problem: Safety: Goal: Ability to remain free from injury will improve Outcome: Progressing   Problem: Skin Integrity: Goal: Risk for impaired skin integrity will decrease Outcome: Progressing   

## 2018-07-04 ENCOUNTER — Encounter (HOSPITAL_COMMUNITY): Payer: Self-pay | Admitting: General Practice

## 2018-07-04 DIAGNOSIS — I5033 Acute on chronic diastolic (congestive) heart failure: Secondary | ICD-10-CM

## 2018-07-04 DIAGNOSIS — N184 Chronic kidney disease, stage 4 (severe): Secondary | ICD-10-CM

## 2018-07-04 LAB — RENAL FUNCTION PANEL
Albumin: 2 g/dL — ABNORMAL LOW (ref 3.5–5.0)
Anion gap: 12 (ref 5–15)
BUN: 56 mg/dL — AB (ref 8–23)
CHLORIDE: 105 mmol/L (ref 98–111)
CO2: 20 mmol/L — ABNORMAL LOW (ref 22–32)
Calcium: 8 mg/dL — ABNORMAL LOW (ref 8.9–10.3)
Creatinine, Ser: 2.02 mg/dL — ABNORMAL HIGH (ref 0.44–1.00)
GFR calc Af Amer: 25 mL/min — ABNORMAL LOW (ref >60)
GFR calc non Af Amer: 21 mL/min — ABNORMAL LOW (ref >60)
Glucose, Bld: 81 mg/dL (ref 70–99)
POTASSIUM: 3.9 mmol/L (ref 3.5–5.1)
Phosphorus: 4.4 mg/dL (ref 2.5–4.6)
Sodium: 137 mmol/L (ref 135–145)

## 2018-07-04 LAB — GLUCOSE, CAPILLARY
Glucose-Capillary: 85 mg/dL (ref 70–99)
Glucose-Capillary: 99 mg/dL (ref 70–99)
Glucose-Capillary: 78 mg/dL (ref 70–99)
Glucose-Capillary: 126 mg/dL — ABNORMAL HIGH (ref 70–99)
Glucose-Capillary: 142 mg/dL — ABNORMAL HIGH (ref 70–99)

## 2018-07-04 NOTE — Progress Notes (Signed)
Physical Therapy Treatment Patient Details Name: Madison Riggs MRN: [id number] DOB: May 31, 1928 Today's Date: 07/04/2018    History of Present Illness 85 y.o. female with medical history significant of OSA on BIPAP; dementia; diastolic CHF; h/o gastric bypass; hypothyroidism; HLD; HTN; DM; and stage 4 CKD presenting with LE edema and hypotension.  Recently discharged to rehab.     PT Comments    Pt in bed on entry, she states that she was up to the chair but it hurt her bottom, but pt agrees to short walk. Pt BP improved since evaluation. Pt currently limited is safe mobility by decreased strength and endurance. Pt is currently min-modA for bed mobility, min-modA for transfers and minA for ambulation of 12 feet with RW. Pt did become incontinent of stool with ambulation. D/c plans remain appropriate at this time. PT will continue to follow acutely.     Follow Up Recommendations  SNF     Equipment Recommendations  None recommended by PT    Recommendations for Other Services       Precautions / Restrictions Precautions Precautions: Fall Precaution Comments: watch BP Restrictions Weight Bearing Restrictions: No    Mobility  Bed Mobility Overal bed mobility: Needs Assistance Bed Mobility: Supine to Sit;Sit to Supine     Supine to sit: Min assist Sit to supine: Min assist   General bed mobility comments: min A for bringing trunk to upright to come into seated at EoB required minA for management of LE back into bed   Transfers Overall transfer level: Needs assistance Equipment used: Rolling walker (2 wheeled) Transfers: Sit to/from Stand Sit to Stand: Mod assist;Min assist         General transfer comment: modA for initial sit>stand from bed surface, minA for sit>stand from recliner  Ambulation/Gait Ambulation/Gait assistance: Min assist Gait Distance (Feet): 12 Feet Assistive device: Rolling walker (2 wheeled) Gait Pattern/deviations: Step-through  pattern;Decreased stride length;Trunk flexed Gait velocity: slowed Gait velocity interpretation: <1.31 ft/sec, indicative of household ambulator General Gait Details: minA for steadying with RW, pt became incontinent of stool on way back to bed, sat in recliner to clean up before returning to bed       Balance Overall balance assessment: Needs assistance Sitting-balance support: No upper extremity supported;Feet supported Sitting balance-Leahy Scale: Fair     Standing balance support: Bilateral upper extremity supported Standing balance-Leahy Scale: Poor Standing balance comment: requires UE support for steadying                            Cognition Arousal/Alertness: Awake/alert Behavior During Therapy: WFL for tasks assessed/performed Overall Cognitive Status: Within Functional Limits for tasks assessed                                           General Comments General comments (skin integrity, edema, etc.): BP 101/72, pt report feeling better.       Pertinent Vitals/Pain Pain Assessment: Faces Faces Pain Scale: Hurts even more Pain Location: sacrum, foam dressing in place Pain Descriptors / Indicators: Aching;Spasm Pain Intervention(s): Limited activity within patient's tolerance;Monitored during session;Premedicated before session;Repositioned           PT Goals (current goals can now be found in the care plan section) Acute Rehab PT Goals Patient Stated Goal: get to recliner PT Goal Formulation: With patient Time For Goal Achievement:  07/16/18 Potential to Achieve Goals: Good    Frequency    Min 2X/week      PT Plan Current plan remains appropriate       AM-PAC PT "6 Clicks" Mobility   Outcome Measure  Help needed turning from your back to your side while in a flat bed without using bedrails?: A Little Help needed moving from lying on your back to sitting on the side of a flat bed without using bedrails?: A Little Help  needed moving to and from a bed to a chair (including a wheelchair)?: A Little Help needed standing up from a chair using your arms (e.g., wheelchair or bedside chair)?: A Little Help needed to walk in hospital room?: A Little Help needed climbing 3-5 steps with a railing? : A Lot 6 Click Score: 17    End of Session Equipment Utilized During Treatment: Gait belt Activity Tolerance: Patient tolerated treatment well;Treatment limited secondary to medical complications (Comment) Patient left: in bed;with call bell/phone within reach;with bed alarm set;with nursing/sitter in room;with SCD's reapplied Nurse Communication: Mobility status PT Visit Diagnosis: Difficulty in walking, not elsewhere classified (R26.2)     Time: 4503-8882 PT Time Calculation (min) (ACUTE ONLY): 24 min  Charges:  $Gait Training: 8-22 mins $Therapeutic Activity: 8-22 mins                     Pedro Oldenburg B. Migdalia Dk PT, DPT Acute Rehabilitation Services Pager [phone] Office [phone]    [place] 07/04/2018, 1:36 PM

## 2018-07-04 NOTE — Care Management Important Message (Signed)
Important Message  Patient Details  Name: Madison Riggs MRN: [id number] Date of Birth: 12-May-1928   Medicare Important Message Given:  Yes    Orbie Pyo 07/04/2018, 4:25 PM

## 2018-07-04 NOTE — Progress Notes (Addendum)
PROGRESS NOTE    Madison Riggs  DQQ:[id number] DOB: [birth year] DOA: 07/01/2018 PCP: Rolene Course, PA-C     Brief Narrative:  Madison Riggs is a 85 y.o. female with medical history significant of OSA on BIPAP; dementia; diastolic CHF; h/o gastric bypass; hypothyroidism; HLD; HTN; DM; and stage 4 CKD presenting with LE edema and hypotension.  She was previously admitted from 12/26-1/6 for new-onset afib with RVR and acute on chronic diastolic CHF.  She had ascites and had paracentesis done with 6+L drained.  She was discharged to SNF.  She was admitted with anasarca, questionable cardiorenal syndrome.  Nephrology consulted.  Patient was started on IV diuresis, midodrine for hypotension.  New events last 24 hours / Subjective: Patient sitting in chair, complaining of soreness on her bottom.  Denies any shortness of breath today.  Continues to be fluid overloaded.  BP continues to be low.  Assessment & Plan:   Principal Problem:   Anasarca Active Problems:   Iron deficiency anemia   Hypertension   Type II diabetes mellitus with renal manifestations (HCC)   Chronic diastolic HF (heart failure) (HCC)   CKD (chronic kidney disease), stage IV (HCC)   Atrial fibrillation, chronic   HLD (hyperlipidemia)   Hypothyroidism   Pressure injury of skin   Goals of care, counseling/discussion   Palliative care by specialist   DNR (do not resuscitate) discussion   Malnutrition of moderate degree   Anasarca -Volume overloaded, complicated by hypoalbuminemia with marked third spacing, diastolic heart failure, CKD stage IV, concern for cardiorenal syndrome -Has required paracentesis last admission -Continue IV diuresis Lasix 40 mg every 12 hours ordered, do not hold for hypotension unless overtly symptomatic   Hypotension -Continue midodrine  CKD stage IV -Nephrology following, patient not a candidate for dialysis at this point  Chronic atrial fibrillation -Patient not a candidate for  anticoagulation due to frailty, falls, history of GI bleed, anemia -Continue amiodarone   Type 2 diabetes -Diet controlled, continue sliding scale insulin  Lipidemia -Continue Zocor  Hypothyroidism -Continue Synthroid  Anemia of chronic kidney disease -Monitor, nulecit   Depression -Continue Effexor  History of shingles -Patient states that she was treated for shingles as an outpatient a few weeks ago.  Currently, her outbreak has scabbed over, itching.  No need for airborne isolation at this time  Stage 2 pressure injury sacrum - POA    DVT prophylaxis: SCD Code Status: DNR Family Communication: No family at bedside Disposition Plan: Pending further goals of care discussion with patient and son with palliative care team, noted plan for family meeting this afternoon   Consultants:   Nephrology  Palliative care team  Procedures:   None  Antimicrobials:  Anti-infectives (From admission, onward)   None       Objective: Vitals:   07/03/18 2307 07/03/18 2308 07/04/18 0454 07/04/18 1048  BP: (!) 61/41 (!) 64/42 (!) 78/49 (!) 92/56  Pulse: 80  83 77  Resp:   16 16  Temp:   98.1 F (36.7 C) 97.9 F (36.6 C)  TempSrc:   Oral Oral  SpO2:   97% 99%  Weight:      Height:        Intake/Output Summary (Last 24 hours) at 07/04/2018 1147 Last data filed at 07/04/2018 0857 Gross per 24 hour  Intake 520 ml  Output 400 ml  Net 120 ml   Filed Weights   07/01/18 1251 07/03/18 0500  Weight: 70.1 kg 74.3 kg  Examination: General exam: Appears calm and comfortable  Respiratory system: Diminished breath sounds, no respiratory distress Cardiovascular system: S1 & S2 heard. No JVD, murmurs, rubs, gallops or clicks.  Bilateral pitting pedal edema, dependent edema Gastrointestinal system: Abdomen is nondistended, soft and nontender. Central nervous system: Alert and oriented. No focal neurological deficits. Extremities: Symmetric  Skin: No rashes, lesions or  ulcers Psychiatry: Judgement and insight appear normal. Mood & affect appropriate.    Data Reviewed: I have personally reviewed following labs and imaging studies  CBC: Recent Labs  Lab 07/01/18 1249 07/02/18 0250 07/03/18 0414  WBC 7.2 6.6 7.5  NEUTROABS 5.9  --   --   HGB 8.4* 7.5* 8.2*  HCT 28.3* 25.1* 25.7*  MCV 101.4* 98.0 98.5  PLT 245 208 767   Basic Metabolic Panel: Recent Labs  Lab 07/01/18 1249 07/02/18 0250 07/03/18 0414 07/04/18 0135  NA 137 139 139 137  K 5.1 4.3 4.0 3.9  CL 109 109 108 105  CO2 19* 21* 21* 20*  GLUCOSE 80 70 62* 81  BUN 61* 59* 57* 56*  CREATININE 1.87* 2.00* 2.16* 2.02*  CALCIUM 7.9* 7.9* 7.9* 8.0*  PHOS  --   --  4.7* 4.4   GFR: Estimated Creatinine Clearance: 18.2 mL/min (A) (by C-G formula based on SCr of 2.02 mg/dL (H)). Liver Function Tests: Recent Labs  Lab 07/01/18 1249 07/03/18 0414 07/04/18 0135  AST 39  --   --   ALT 25  --   --   ALKPHOS 110  --   --   BILITOT 0.5  --   --   PROT 5.4*  --   --   ALBUMIN 2.2* 2.0* 2.0*   No results for input(s): LIPASE, AMYLASE in the last 168 hours. No results for input(s): AMMONIA in the last 168 hours. Coagulation Profile: No results for input(s): INR, PROTIME in the last 168 hours. Cardiac Enzymes: Recent Labs  Lab 07/01/18 1249  TROPONINI <0.03   BNP (last 3 results) No results for input(s): PROBNP in the last 8760 hours. HbA1C: No results for input(s): HGBA1C in the last 72 hours. CBG: Recent Labs  Lab 07/03/18 0733 07/03/18 1220 07/03/18 1700 07/03/18 2055 07/04/18 0825  GLUCAP 87 85 100* 174* 99   Lipid Profile: No results for input(s): CHOL, HDL, LDLCALC, TRIG, CHOLHDL, LDLDIRECT in the last 72 hours. Thyroid Function Tests: No results for input(s): TSH, T4TOTAL, FREET4, T3FREE, THYROIDAB in the last 72 hours. Anemia Panel: No results for input(s): VITAMINB12, FOLATE, FERRITIN, TIBC, IRON, RETICCTPCT in the last 72 hours. Sepsis Labs: Recent Labs  Lab  07/01/18 1533 07/01/18 1712  LATICACIDVEN 0.67 0.54    Recent Results (from the past 240 hour(s))  Blood culture (routine x 2)     Status: None (Preliminary result)   Collection Time: 07/01/18  3:20 PM  Result Value Ref Range Status   Specimen Description BLOOD RIGHT ANTECUBITAL  Final   Special Requests   Final    BOTTLES DRAWN AEROBIC AND ANAEROBIC Blood Culture adequate volume   Culture   Final    NO GROWTH 3 DAYS Performed at [facility], [address] [place], [place] [address]    Report Status PENDING  Incomplete  Blood culture (routine x 2)     Status: None (Preliminary result)   Collection Time: 07/01/18  3:28 PM  Result Value Ref Range Status   Specimen Description BLOOD RIGHT ARM  Final   Special Requests   Final  BOTTLES DRAWN AEROBIC AND ANAEROBIC Blood Culture adequate volume   Culture   Final    NO GROWTH 3 DAYS Performed at [facility], [place] [address], [place], [place] [address]    Report Status PENDING  Incomplete  MRSA PCR Screening     Status: None   Collection Time: 07/03/18  8:15 AM  Result Value Ref Range Status   MRSA by PCR NEGATIVE NEGATIVE Final    Comment:        The GeneXpert MRSA Assay (FDA approved for NASAL specimens only), is one component of a comprehensive MRSA colonization surveillance program. It is not intended to diagnose MRSA infection nor to guide or monitor treatment for MRSA infections. Performed at [facility], [place] [address] [place], [place] [address]        Radiology Studies: No results found.    Scheduled Meds: . allopurinol  100 mg Oral BID  . amiodarone  200 mg Oral Daily  . darbepoetin (ARANESP) injection - NON-DIALYSIS  200 mcg Subcutaneous Q Sat-1800  . fluticasone  1 spray Each Nare Daily  . furosemide  40 mg Intravenous Q12H  . gabapentin  100 mg Oral QHS  . insulin aspart  0-9 Units Subcutaneous TID WC  . levothyroxine  100 mcg Oral QAC breakfast  . loratadine  10 mg Oral  Daily  . Melatonin  3 mg Oral QHS  . midodrine  10 mg Oral TID WC  . pantoprazole  40 mg Oral BID  . senna-docusate  1 tablet Oral BID  . simvastatin  20 mg Oral Daily  . traZODone  100 mg Oral QHS  . venlafaxine XR  75 mg Oral Daily   Continuous Infusions: . ferric gluconate (FERRLECIT/NULECIT) IV 125 mg (07/04/18 1100)     LOS: 3 days    Time spent: 17minutes   Dessa Phi, DO [facility] [email or web address] 07/04/2018, 11:47 AM

## 2018-07-04 NOTE — Progress Notes (Signed)
Palliative:  HPI: 85 yo female with PMH of CKD stage 3-4, diabetes, diastolic CHF, aortic stenosis, atrial fibrillation, hypotension, iron deficiency anemia, sleep apnea, syncope (NO h/o dementia or HTN per family) admitted 07/01/2018 with weakness and hypotension with CHF exacerbation (20 lb weight gain) and worsening renal failure with anasarca (albumin 2.0) and possible cardiorenal syndrome.   I met today with Madison Riggs and her family at bedside: son, daughter-in-law, and niece. We had a long discussion regarding the difficulties in balance of CHF and renal failure to try and improve Madison Riggs quality and quantity of life. I explained that close monitoring of low salt diet, daily weights, renal function monitoring, and use of diuretics even with the best of care and constant monitoring this is expected to worsen. They understand the tenuous state of her health. They understand that she is not a candidate for dialysis given her CHF, hypotension, and advanced age. Madison Riggs confirms her desire for DNR and her family are supportive of this decision.   Family are hopeful that Madison Riggs will continue diuresis and renal function will not worsen. They know that she could also decline further at any time. We discussed all options from here including outpatient palliative vs outpatient hospice, hospice facility (not eligible), rehab vs SNF long term care, ALF. They are hopeful for continued improvement and time. They are open to palliative and hospice options. She will likely need rehab or long term care options - CSW to follow and assist family.   Exam: Awake, alert, oriented. No distress, room air. Abd edematous - consider paracentesis. BLE 3+ edema.   Plan: - Continue diuresis as tolerated and indicated.  - Consider paracentesis as indicated.  - Likely SNF with palliative care (will continue to discuss hospice option as well).  - Interested in restarting iron infusions monthly as they feel she  did better on this regimen.   51 min   Vinie Sill, NP Palliative Medicine Team Pager # [phone] (M-F 8a-5p) Team Phone # [phone] (Nights/Weekends)

## 2018-07-04 NOTE — Progress Notes (Signed)
Patient ID: Madison Riggs, female   DOB: July 12, 1927, 85 y.o.   MRN: [id number] [facility] Progress Note   Assessment/ Plan:   1. Acute kidney Injury on chronic kidney disease stage III-IV: Likely secondary to diuretic use/activation of RAS axis.  Urine output overnight barely nonoliguric and labs this morning show essentially stable/unchanged renal function.  She does not have any acute electrolyte abnormalities or symptoms to prompt any intervention.  She is not a candidate for chronic outpatient dialysis based on her comorbidities, chronic hypotension and advanced age/limited functional status. 2.  Anasarca: This is complicated by hypoalbuminemia, diastolic heart failure and chronic kidney disease limiting aggression with diuretics.  Continue low-dose diuretic therapy at this time to limit hypotension. 3.  Hypotension: Continue midodrine with low-dose diuretics.  Unfortunately she appears to have decreased effective arterial blood volume compounding hypotension/renal perfusion. 4.  Anemia: On intravenous iron, status post darbepoetin.  Without overt loss.  Subjective:   Reports to be feeling fair, sat in recliner earlier today.   Objective:   BP (!) 92/56 (BP Location: Right Arm)   Pulse 77   Temp 97.9 F (36.6 C) (Oral)   Resp 16   Ht 5\' 2"  (1.575 m)   Wt 74.3 kg   SpO2 99%   BMI 29.96 kg/m   Intake/Output Summary (Last 24 hours) at 07/04/2018 1141 Last data filed at 07/04/2018 0857 Gross per 24 hour  Intake 520 ml  Output 400 ml  Net 120 ml   Weight change:   Physical Exam: Gen: Comfortably resting in bed, watching television CVS: Pulse regular rhythm, normal rate, S1 and S2 normal Resp: Poor inspiratory effort with decreased breath sounds over bases, no rales Abd: Soft, flat, nontender Ext: 1-2+ edema over lower extremities  Imaging: No results found.  Labs: BMET Recent Labs  Lab 07/01/18 1249 07/02/18 0250 07/03/18 0414 07/04/18 0135  NA 137  139 139 137  K 5.1 4.3 4.0 3.9  CL 109 109 108 105  CO2 19* 21* 21* 20*  GLUCOSE 80 70 62* 81  BUN 61* 59* 57* 56*  CREATININE 1.87* 2.00* 2.16* 2.02*  CALCIUM 7.9* 7.9* 7.9* 8.0*  PHOS  --   --  4.7* 4.4   CBC Recent Labs  Lab 07/01/18 1249 07/02/18 0250 07/03/18 0414  WBC 7.2 6.6 7.5  NEUTROABS 5.9  --   --   HGB 8.4* 7.5* 8.2*  HCT 28.3* 25.1* 25.7*  MCV 101.4* 98.0 98.5  PLT 245 208 211    Medications:    . allopurinol  100 mg Oral BID  . amiodarone  200 mg Oral Daily  . darbepoetin (ARANESP) injection - NON-DIALYSIS  200 mcg Subcutaneous Q Sat-1800  . fluticasone  1 spray Each Nare Daily  . furosemide  40 mg Intravenous Q12H  . gabapentin  100 mg Oral QHS  . insulin aspart  0-9 Units Subcutaneous TID WC  . levothyroxine  100 mcg Oral QAC breakfast  . loratadine  10 mg Oral Daily  . Melatonin  3 mg Oral QHS  . midodrine  10 mg Oral TID WC  . pantoprazole  40 mg Oral BID  . senna-docusate  1 tablet Oral BID  . simvastatin  20 mg Oral Daily  . traZODone  100 mg Oral QHS  . venlafaxine XR  75 mg Oral Daily   Elmarie Shiley, MD 07/04/2018, 11:41 AM

## 2018-07-05 DIAGNOSIS — I1310 Hypertensive heart and chronic kidney disease without heart failure, with stage 1 through stage 4 chronic kidney disease, or unspecified chronic kidney disease: Secondary | ICD-10-CM

## 2018-07-05 LAB — RENAL FUNCTION PANEL
Albumin: 2.1 g/dL — ABNORMAL LOW (ref 3.5–5.0)
Anion gap: 8 (ref 5–15)
BUN: 51 mg/dL — ABNORMAL HIGH (ref 8–23)
CO2: 23 mmol/L (ref 22–32)
Calcium: 8.4 mg/dL — ABNORMAL LOW (ref 8.9–10.3)
Chloride: 107 mmol/L (ref 98–111)
Creatinine, Ser: 1.99 mg/dL — ABNORMAL HIGH (ref 0.44–1.00)
GFR calc Af Amer: 25 mL/min — ABNORMAL LOW (ref >60)
GFR calc non Af Amer: 22 mL/min — ABNORMAL LOW (ref >60)
Glucose, Bld: 85 mg/dL (ref 70–99)
POTASSIUM: 3.3 mmol/L — AB (ref 3.5–5.1)
Phosphorus: 4.1 mg/dL (ref 2.5–4.6)
Sodium: 138 mmol/L (ref 135–145)

## 2018-07-05 LAB — GLUCOSE, CAPILLARY
GLUCOSE-CAPILLARY: 159 mg/dL — AB (ref 70–99)
Glucose-Capillary: 123 mg/dL — ABNORMAL HIGH (ref 70–99)
Glucose-Capillary: 88 mg/dL (ref 70–99)
Glucose-Capillary: 88 mg/dL (ref 70–99)

## 2018-07-05 MED ORDER — TORSEMIDE 20 MG PO TABS
20.0000 mg | ORAL_TABLET | Freq: Two times a day (BID) | ORAL | Status: DC
  Administered 2018-07-05 – 2018-07-07 (×5): 20 mg via ORAL
  Filled 2018-07-05 (×5): qty 1

## 2018-07-05 MED ORDER — POTASSIUM CHLORIDE CRYS ER 20 MEQ PO TBCR
40.0000 meq | EXTENDED_RELEASE_TABLET | Freq: Once | ORAL | Status: AC
  Administered 2018-07-05: 40 meq via ORAL
  Filled 2018-07-05: qty 2

## 2018-07-05 NOTE — Progress Notes (Addendum)
BP rechecked and was 82/54.  MD notified.  Will continue to monitor.  Eliezer Bottom [place]

## 2018-07-05 NOTE — Progress Notes (Signed)
Pt's BP this morning is 88/53 with the MAP of 65. Karn Pickler NP aware.

## 2018-07-05 NOTE — Progress Notes (Signed)
Palliative:  Madison Riggs is sitting on side of bed, no family/visitors at bedside. NT is working to bathe her. She endorses that she feels good. Concern this hospitalization for cardiorenal syndrome. Continues on room air and no shortness of breath (even with distended abd). Having decent urine output. She has no concerns and is awaiting to here from her son and CSW on placement from the hospital. Renal function stable. Emotional support provided.   Plan: - Transition to oral diuretics when medically indicated.  - Transition to SNF rehab with outpatient palliative and anticipation of long term care needs.  - Family hopeful for continued improvement.  - DNR was confirmed and they understand she is not a HD candidate when she declines further in the future.   15 min  Vinie Sill, NP Palliative Medicine Team Pager # [phone] (M-F 8a-5p) Team Phone # [phone] (Nights/Weekends)

## 2018-07-05 NOTE — Progress Notes (Signed)
PROGRESS NOTE    Madison Riggs  YBW:[id number] DOB: 05-24-1928 DOA: 07/01/2018 PCP: Camille Bal, PA-C     Brief Narrative:  Madison Riggs is a 85 y.o. female with medical history significant of OSA on BIPAP; dementia; diastolic CHF; h/o gastric bypass; hypothyroidism; HLD; HTN; DM; and stage 4 CKD presenting with LE edema and hypotension.  She was previously admitted from 12/26-1/6 for new-onset afib with RVR and acute on chronic diastolic CHF.  She had ascites and had paracentesis done with 6+L drained.  She was discharged to SNF.  She was admitted with anasarca, questionable cardiorenal syndrome.  Nephrology consulted.  Patient was started on IV diuresis, midodrine for hypotension.  New events last 24 hours / Subjective: Continues to have fluid overload, low blood pressure.  Apparently there is no manual blood cuff on 6 N.  Patient herself has no complaints today.  Eating breakfast on my examination.  Remains on room air.  Assessment & Plan:   Principal Problem:   Anasarca Active Problems:   Iron deficiency anemia   Hypertension   Type II diabetes mellitus with renal manifestations (HCC)   Chronic diastolic HF (heart failure) (HCC)   CKD (chronic kidney disease), stage IV (HCC)   Atrial fibrillation, chronic   HLD (hyperlipidemia)   Hypothyroidism   Pressure injury of skin   Goals of care, counseling/discussion   Palliative care by specialist   DNR (do not resuscitate) discussion   Malnutrition of moderate degree   Anasarca -Volume overloaded, complicated by hypoalbuminemia with marked third spacing, diastolic heart failure, CKD stage IV, concern for cardiorenal syndrome -Has required paracentesis last admission -Appreciate nephrology, patient switched to oral torsemide  Hypotension -Continue midodrine  CKD stage IV -Nephrology following, patient not a candidate for dialysis at this point  Chronic atrial fibrillation -Patient not a candidate for  anticoagulation due to frailty, falls, history of GI bleed, anemia -Continue amiodarone   Type 2 diabetes -Diet controlled, continue sliding scale insulin  Hyperlipidemia -Continue Zocor  Hypothyroidism -Continue Synthroid  Anemia of chronic kidney disease -Monitor, nulecit   Depression -Continue Effexor  History of shingles -Patient states that she was treated for shingles as an outpatient a few weeks ago.  Currently, her outbreak has scabbed over, itching.  No need for airborne isolation at this time  Stage 2 pressure injury sacrum - POA    DVT prophylaxis: SCD Code Status: DNR Family Communication: No family at bedside Disposition Plan: Hopefully can stabilize on oral regimen so she can be discharged to skilled nursing facility this week.  Overall prognosis very poor, appreciate palliative care team.   Consultants:   Nephrology  Palliative care team  Procedures:   None  Antimicrobials:  Anti-infectives (From admission, onward)   None       Objective: Vitals:   07/04/18 1209 07/04/18 2055 07/05/18 0508 07/05/18 0830  BP: 101/72 (!) 92/56 (!) 88/53 (!) 82/54  Pulse: 90 78 79 85  Resp:  18 18   Temp:  98.8 F (37.1 C) 98.3 F (36.8 C)   TempSrc:  Oral Oral   SpO2:  99% 98%   Weight:      Height:        Intake/Output Summary (Last 24 hours) at 07/05/2018 1043 Last data filed at 07/05/2018 0533 Gross per 24 hour  Intake 460 ml  Output 1400 ml  Net -940 ml   Filed Weights   07/01/18 1251 07/03/18 0500  Weight: 70.1 kg 74.3 kg  Examination: General exam: Appears calm and comfortable  Respiratory system: Clear to auscultation anteriorly, diminished bases.  On room air. Respiratory effort normal. Cardiovascular system: S1 & S2 heard. No JVD, murmurs, rubs, gallops or clicks.  Bilateral pitting pedal edema dependent edema as well Gastrointestinal system: Abdomen is nondistended, soft and nontender. No organomegaly or masses felt. Normal bowel  sounds heard. Central nervous system: Alert and oriented. No focal neurological deficits. Extremities: Symmetric  Skin: No rashes, lesions or ulcers on exposed skin Psychiatry: Judgement and insight appear normal. Mood & affect appropriate.    Data Reviewed: I have personally reviewed following labs and imaging studies  CBC: Recent Labs  Lab 07/01/18 1249 07/02/18 0250 07/03/18 0414  WBC 7.2 6.6 7.5  NEUTROABS 5.9  --   --   HGB 8.4* 7.5* 8.2*  HCT 28.3* 25.1* 25.7*  MCV 101.4* 98.0 98.5  PLT 245 208 161   Basic Metabolic Panel: Recent Labs  Lab 07/01/18 1249 07/02/18 0250 07/03/18 0414 07/04/18 0135 07/05/18 0638  NA 137 139 139 137 138  K 5.1 4.3 4.0 3.9 3.3*  CL 109 109 108 105 107  CO2 19* 21* 21* 20* 23  GLUCOSE 80 70 62* 81 85  BUN 61* 59* 57* 56* 51*  CREATININE 1.87* 2.00* 2.16* 2.02* 1.99*  CALCIUM 7.9* 7.9* 7.9* 8.0* 8.4*  PHOS  --   --  4.7* 4.4 4.1   GFR: Estimated Creatinine Clearance: 18.4 mL/min (A) (by C-G formula based on SCr of 1.99 mg/dL (H)). Liver Function Tests: Recent Labs  Lab 07/01/18 1249 07/03/18 0414 07/04/18 0135 07/05/18 0638  AST 39  --   --   --   ALT 25  --   --   --   ALKPHOS 110  --   --   --   BILITOT 0.5  --   --   --   PROT 5.4*  --   --   --   ALBUMIN 2.2* 2.0* 2.0* 2.1*   No results for input(s): LIPASE, AMYLASE in the last 168 hours. No results for input(s): AMMONIA in the last 168 hours. Coagulation Profile: No results for input(s): INR, PROTIME in the last 168 hours. Cardiac Enzymes: Recent Labs  Lab 07/01/18 1249  TROPONINI <0.03   BNP (last 3 results) No results for input(s): PROBNP in the last 8760 hours. HbA1C: No results for input(s): HGBA1C in the last 72 hours. CBG: Recent Labs  Lab 07/04/18 0825 07/04/18 1207 07/04/18 1717 07/04/18 2051 07/05/18 0759  GLUCAP 99 78 126* 142* 123*   Lipid Profile: No results for input(s): CHOL, HDL, LDLCALC, TRIG, CHOLHDL, LDLDIRECT in the last 72  hours. Thyroid Function Tests: No results for input(s): TSH, T4TOTAL, FREET4, T3FREE, THYROIDAB in the last 72 hours. Anemia Panel: No results for input(s): VITAMINB12, FOLATE, FERRITIN, TIBC, IRON, RETICCTPCT in the last 72 hours. Sepsis Labs: Recent Labs  Lab 07/01/18 1533 07/01/18 1712  LATICACIDVEN 0.67 0.54    Recent Results (from the past 240 hour(s))  Blood culture (routine x 2)     Status: None (Preliminary result)   Collection Time: 07/01/18  3:20 PM  Result Value Ref Range Status   Specimen Description BLOOD RIGHT ANTECUBITAL  Final   Special Requests   Final    BOTTLES DRAWN AEROBIC AND ANAEROBIC Blood Culture adequate volume   Culture   Final    NO GROWTH 3 DAYS Performed at [facility], [address] [place], [place] [address]  Report Status PENDING  Incomplete  Blood culture (routine x 2)     Status: None (Preliminary result)   Collection Time: 07/01/18  3:28 PM  Result Value Ref Range Status   Specimen Description BLOOD RIGHT ARM  Final   Special Requests   Final    BOTTLES DRAWN AEROBIC AND ANAEROBIC Blood Culture adequate volume   Culture   Final    NO GROWTH 3 DAYS Performed at [facility], [address] [place], [place] [address]    Report Status PENDING  Incomplete  MRSA PCR Screening     Status: None   Collection Time: 07/03/18  8:15 AM  Result Value Ref Range Status   MRSA by PCR NEGATIVE NEGATIVE Final    Comment:        The GeneXpert MRSA Assay (FDA approved for NASAL specimens only), is one component of a comprehensive MRSA colonization surveillance program. It is not intended to diagnose MRSA infection nor to guide or monitor treatment for MRSA infections. Performed at [facility], [place] [address] [place],  [address]        Radiology Studies: No results found.    Scheduled Meds: . allopurinol  100 mg Oral BID  . amiodarone  200 mg Oral Daily  . darbepoetin (ARANESP) injection - NON-DIALYSIS   200 mcg Subcutaneous Q Sat-1800  . fluticasone  1 spray Each Nare Daily  . gabapentin  100 mg Oral QHS  . insulin aspart  0-9 Units Subcutaneous TID WC  . levothyroxine  100 mcg Oral QAC breakfast  . loratadine  10 mg Oral Daily  . Melatonin  3 mg Oral QHS  . midodrine  10 mg Oral TID WC  . pantoprazole  40 mg Oral BID  . senna-docusate  1 tablet Oral BID  . simvastatin  20 mg Oral Daily  . torsemide  20 mg Oral BID  . traZODone  100 mg Oral QHS  . venlafaxine XR  75 mg Oral Daily   Continuous Infusions: . ferric gluconate (FERRLECIT/NULECIT) IV 125 mg (07/05/18 1010)     LOS: 4 days    Time spent: 20 minutes   Dessa Phi, DO [facility] [email or web address] 07/05/2018, 10:43 AM

## 2018-07-05 NOTE — Progress Notes (Signed)
Patient ID: Madison Riggs, female   DOB: 08/05/1927, 85 y.o.   MRN: [id number] [facility] Progress Note   Assessment/ Plan:   1. Acute kidney Injury on chronic kidney disease stage III-IV: Likely secondary to diuretic use/activation of RAS axis.  Urine output appears to be satisfactory overnight with stable renal function.  We will convert her to oral torsemide from intravenous furosemide to try and tailor an outpatient regimen that she can be discharged on in the near future.  Her advanced age and functional limitations would be deleterious to quality of life if started on dialysis. 2.  Anasarca: This is complicated by hypoalbuminemia, diastolic heart failure and chronic kidney disease limiting aggression with diuretics.  Convert to oral torsemide. 3.  Hypotension: Continue midodrine with low-dose diuretics.  Her hypotension remains limiting to aggression of diuretic use and possibly contributory to her renal dysfunction. 4.  Anemia: On intravenous iron, status post darbepoetin.  Without overt loss. 5.  Hypokalemia: Secondary to diuretic induced losses, will replace via oral route. Subjective:   Reports to be feeling fair, sat in recliner earlier today.   Objective:   BP (!) 82/54 (BP Location: Left Arm)   Pulse 85   Temp 98.3 F (36.8 C) (Oral)   Resp 18   Ht 5\' 2"  (1.575 m)   Wt 74.3 kg   SpO2 98%   BMI 29.96 kg/m   Intake/Output Summary (Last 24 hours) at 07/05/2018 0933 Last data filed at 07/05/2018 0533 Gross per 24 hour  Intake 460 ml  Output 1400 ml  Net -940 ml   Weight change:   Physical Exam: Gen: Comfortably resting in bed CVS: Pulse regular rhythm, normal rate, S1 and S2 normal Resp: Fine rales left base otherwise clear to auscultation. Abd: Soft, flat, nontender Ext: 1-2+ edema over lower extremities, asymmetric upper extremity edema right greater than left  Imaging: No results found.  Labs: BMET Recent Labs  Lab 07/01/18 1249  07/02/18 0250 07/03/18 0414 07/04/18 0135 07/05/18 0638  NA 137 139 139 137 138  K 5.1 4.3 4.0 3.9 3.3*  CL 109 109 108 105 107  CO2 19* 21* 21* 20* 23  GLUCOSE 80 70 62* 81 85  BUN 61* 59* 57* 56* 51*  CREATININE 1.87* 2.00* 2.16* 2.02* 1.99*  CALCIUM 7.9* 7.9* 7.9* 8.0* 8.4*  PHOS  --   --  4.7* 4.4 4.1   CBC Recent Labs  Lab 07/01/18 1249 07/02/18 0250 07/03/18 0414  WBC 7.2 6.6 7.5  NEUTROABS 5.9  --   --   HGB 8.4* 7.5* 8.2*  HCT 28.3* 25.1* 25.7*  MCV 101.4* 98.0 98.5  PLT 245 208 211    Medications:    . allopurinol  100 mg Oral BID  . amiodarone  200 mg Oral Daily  . darbepoetin (ARANESP) injection - NON-DIALYSIS  200 mcg Subcutaneous Q Sat-1800  . fluticasone  1 spray Each Nare Daily  . furosemide  40 mg Intravenous Q12H  . gabapentin  100 mg Oral QHS  . insulin aspart  0-9 Units Subcutaneous TID WC  . levothyroxine  100 mcg Oral QAC breakfast  . loratadine  10 mg Oral Daily  . Melatonin  3 mg Oral QHS  . midodrine  10 mg Oral TID WC  . pantoprazole  40 mg Oral BID  . potassium chloride  40 mEq Oral Once  . senna-docusate  1 tablet Oral BID  . simvastatin  20 mg Oral Daily  . traZODone  100 mg Oral QHS  . venlafaxine XR  75 mg Oral Daily   Elmarie Shiley, MD 07/05/2018, 9:33 AM

## 2018-07-06 LAB — RENAL FUNCTION PANEL
Albumin: 2 g/dL — ABNORMAL LOW (ref 3.5–5.0)
Anion gap: 7 (ref 5–15)
BUN: 53 mg/dL — ABNORMAL HIGH (ref 8–23)
CHLORIDE: 107 mmol/L (ref 98–111)
CO2: 23 mmol/L (ref 22–32)
Calcium: 8.3 mg/dL — ABNORMAL LOW (ref 8.9–10.3)
Creatinine, Ser: 2.11 mg/dL — ABNORMAL HIGH (ref 0.44–1.00)
GFR calc Af Amer: 24 mL/min — ABNORMAL LOW (ref >60)
GFR calc non Af Amer: 20 mL/min — ABNORMAL LOW (ref >60)
Glucose, Bld: 80 mg/dL (ref 70–99)
Phosphorus: 3.8 mg/dL (ref 2.5–4.6)
Potassium: 4.1 mmol/L (ref 3.5–5.1)
Sodium: 137 mmol/L (ref 135–145)

## 2018-07-06 LAB — GLUCOSE, CAPILLARY
Glucose-Capillary: 98 mg/dL (ref 70–99)
Glucose-Capillary: 97 mg/dL (ref 70–99)
Glucose-Capillary: 100 mg/dL — ABNORMAL HIGH (ref 70–99)
Glucose-Capillary: 144 mg/dL — ABNORMAL HIGH (ref 70–99)

## 2018-07-06 LAB — CULTURE, BLOOD (ROUTINE X 2)
Culture: NO GROWTH
Culture: NO GROWTH
SPECIAL REQUESTS: ADEQUATE
Special Requests: ADEQUATE

## 2018-07-06 MED ORDER — ADULT MULTIVITAMIN W/MINERALS CH
1.0000 | ORAL_TABLET | Freq: Every day | ORAL | Status: DC
  Administered 2018-07-06 – 2018-07-07 (×2): 1 via ORAL
  Filled 2018-07-06 (×2): qty 1

## 2018-07-06 NOTE — Progress Notes (Signed)
Patient ID: Madison Riggs, female   DOB: [birth year], 85 y.o.   MRN: [id number] [facility] Progress Note   Assessment/ Plan:   1. Acute kidney Injury on chronic kidney disease stage III-IV: Likely secondary to diuretic use/activation of RAS axis.  Fair urine output with stable/slightly improved creatinine overnight following conversion to oral torsemide.  Her advanced age and functional limitations would be deleterious to quality of life if started on dialysis. 2.  Anasarca: This is complicated by hypoalbuminemia, diastolic heart failure and chronic kidney disease limiting aggression with diuretics.  Today converted to oral torsemide 3.  Hypotension: Continue midodrine with low-dose diuretics.  Her low blood pressure limits up titration of diuretics for fear of worsening renal function. 4.  Anemia: On intravenous iron, status post darbepoetin.  Without overt loss. 5.  Hypokalemia: Secondary to diuretic induced losses, replaced via oral route.  Will sign off at this time.  Please call with questions or concerns.  To continue follow-up with Dr. Moshe Cipro as an outpatient.  Subjective:   She reports to be breathing well without any difficulty and denies any chest pain.   Objective:   BP (!) 98/53 (BP Location: Right Arm)   Pulse 80   Temp 98.4 F (36.9 C) (Oral)   Resp 18   Ht 5\' 2"  (1.575 m)   Wt 74.3 kg   SpO2 98%   BMI 29.96 kg/m   Intake/Output Summary (Last 24 hours) at 07/06/2018 1125 Last data filed at 07/05/2018 2330 Gross per 24 hour  Intake 360 ml  Output 900 ml  Net -540 ml   Weight change:   Physical Exam: Gen: Comfortably resting in recliner CVS: Pulse regular rhythm, normal rate, S1 and S2 normal Resp: Fine rales left base otherwise clear to auscultation. Abd: Soft, flat, nontender Ext: 2+ edema over lower extremities, asymmetric upper extremity edema right greater than left  Imaging: No results found.  Labs: BMET Recent Labs  Lab  07/01/18 1249 07/02/18 0250 07/03/18 0414 07/04/18 0135 07/05/18 0638 07/06/18 0228  NA 137 139 139 137 138 137  K 5.1 4.3 4.0 3.9 3.3* 4.1  CL 109 109 108 105 107 107  CO2 19* 21* 21* 20* 23 23  GLUCOSE 80 70 62* 81 85 80  BUN 61* 59* 57* 56* 51* 53*  CREATININE 1.87* 2.00* 2.16* 2.02* 1.99* 2.11*  CALCIUM 7.9* 7.9* 7.9* 8.0* 8.4* 8.3*  PHOS  --   --  4.7* 4.4 4.1 3.8   CBC Recent Labs  Lab 07/01/18 1249 07/02/18 0250 07/03/18 0414  WBC 7.2 6.6 7.5  NEUTROABS 5.9  --   --   HGB 8.4* 7.5* 8.2*  HCT 28.3* 25.1* 25.7*  MCV 101.4* 98.0 98.5  PLT 245 208 211    Medications:    . allopurinol  100 mg Oral BID  . amiodarone  200 mg Oral Daily  . darbepoetin (ARANESP) injection - NON-DIALYSIS  200 mcg Subcutaneous Q Sat-1800  . fluticasone  1 spray Each Nare Daily  . gabapentin  100 mg Oral QHS  . insulin aspart  0-9 Units Subcutaneous TID WC  . levothyroxine  100 mcg Oral QAC breakfast  . loratadine  10 mg Oral Daily  . Melatonin  3 mg Oral QHS  . midodrine  10 mg Oral TID WC  . pantoprazole  40 mg Oral BID  . senna-docusate  1 tablet Oral BID  . simvastatin  20 mg Oral Daily  . torsemide  20 mg Oral BID  .  traZODone  100 mg Oral QHS  . venlafaxine XR  75 mg Oral Daily   Elmarie Shiley, MD 07/06/2018, 11:25 AM

## 2018-07-06 NOTE — Progress Notes (Signed)
   07/06/18 1355  Vitals  Temp 98.3 F (36.8 C)  Temp Source Oral  BP (!) 78/52  MAP (mmHg) (!) 61  BP Location Left Arm  BP Method Automatic  Patient Position (if appropriate) Lying  Pulse Rate 76  Resp 18  Oxygen Therapy  SpO2 99 %  O2 Device Room Air  MEWS Score  MEWS RR 0  MEWS Pulse 0  MEWS Systolic 2  MEWS LOC 0  MEWS Temp 0  MEWS Score 2  MEWS Score Color Yellow  Dr. Maylene Roes notifed BP. Will continue to monitor

## 2018-07-06 NOTE — Progress Notes (Signed)
Nutrition Follow-up  DOCUMENTATION CODES:   Non-severe (moderate) malnutrition in context of chronic illness  INTERVENTION:   -HS snack daily -MVI with minerals daily  NUTRITION DIAGNOSIS:   Moderate Malnutrition related to chronic illness(CHF, CKD stage IV, dementia) as evidenced by mild muscle depletion, moderate muscle depletion, mild fat depletion, moderate fat depletion.  Ongoing  GOAL:   Patient will meet greater than or equal to 90% of their needs  Progressing  MONITOR:   PO intake, Supplement acceptance, Labs, I & O's, Skin, Weight trends  REASON FOR ASSESSMENT:   Consult Assessment of nutrition requirement/status  ASSESSMENT:   85 year old femalewith PMH significant for OSA on BIPAP, dementia, CHF, h/o gastric bypass, HLD, HTN, DM, and CKD stage IV. Pt presented with LE edema and hypotension. Pt was previously admitted from 12/26-1/6 for new-onset afib with RVR and acute on chronic diastolic CHF. She had ascites and had paracentesis done with 6+L drained. Pt was discharged to SNF. Pt was admitted with anasarca, questionable cardiorenal syndrome.  Reviewed I/O's: -540 ml x 24 hours and -2.5 L since admission   Spoke with pt, who was sitting in recliner chair at time of visit. Pt reports very good appetite, consuming most of her meals. She consumed eggs, bacon, and toast this morning. Noted pt also had low sodium ketchup at bedside. Meal completion 75-100%.   Renal diet provides approximately 1949 kcals and 81 grams of protein, so pt is meeting nutritional needs via meals and snacks.   Nephrology signed off today, as pt is a poor HD candidate. Plan to d/c to SNF with palliative services once medically stable.   Labs reviewed: CBGS: 88-159 (inpatient orders for glycemic control are 0-9 units insulin aspart TID with meals).   Diet Order:   Diet Order            Diet renal with fluid restriction Fluid restriction: 1200 mL Fluid; Room service appropriate? Yes;  Fluid consistency: Thin  Diet effective now              EDUCATION NEEDS:   No education needs have been identified at this time  Skin:  Skin Assessment: Skin Integrity Issues: Skin Integrity Issues:: Stage II Stage II: sacrum  Last BM:  07/03/18  Height:   Ht Readings from Last 1 Encounters:  07/01/18 5\' 2"  (1.575 m)    Weight:   Wt Readings from Last 1 Encounters:  07/03/18 74.3 kg    Ideal Body Weight:  50 kg  BMI:  Body mass index is 29.96 kg/m.  Estimated Nutritional Needs:   Kcal:  1400-1600  Protein:  70-85 grams  Fluid:  1.4-1.6 L    Madison Riggs A. Jimmye Norman, RD, LDN, CDE Pager: [phone] After hours Pager: [phone]

## 2018-07-06 NOTE — Progress Notes (Signed)
PROGRESS NOTE    Madison Riggs  YTK:[id number] DOB: 03/09/1928 DOA: 07/01/2018 PCP: Camille Bal, PA-C     Brief Narrative:  Madison Riggs is a 85 y.o. female with medical history significant of OSA on BIPAP; dementia; diastolic CHF; h/o gastric bypass; hypothyroidism; HLD; HTN; DM; and stage 4 CKD presenting with LE edema and hypotension.  She was previously admitted from 12/26-1/6 for new-onset afib with RVR and acute on chronic diastolic CHF.  She had ascites and had paracentesis done with 6+L drained.  She was discharged to SNF.  She was admitted with anasarca, questionable cardiorenal syndrome.  Nephrology consulted.  Patient was started on IV diuresis, midodrine for hypotension.  New events last 24 hours / Subjective: No new complaints   Assessment & Plan:   Principal Problem:   Anasarca Active Problems:   Iron deficiency anemia   Hypertension   Type II diabetes mellitus with renal manifestations (HCC)   Chronic diastolic HF (heart failure) (HCC)   CKD (chronic kidney disease), stage IV (HCC)   Atrial fibrillation, chronic   HLD (hyperlipidemia)   Hypothyroidism   Pressure injury of skin   Goals of care, counseling/discussion   Palliative care by specialist   DNR (do not resuscitate) discussion   Malnutrition of moderate degree   Anasarca -Volume overloaded, complicated by hypoalbuminemia with marked third spacing, diastolic heart failure, CKD stage IV, concern for cardiorenal syndrome -Has required paracentesis last admission -Appreciate nephrology, patient switched to oral torsemide  Hypotension -Continue midodrine  CKD stage IV -Nephrology following, patient not a candidate for dialysis at this point  Chronic atrial fibrillation -Patient not a candidate for anticoagulation due to frailty, falls, history of GI bleed, anemia -Continue amiodarone   Type 2 diabetes -Diet controlled, continue sliding scale insulin  Hyperlipidemia -Continue  Zocor  Hypothyroidism -Continue Synthroid  Anemia of chronic kidney disease -Monitor, nulecit   Depression -Continue Effexor  History of shingles right-side under breast  -Patient states that she was treated for shingles as an outpatient a few weeks ago.  Currently, her outbreak has scabbed over, itching.  No need for airborne isolation at this time  Stage 2 pressure injury sacrum - POA    DVT prophylaxis: SCD Code Status: DNR Family Communication: No family at bedside Disposition Plan: Hopefully can stabilize on oral regimen so she can be discharged to skilled nursing facility this week.  Overall prognosis very poor, appreciate palliative care team.   Consultants:   Nephrology  Palliative care team  Procedures:   None  Antimicrobials:  Anti-infectives (From admission, onward)   None       Objective: Vitals:   07/05/18 0830 07/05/18 1353 07/05/18 2046 07/06/18 0442  BP: (!) 82/54 (!) 80/48 91/62 (!) 98/53  Pulse: 85 95 68 80  Resp:  16 (!) 22 18  Temp:  98 F (36.7 C) 98.8 F (37.1 C) 98.4 F (36.9 C)  TempSrc:  Oral Oral Oral  SpO2:  99% 97% 98%  Weight:      Height:        Intake/Output Summary (Last 24 hours) at 07/06/2018 1106 Last data filed at 07/05/2018 2330 Gross per 24 hour  Intake 360 ml  Output 900 ml  Net -540 ml   Filed Weights   07/01/18 1251 07/03/18 0500  Weight: 70.1 kg 74.3 kg    Examination: General exam: Appears calm and comfortable  Respiratory system: Clear to auscultation. Respiratory effort normal.  Diminished bibasilar Cardiovascular system: S1 & S2  heard. No JVD, murmurs, rubs, gallops or clicks.  Bilateral pitting pedal edema with dependent pitting edema consistent with anasarca  Gastrointestinal system: Abdomen is nondistended, soft and nontender. No organomegaly or masses felt. Normal bowel sounds heard. Central nervous system: Alert and oriented. No focal neurological deficits. Extremities: Symmetric 5 x 5  power. Skin: No rashes, lesions or ulcers on exposed skin Psychiatry: Judgement and insight appear normal. Mood & affect appropriate.    Data Reviewed: I have personally reviewed following labs and imaging studies  CBC: Recent Labs  Lab 07/01/18 1249 07/02/18 0250 07/03/18 0414  WBC 7.2 6.6 7.5  NEUTROABS 5.9  --   --   HGB 8.4* 7.5* 8.2*  HCT 28.3* 25.1* 25.7*  MCV 101.4* 98.0 98.5  PLT 245 208 299   Basic Metabolic Panel: Recent Labs  Lab 07/02/18 0250 07/03/18 0414 07/04/18 0135 07/05/18 0638 07/06/18 0228  NA 139 139 137 138 137  K 4.3 4.0 3.9 3.3* 4.1  CL 109 108 105 107 107  CO2 21* 21* 20* 23 23  GLUCOSE 70 62* 81 85 80  BUN 59* 57* 56* 51* 53*  CREATININE 2.00* 2.16* 2.02* 1.99* 2.11*  CALCIUM 7.9* 7.9* 8.0* 8.4* 8.3*  PHOS  --  4.7* 4.4 4.1 3.8   GFR: Estimated Creatinine Clearance: 17.4 mL/min (A) (by C-G formula based on SCr of 2.11 mg/dL (H)). Liver Function Tests: Recent Labs  Lab 07/01/18 1249 07/03/18 0414 07/04/18 0135 07/05/18 3716 07/06/18 0228  AST 39  --   --   --   --   ALT 25  --   --   --   --   ALKPHOS 110  --   --   --   --   BILITOT 0.5  --   --   --   --   PROT 5.4*  --   --   --   --   ALBUMIN 2.2* 2.0* 2.0* 2.1* 2.0*   No results for input(s): LIPASE, AMYLASE in the last 168 hours. No results for input(s): AMMONIA in the last 168 hours. Coagulation Profile: No results for input(s): INR, PROTIME in the last 168 hours. Cardiac Enzymes: Recent Labs  Lab 07/01/18 1249  TROPONINI <0.03   BNP (last 3 results) No results for input(s): PROBNP in the last 8760 hours. HbA1C: No results for input(s): HGBA1C in the last 72 hours. CBG: Recent Labs  Lab 07/05/18 0759 07/05/18 1207 07/05/18 1750 07/05/18 2042 07/06/18 0745  GLUCAP 123* 88 159* 88 98   Lipid Profile: No results for input(s): CHOL, HDL, LDLCALC, TRIG, CHOLHDL, LDLDIRECT in the last 72 hours. Thyroid Function Tests: No results for input(s): TSH, T4TOTAL,  FREET4, T3FREE, THYROIDAB in the last 72 hours. Anemia Panel: No results for input(s): VITAMINB12, FOLATE, FERRITIN, TIBC, IRON, RETICCTPCT in the last 72 hours. Sepsis Labs: Recent Labs  Lab 07/01/18 1533 07/01/18 1712  LATICACIDVEN 0.67 0.54    Recent Results (from the past 240 hour(s))  Blood culture (routine x 2)     Status: None   Collection Time: 07/01/18  3:20 PM  Result Value Ref Range Status   Specimen Description BLOOD RIGHT ANTECUBITAL  Final   Special Requests   Final    BOTTLES DRAWN AEROBIC AND ANAEROBIC Blood Culture adequate volume   Culture   Final    NO GROWTH 5 DAYS Performed at [facility], [address], [place], [place] [address]    Report Status 07/06/2018 FINAL  Final  Blood culture (routine x 2)     Status: None   Collection Time: 07/01/18  3:28 PM  Result Value Ref Range Status   Specimen Description BLOOD RIGHT ARM  Final   Special Requests   Final    BOTTLES DRAWN AEROBIC AND ANAEROBIC Blood Culture adequate volume   Culture   Final    NO GROWTH 5 DAYS Performed at [facility], [address], [place], [place] [address]    Report Status 07/06/2018 FINAL  Final  MRSA PCR Screening     Status: None   Collection Time: 07/03/18  8:15 AM  Result Value Ref Range Status   MRSA by PCR NEGATIVE NEGATIVE Final    Comment:        The GeneXpert MRSA Assay (FDA approved for NASAL specimens only), is one component of a comprehensive MRSA colonization surveillance program. It is not intended to diagnose MRSA infection nor to guide or monitor treatment for MRSA infections. Performed at [facility], [address] [place], [place] [address]        Radiology Studies: No results found.    Scheduled Meds: . allopurinol  100 mg Oral BID  . amiodarone  200 mg Oral Daily  . darbepoetin (ARANESP) injection - NON-DIALYSIS  200 mcg Subcutaneous Q Sat-1800  . fluticasone  1 spray Each Nare Daily  . gabapentin  100 mg Oral QHS  .  insulin aspart  0-9 Units Subcutaneous TID WC  . levothyroxine  100 mcg Oral QAC breakfast  . loratadine  10 mg Oral Daily  . Melatonin  3 mg Oral QHS  . midodrine  10 mg Oral TID WC  . pantoprazole  40 mg Oral BID  . senna-docusate  1 tablet Oral BID  . simvastatin  20 mg Oral Daily  . torsemide  20 mg Oral BID  . traZODone  100 mg Oral QHS  . venlafaxine XR  75 mg Oral Daily   Continuous Infusions:    LOS: 5 days    Time spent: 20 minutes   Dessa Phi, DO [facility] [email or web address] 07/06/2018, 11:06 AM

## 2018-07-06 NOTE — Progress Notes (Signed)
Occupational Therapy Treatment Patient Details Name: Madison Riggs MRN: [id number] DOB: Jan 03, 1928 Today's Date: 07/06/2018    History of present illness 85 y.o. female with medical history significant of OSA on BIPAP; dementia; diastolic CHF; h/o gastric bypass; hypothyroidism; HLD; HTN; DM; and stage 4 CKD presenting with LE edema and hypotension.  Recently discharged to rehab.    OT comments  Pt participated in ADL retraining session today w/ focus on bed mobility, grooming and UB bathing and dressing. Pt is overall Min A for UB bathe/dress and politely declined LB bathing and transfer to chair as she was waiting on breakfast and w/ c/o sacral pain when up in recliner. SNF remains appropriate.   Follow Up Recommendations  SNF;Supervision/Assistance - 24 hour    Equipment Recommendations  Other (comment)(Defer to next venue)    Recommendations for Other Services      Precautions / Restrictions Precautions Precautions: Fall Precaution Comments: watch BP Restrictions Weight Bearing Restrictions: No       Mobility Bed Mobility Overal bed mobility: Needs Assistance Bed Mobility: Supine to Sit;Sit to Supine     Supine to sit: Min assist Sit to supine: Min assist   General bed mobility comments: min A for bringing trunk to upright to come into seated at EOB required minA for management of LE back into bed & to scoot to [facility].  Transfers                      Balance Overall balance assessment: Needs assistance Sitting-balance support: No upper extremity supported;Feet supported Sitting balance-Leahy Scale: Fair Sitting balance - Comments: During UB ADL's sitting up at EOB x26 min                                   ADL either performed or assessed with clinical judgement   ADL Overall ADL's : Needs assistance/impaired Eating/Feeding: Sitting;Set up;Bed level   Grooming: Wash/dry hands;Wash/dry face;Oral care;Sitting;Supervision/safety;Minimal  assistance Grooming Details (indicate cue type and reason): Close supervision in sitting secondary to h/o low BP after set-up Min A to wash back and apply lotion after Upper Body Bathing: Sitting;Supervision/ safety;Set up       Upper Body Dressing : Sitting;Min guard                     General ADL Comments: Pt seen for ADL retraining session to include bed mobility, sitting up at EOB for ADL's (grooming and UB bathing). Pt was overall Min A to wash back and apply lotion, Min guard to apply clean gown secondary to having button gown, however anticipate that pt would be supervision/safety otherwise. Cont to monitor BP.     Vision Baseline Vision/History: Wears glasses Patient Visual Report: No change from baseline     Perception     Praxis      Cognition Arousal/Alertness: Awake/alert Behavior During Therapy: WFL for tasks assessed/performed Overall Cognitive Status: Within Functional Limits for tasks assessed                                          Exercises     Shoulder Instructions       General Comments      Pertinent Vitals/ Pain       Pain Assessment: No/denies pain Pain Score: 0-No pain  Home Living                                          Prior Functioning/Environment              Frequency  Min 2X/week        Progress Toward Goals  OT Goals(current goals can now be found in the care plan section)  Progress towards OT goals: Progressing toward goals  Acute Rehab OT Goals Patient Stated Goal: Get breakfast and reposition in bed  Plan Discharge plan remains appropriate    Co-evaluation                 AM-PAC OT "6 Clicks" Daily Activity     Outcome Measure   Help from another person eating meals?: None Help from another person taking care of personal grooming?: A Little Help from another person toileting, which includes using toliet, bedpan, or urinal?: A Lot Help from another person  bathing (including washing, rinsing, drying)?: A Lot Help from another person to put on and taking off regular upper body clothing?: A Little Help from another person to put on and taking off regular lower body clothing?: A Lot 6 Click Score: 16    End of Session    OT Visit Diagnosis: Pain;Muscle weakness (generalized) (M62.81) Pain - Right/Left: (H/o sacrum pain when up in chair per pt report) Pain - part of body: (See above)   Activity Tolerance Patient tolerated treatment well   Patient Left in bed;with call bell/phone within reach;with bed alarm set   Nurse Communication Other (comment)(Pt performed UB ADL's in sitting @ EOB. Pt declined LB ADL's as she wanted breakfast)        Time: 0240-9735 OT Time Calculation (min): 33 min  Charges: OT General Charges $OT Visit: 1 Visit OT Treatments $Self Care/Home Management : 23-37 mins   [place], Amy Beth Dixon, OTR/L 07/06/2018, 9:04 AM

## 2018-07-06 NOTE — Progress Notes (Signed)
Physical Therapy Treatment Patient Details Name: Madison Riggs MRN: [id number] DOB: November 14, 1927 Today's Date: 07/06/2018    History of Present Illness 85 y.o. female with medical history significant of OSA on BIPAP; dementia; diastolic CHF; h/o gastric bypass; hypothyroidism; HLD; HTN; DM; and stage 4 CKD presenting with LE edema and hypotension.  Recently discharged to rehab.     PT Comments    Continuing work on functional mobility and activity tolerance;  Able to incr distance today, with seated rest break to rollator seat; she tells me she is more used to using the Rollator RW at home, so we trialed the  Rollator with some success (incr distance) here; continues to be appropriate for SNF  Follow Up Recommendations  SNF     Equipment Recommendations  None recommended by PT    Recommendations for Other Services       Precautions / Restrictions Precautions Precautions: Fall Precaution Comments: watch BP Required Braces or Orthoses: Other Brace Other Brace: Binder Restrictions Weight Bearing Restrictions: No    Mobility  Bed Mobility Overal bed mobility: Needs Assistance Bed Mobility: Supine to Sit;Sit to Supine     Supine to sit: Min assist Sit to supine: Min assist   General bed mobility comments: min A for bringing trunk to upright to come into seated at EOB required minA for management of LE back into bed & to scoot to [facility].  Transfers Overall transfer level: Needs assistance Equipment used: Rolling walker (2 wheeled) Transfers: Sit to/from Stand Sit to Stand: Mod assist         General transfer comment: modA for initial sit>stand from bed surface, and to stand from rollator seat  Ambulation/Gait Ambulation/Gait assistance: Min assist Gait Distance (Feet): 30 Feet(20, seated rest break, then 10) Assistive device: 4-wheeled walker Gait Pattern/deviations: Step-through pattern;Decreased stride length;Trunk flexed Gait velocity: slowed   General Gait  Details: Min assist to steady and guard; palpable and audible crepitus bil knees and hips   Stairs             Wheelchair Mobility    Modified Rankin (Stroke Patients Only)       Balance Overall balance assessment: Needs assistance Sitting-balance support: No upper extremity supported;Feet supported Sitting balance-Leahy Scale: Fair Sitting balance - Comments: During UB ADL's sitting up at EOB x26 min     Standing balance-Leahy Scale: Poor Standing balance comment: requires UE support for steadying                            Cognition Arousal/Alertness: Awake/alert Behavior During Therapy: WFL for tasks assessed/performed Overall Cognitive Status: Within Functional Limits for tasks assessed                                        Exercises      General Comments General comments (skin integrity, edema, etc.): Did not report dizziness with amb      Pertinent Vitals/Pain Pain Assessment: Faces Pain Score: 0-No pain Faces Pain Scale: Hurts little more Pain Location: bil knees; audible and palpable crepitus Pain Descriptors / Indicators: Aching(Arthritis pain) Pain Intervention(s): Monitored during session    Home Living                      Prior Function            PT Goals (current  goals can now be found in the care plan section) Acute Rehab PT Goals Patient Stated Goal: Wants her knees to move better PT Goal Formulation: With patient Time For Goal Achievement: 07/16/18 Potential to Achieve Goals: Good Progress towards PT goals: Progressing toward goals    Frequency    Min 2X/week      PT Plan Current plan remains appropriate    Co-evaluation              AM-PAC PT "6 Clicks" Mobility   Outcome Measure  Help needed turning from your back to your side while in a flat bed without using bedrails?: A Little Help needed moving from lying on your back to sitting on the side of a flat bed without using  bedrails?: A Little Help needed moving to and from a bed to a chair (including a wheelchair)?: A Little Help needed standing up from a chair using your arms (e.g., wheelchair or bedside chair)?: A Lot Help needed to walk in hospital room?: A Little Help needed climbing 3-5 steps with a railing? : A Lot 6 Click Score: 16    End of Session Equipment Utilized During Treatment: Gait belt Activity Tolerance: Patient tolerated treatment well;Treatment limited secondary to medical complications (Comment) Patient left: with call bell/phone within reach;in chair;Other (comment)(Mobility tech assisting) Nurse Communication: Mobility status PT Visit Diagnosis: Difficulty in walking, not elsewhere classified (R26.2)     Time: 1017-5102 PT Time Calculation (min) (ACUTE ONLY): 13 min  Charges:  $Gait Training: 8-22 mins                     Roney Marion, PT  Acute Rehabilitation Services Pager [phone] Office [place] 07/06/2018, 10:20 AM

## 2018-07-06 NOTE — Social Work (Signed)
4:30pm- CSW faxed referral to [facility] at [phone], will f/u with Luellen Pucker in admissions in the morning regarding pt referral, she is anticipating clinicals.  1:44pm- Spoke with pt son, he understands that pt is not discharging today, he has a call into [facility] and requests I also send referral to them, if they are unable to take pt he understands that pt is approved and can return to [facility]   1:10pm- call placed to Dr. Maylene Roes, pt still volume overloaded and unable to discharge today. Called pt son back with update that pt is NOT discharging today  1:05pm-Spoke with pt son [name] via telephone and let him know that we had insurance approval for pt to discharge back to [facility] today. Pt son concerned about pt leaving today, stated I would follow up with Dr. Maylene Roes.   Westley Hummer, MSW, [place] Work [phone]

## 2018-07-06 NOTE — Progress Notes (Signed)
Palliative:  I met again today at Ms. Ivonne Andrew bedside. She is sitting up in bed and doing word puzzles. She is on room air and with no distress. Abd appears a little smaller. I discussed with Ms. Lumadue her thoughts and fears with her health situation. She says "I am not worried; I just wish we could figure it out." I explained that the struggle is that her heart failure and renal failure are no reversible but we are hoping to try and balance management of fluid and renal function to optimize her quality and quantity of life - to help her live as good as she can for as long as she can. She tells me that she is not afraid to die. Emotional support provided.   I also spoke again with son, [name], via telephone. Discussed progress and likelihood of discharge in the next 1-2 days. [name] is still working to find long term placement for his mother (looking into [facility]). He agrees to transition back to [facility] to complete rehab while he continues to work on placement options. All questions/concerns addressed. Emotional support provided.   Exam: Alert, oriented. No distress. Room air. 3+ BLE edema. Abd edema, generalized anasarca.   Plan: - Transition to SNF rehab with outpatient palliative to follow.  - Recommend outpatient palliative in d/c summary. Anticipate need for outpatient hospice in the near future.  - CLEAR INSTRUCTIONS needed for BP parameters and expectations in d/c summary or patient likely to be sent back to hospital with asymptomatic hypotension (discussed with son as well).   31 min  Vinie Sill, NP Palliative Medicine Team Pager # [phone] (M-F 8a-5p) Team Phone # [phone] (Nights/Weekends)

## 2018-07-07 DIAGNOSIS — E1122 Type 2 diabetes mellitus with diabetic chronic kidney disease: Secondary | ICD-10-CM

## 2018-07-07 DIAGNOSIS — I10 Essential (primary) hypertension: Secondary | ICD-10-CM

## 2018-07-07 DIAGNOSIS — I482 Chronic atrial fibrillation, unspecified: Secondary | ICD-10-CM

## 2018-07-07 DIAGNOSIS — E440 Moderate protein-calorie malnutrition: Secondary | ICD-10-CM

## 2018-07-07 DIAGNOSIS — D508 Other iron deficiency anemias: Secondary | ICD-10-CM

## 2018-07-07 DIAGNOSIS — I1310 Hypertensive heart and chronic kidney disease without heart failure, with stage 1 through stage 4 chronic kidney disease, or unspecified chronic kidney disease: Secondary | ICD-10-CM

## 2018-07-07 DIAGNOSIS — E039 Hypothyroidism, unspecified: Secondary | ICD-10-CM

## 2018-07-07 DIAGNOSIS — Z794 Long term (current) use of insulin: Secondary | ICD-10-CM

## 2018-07-07 LAB — RENAL FUNCTION PANEL
Albumin: 2 g/dL — ABNORMAL LOW (ref 3.5–5.0)
Anion gap: 7 (ref 5–15)
BUN: 53 mg/dL — ABNORMAL HIGH (ref 8–23)
CO2: 22 mmol/L (ref 22–32)
Calcium: 8.2 mg/dL — ABNORMAL LOW (ref 8.9–10.3)
Chloride: 107 mmol/L (ref 98–111)
Creatinine, Ser: 1.87 mg/dL — ABNORMAL HIGH (ref 0.44–1.00)
GFR calc Af Amer: 27 mL/min — ABNORMAL LOW (ref >60)
GFR calc non Af Amer: 24 mL/min — ABNORMAL LOW (ref >60)
GLUCOSE: 96 mg/dL (ref 70–99)
Phosphorus: 3.8 mg/dL (ref 2.5–4.6)
Potassium: 4.3 mmol/L (ref 3.5–5.1)
Sodium: 136 mmol/L (ref 135–145)

## 2018-07-07 LAB — GLUCOSE, CAPILLARY
Glucose-Capillary: 83 mg/dL (ref 70–99)
Glucose-Capillary: 97 mg/dL (ref 70–99)

## 2018-07-07 MED ORDER — GABAPENTIN 100 MG PO CAPS: 100.0000 mg | ORAL_CAPSULE | Freq: Every day | ORAL | Status: AC

## 2018-07-07 MED ORDER — TORSEMIDE 20 MG PO TABS: 20.0000 mg | ORAL_TABLET | Freq: Two times a day (BID) | ORAL | Status: AC

## 2018-07-07 MED ORDER — AMIODARONE HCL 200 MG PO TABS: 200.0000 mg | ORAL_TABLET | Freq: Every day | ORAL | Status: AC

## 2018-07-07 MED ORDER — CALCIUM CARBONATE ANTACID 1250 MG/5ML PO SUSP: 500.0000 mg | Freq: Four times a day (QID) | ORAL | Status: AC | PRN

## 2018-07-07 MED ORDER — MIDODRINE HCL 10 MG PO TABS: 10.0000 mg | ORAL_TABLET | Freq: Three times a day (TID) | ORAL | Status: AC

## 2018-07-07 MED ORDER — NEPRO/CARBSTEADY PO LIQD: 237.0000 mL | Freq: Three times a day (TID) | ORAL | 0 refills | Status: AC | PRN

## 2018-07-07 NOTE — Social Work (Addendum)
11:37am- CSW spoke with pt son [name] we discussed placement and [employer]. He is in agreement for pt to return to [facility] where she has been approved for continued rehab. MD contacted and informed that pt is approved and ready for discharge to [facility] when clinically appropriate.  10:33am- [facility] has clinically declined pt, authorization still approved for [facility]. [place] will update CSW when pt bed available.  MD has called son and let him know that pt is clinically stable for discharge. Will call and update son once I have heard from [facility].   Westley Hummer, MSW, [employer] Work [phone]

## 2018-07-07 NOTE — Progress Notes (Signed)
Laverle Patter to be D/C'd  per MD order. Discussed with the patient and all questions fully answered.  VSS, Skin clean, dry and intact. Stage 2 pressure injury noted on sacrum and skin tears noted on arms which were present when pt. Arrived to hospital.   IV catheter discontinued intact. Site without signs and symptoms of complications. Dressing and pressure applied.  An After Visit Summary was printed and given to the patient. Patient received prescription.  D/c education completed with patient/family including follow up instructions, medication list, d/c activities limitations if indicated, with other d/c instructions as indicated by MD - patient able to verbalize understanding, all questions fully answered.   Patient instructed to return to ED, call 911, or call MD for any changes in condition.   Patient to be escorted via [facility] to [facility].  Report given to Caryl Asp, Therapist, sports at [facility].

## 2018-07-07 NOTE — Discharge Summary (Signed)
Physician Discharge Summary  Madison Riggs XHB:[id number] DOB: 06-Mar-1928 DOA: 07/01/2018  PCP: Camille Bal, PA-C  Admit date: 07/01/2018 Discharge date: 07/07/2018  Admitted From: SNF Disposition:  SNF   Recommendations for Outpatient Follow-up:  1. Please order outpatient Palliative Care to follow at [facility] 2. Please obtain BMP/CBC in 5 days  3. The blood pressure is expected to be in the range systolic BP 85 to 95 mmHg; If patient's BP is in range 80-100/40-60 mmHg, and mentation is at baseline, no further work up necessary 4. Please weigh daily and adjust torsemide for >5 lbs weight gain from discharge or hypoxia 5. Please monitor skin regularly for pressure ulcers due to anasarca      Home Health: N/A  Equipment/Devices: Walker  Discharge Condition: Poor, declining  CODE STATUS: DO NOT RESUSCITATE Diet recommendation: Diabetic, cardiac  Brief/Interim Summary: Madison Riggs is a 85 y.o.F with diastolic CHF; OSA on BIPAP; h/o gastric bypass; hypothyroidism; HTN; DM; and stage 4 CKD presenting with LE edema and hypotension.  She was previously admitted from 12/26-1/6 for new-onset afib with RVR and acute on chronic diastolic CHF. She had ascites and had paracentesis done with 6+L drained. She was discharged to SNF.  After discharge, her swelling returned and she was re-admitted with anasarca, ?cardiorenal syndrome.  Nephrology were consulted.  Patient was started on IV diuresis, midodrine for hypotension.        PRINCIPAL HOSPITAL DIAGNOSIS: Anasarca, multifactorial    Discharge Diagnoses:  Anasarca This is second to hypoalbuminemia, diastolic heart failure, CKD.  Patient has had marked third spacing, anasarca since her admission in December, which has been resistant to diuretics as a result of hypotension and worsening renal function.    During this hospitalization, she had IV diuresis again limited by hypotension and renal function.  Diuretics  were held, swelling worsened.  Yesterday, oral torsemide was resumed at dose lower than prior to admission, and patient's swelling is stable, she is able to walk to bathroom now, creatinine stable.  With this dose of torsemide, we will discharge with daily weights, close follow up renal function in 5 days, and titration up or down pending weights and repeat Cr.     Hypotension Midodrine increased during this hospitalization.  Blood pressure ranges from 81-01 systolic.  Torsemide should not be held for blood pressure in the range of 75-10 systolic.  Any blood pressure <80 mmHg or any hypotension with change in mentation should prompt further work up.  CKD stage IV Patient not candidate for dialysis.  Her renal function has progressed due to diuretic use, activation of RAS, early cardiorenal syndrome.  Here, UOP good with torsemide, and swelling stable. -Outpatient palliative care referral recommended  Chronic atrial fibrillation Patient not on anticoagulation due to frailty, falls, history of GI bleed, anemia  Type 2 diabetes  Hypothyroidism  Anemia of chronic kidney disease  Depression  Stage 2 pressure injury sacrum - POA           Discharge Instructions  Discharge Instructions    Diet - low sodium heart healthy   Complete by:  As directed    Discharge instructions   Complete by:  As directed    From Dr. Loleta Books: You were admitted with swelling again. This swelling, as we have discussed, is caused by numerous inter-related factors: low protein/Albumin in the blood (caused by age and undernutrition from heart failure), progressive kidney failure, heart failure, anemia and weakness/deconditioning.   Attempts to correct this so far  have demonstrated time and again, that fixing one problem worsens the others.  We appear to have reached a stable regimen: Take torsemide 20 mg twice daily Take a potassium supplement with it Increase your midodrine to 10 mg three  times daily     Have the nurses at rehab monitor your skin for pressure ulcers daily  Have the nurses at rehab monitor weight daily.  If more than 5 lbs weight gain from baseline, call the doctor or provider on call.  Have the nurses check lab work in 1 week   Increase activity slowly   Complete by:  As directed      Allergies as of 07/07/2018   No Known Allergies     Medication List    TAKE these medications   acetaminophen 325 MG tablet Commonly known as:  TYLENOL Take 2 tablets (650 mg total) by mouth every 6 (six) hours as needed for mild pain (or Fever >/= 101).   allopurinol 100 MG tablet Commonly known as:  ZYLOPRIM Take 100 mg by mouth 2 (two) times daily.   amiodarone 200 MG tablet Commonly known as:  PACERONE Take 1 tablet (200 mg total) by mouth daily. Start taking on:  July 08, 2018 What changed:    how much to take  how to take this  when to take this  additional instructions   bisacodyl 5 MG EC tablet Commonly known as:  DULCOLAX Take 1 tablet (5 mg total) by mouth daily as needed for moderate constipation.   calcium carbonate (dosed in mg elemental calcium) 1250 MG/5ML Susp Take 5 mLs (500 mg of elemental calcium total) by mouth every 6 (six) hours as needed for indigestion.   calcium carbonate 600 MG Tabs tablet Commonly known as:  OS-CAL Take 600 mg by mouth daily.   cetirizine 10 MG tablet Commonly known as:  ZYRTEC Take 10 mg by mouth at bedtime.   diphenhydrAMINE 25 MG tablet Commonly known as:  BENADRYL Take 12.5 mg by mouth every 8 (eight) hours as needed for itching.   feeding supplement (NEPRO CARB STEADY) Liqd Take 237 mLs by mouth 3 (three) times daily as needed (Supplement).   ferrous sulfate 325 (65 FE) MG tablet Take 1 tablet (325 mg total) by mouth daily with breakfast.   fluticasone 50 MCG/ACT nasal spray Commonly known as:  FLONASE Place 1 spray into both nostrils daily.   gabapentin 100 MG capsule Commonly  known as:  NEURONTIN Take 1 capsule (100 mg total) by mouth at bedtime. What changed:  when to take this   Magnesium 400 MG Tabs Take 400 mg by mouth daily.   Melatonin 3 MG Tabs Take 3 mg by mouth at bedtime.   midodrine 10 MG tablet Commonly known as:  PROAMATINE Take 1 tablet (10 mg total) by mouth 3 (three) times daily with meals. What changed:    medication strength  how much to take  when to take this   nystatin powder Generic drug:  nystatin Apply 1 g topically 2 (two) times daily. Apply one application under bilateral breast twice daily until healed.   pantoprazole 40 MG tablet Commonly known as:  PROTONIX Take 1 tablet (40 mg total) by mouth 2 (two) times daily.   potassium chloride SA 20 MEQ tablet Commonly known as:  KLOR-CON M20 Take 1 tablet (20 mEq total) by mouth daily.   RISA-BID PROBIOTIC PO Take 1 tablet by mouth daily.   senna-docusate 8.6-50 MG tablet Commonly known as:  Senokot-S Take 1 tablet by mouth 2 (two) times daily.   simvastatin 40 MG tablet Commonly known as:  ZOCOR Take 20 mg by mouth daily.   SYNTHROID 100 MCG tablet Generic drug:  levothyroxine Take 100 mcg by mouth daily before breakfast.   torsemide 20 MG tablet Commonly known as:  DEMADEX Take 1 tablet (20 mg total) by mouth 2 (two) times daily. What changed:    how much to take  when to take this   traZODone 50 MG tablet Commonly known as:  DESYREL Take 100 mg by mouth at bedtime.   Turmeric 500 MG Tabs Take 500 mg by mouth daily.   venlafaxine XR 75 MG 24 hr capsule Commonly known as:  EFFEXOR-XR Take 75 mg by mouth daily.   Vitamin B-12 5000 MCG Subl Place 0.02 tablets (100 mcg total) under the tongue daily.   WOMENS MULTIVITAMIN PO Take 1 tablet by mouth daily.       No Known Allergies  Consultations:  Nephrology   Procedures/Studies: Dg Chest 2 View  Result Date: 06/09/2018 CLINICAL DATA:  Recent syncopal episode with fall at home EXAM:  CHEST - 2 VIEW COMPARISON:  None. FINDINGS: Cardiac shadow is mildly enlarged. Aortic calcifications are seen. Mild central vascular congestion is noted without interstitial edema. Small right-sided pleural effusion is noted laterally. No focal infiltrate is seen. Degenerative changes of the thoracic spine are noted. IMPRESSION: Mild central vascular congestion with small right-sided pleural effusion. Electronically Signed   By: Inez Catalina M.D.   On: 06/09/2018 23:26   Dg Lumbar Spine Complete  Result Date: 06/09/2018 CLINICAL DATA:  Recent syncopal episode with low back pain, initial encounter EXAM: LUMBAR SPINE - COMPLETE 4+ VIEW COMPARISON:  None. FINDINGS: Vertebral body height is well maintained. Mild osteophytic changes are seen. L5 is partially sacralized. Mild degenerative anterolisthesis of L4 on L5 is noted. No soft tissue abnormality is noted. Aortic calcifications are seen. IMPRESSION: Mild degenerative change without acute abnormality. Electronically Signed   By: Inez Catalina M.D.   On: 06/09/2018 23:50   Ct Head Wo Contrast  Result Date: 06/10/2018 CLINICAL DATA:  Status post syncope and fall. Confusion. Concern for head or cervical spine injury. Initial encounter. EXAM: CT HEAD WITHOUT CONTRAST CT CERVICAL SPINE WITHOUT CONTRAST TECHNIQUE: Multidetector CT imaging of the head and cervical spine was performed following the standard protocol without intravenous contrast. Multiplanar CT image reconstructions of the cervical spine were also generated. COMPARISON:  None. FINDINGS: CT HEAD FINDINGS Brain: No evidence of acute infarction, hemorrhage, hydrocephalus, extra-axial collection or mass lesion / mass effect. Prominence of the ventricles and sulci reflects mild age-appropriate cortical volume loss. Mild periventricular and white matter change likely reflects small vessel ischemic microangiopathy. The brainstem and fourth ventricle are within normal limits. The basal ganglia are  unremarkable in appearance. The cerebral hemispheres demonstrate grossly normal gray-white differentiation. No mass effect or midline shift is seen. Vascular: No hyperdense vessel or unexpected calcification. Skull: There is no evidence of fracture; visualized osseous structures are unremarkable in appearance. Sinuses/Orbits: The visualized portions of the orbits are within normal limits. The paranasal sinuses and mastoid air cells are well-aerated. Other: No significant soft tissue abnormalities are seen. CT CERVICAL SPINE FINDINGS Alignment: Normal. Skull base and vertebrae: No acute fracture. No primary bone lesion or focal pathologic process. Soft tissues and spinal canal: No prevertebral fluid or swelling. No visible canal hematoma. Disc levels: Intervertebral disc spaces are preserved. The bony foramina are grossly unremarkable. Underlying  facet disease is noted. Upper chest: The visualized lung apices are clear. The thyroid gland is only partially characterized but appears grossly unremarkable. Scattered calcification is noted at the carotid bifurcations bilaterally. Other: No additional soft tissue abnormalities are seen. IMPRESSION: 1. No evidence of traumatic intracranial injury or fracture. 2. No evidence of fracture or subluxation along the cervical spine. 3. Mild age-appropriate cortical volume loss and scattered small vessel ischemic microangiopathy. 4. Scattered calcification at the carotid bifurcations bilaterally. Carotid ultrasound would be helpful for further evaluation, when and as deemed clinically appropriate. Electronically Signed   By: Garald Balding M.D.   On: 06/10/2018 00:21   Ct Cervical Spine Wo Contrast  Result Date: 06/10/2018 CLINICAL DATA:  Status post syncope and fall. Confusion. Concern for head or cervical spine injury. Initial encounter. EXAM: CT HEAD WITHOUT CONTRAST CT CERVICAL SPINE WITHOUT CONTRAST TECHNIQUE: Multidetector CT imaging of the head and cervical spine was  performed following the standard protocol without intravenous contrast. Multiplanar CT image reconstructions of the cervical spine were also generated. COMPARISON:  None. FINDINGS: CT HEAD FINDINGS Brain: No evidence of acute infarction, hemorrhage, hydrocephalus, extra-axial collection or mass lesion / mass effect. Prominence of the ventricles and sulci reflects mild age-appropriate cortical volume loss. Mild periventricular and white matter change likely reflects small vessel ischemic microangiopathy. The brainstem and fourth ventricle are within normal limits. The basal ganglia are unremarkable in appearance. The cerebral hemispheres demonstrate grossly normal gray-white differentiation. No mass effect or midline shift is seen. Vascular: No hyperdense vessel or unexpected calcification. Skull: There is no evidence of fracture; visualized osseous structures are unremarkable in appearance. Sinuses/Orbits: The visualized portions of the orbits are within normal limits. The paranasal sinuses and mastoid air cells are well-aerated. Other: No significant soft tissue abnormalities are seen. CT CERVICAL SPINE FINDINGS Alignment: Normal. Skull base and vertebrae: No acute fracture. No primary bone lesion or focal pathologic process. Soft tissues and spinal canal: No prevertebral fluid or swelling. No visible canal hematoma. Disc levels: Intervertebral disc spaces are preserved. The bony foramina are grossly unremarkable. Underlying facet disease is noted. Upper chest: The visualized lung apices are clear. The thyroid gland is only partially characterized but appears grossly unremarkable. Scattered calcification is noted at the carotid bifurcations bilaterally. Other: No additional soft tissue abnormalities are seen. IMPRESSION: 1. No evidence of traumatic intracranial injury or fracture. 2. No evidence of fracture or subluxation along the cervical spine. 3. Mild age-appropriate cortical volume loss and scattered small  vessel ischemic microangiopathy. 4. Scattered calcification at the carotid bifurcations bilaterally. Carotid ultrasound would be helpful for further evaluation, when and as deemed clinically appropriate. Electronically Signed   By: Garald Balding M.D.   On: 06/10/2018 00:21   Dg Chest Port 1 View  Result Date: 07/01/2018 CLINICAL DATA:  Rales. EXAM: PORTABLE CHEST 1 VIEW COMPARISON:  Radiographs of June 09, 2018. FINDINGS: Stable cardiomegaly with central pulmonary vascular congestion. No pneumothorax is noted. Possible minimal bibasilar edema is noted. Minimal right pleural effusion is noted. Bony thorax is unremarkable. IMPRESSION: Stable cardiomegaly with central pulmonary vascular congestion and possible minimal bibasilar edema. Stable minimal right pleural effusion. Electronically Signed   By: Marijo Conception, M.D.   On: 07/01/2018 13:19   Ir Paracentesis  Result Date: 06/17/2018 INDICATION: Patient with history of chronic diastolic HF, CKD stage 4, abdominal distension, and ascites. Request is made for diagnostic and therapeutic paracentesis. EXAM: ULTRASOUND GUIDED DIAGNOSTIC AND THERAPEUTIC PARACENTESIS MEDICATIONS: 10 mL 1% lidocaine COMPLICATIONS: None immediate.  PROCEDURE: Informed written consent was obtained from the patient after a discussion of the risks, benefits and alternatives to treatment. A timeout was performed prior to the initiation of the procedure. Initial ultrasound scanning demonstrates a moderate amount of ascites within the left lower abdominal quadrant. The left lower abdomen was prepped and draped in the usual sterile fashion. 1% lidocaine was used for local anesthesia. Following this, a 19 gauge, 7-cm, Yueh catheter was introduced. An ultrasound image was saved for documentation purposes. The paracentesis was performed. The catheter was removed and a dressing was applied. The patient tolerated the procedure well without immediate post procedural complication. FINDINGS: A  total of approximately 6.2 L of clear gold fluid was removed. Samples were sent to the laboratory as requested by the clinical team. IMPRESSION: Successful ultrasound-guided paracentesis yielding 6.2 L of peritoneal fluid. Read by: Earley Abide, PA-C Electronically Signed   By: Aletta Edouard M.D.   On: 06/17/2018 14:36       Subjective: Feeling stronger today.  Sleepy but alert.  No fever, no dyspnea, chest pain.  No cough. No orthopnea.  Discharge Exam: Vitals:   07/06/18 2044 07/07/18 0531  BP: (!) 88/54 (!) 83/52  Pulse: 80 71  Resp: 16 18  Temp: 98.8 F (37.1 C) (!) 97.5 F (36.4 C)  SpO2: 98% 98%   Vitals:   07/06/18 1355 07/06/18 2044 07/07/18 0500 07/07/18 0531  BP: (!) 78/52 (!) 88/54  (!) 83/52  Pulse: 76 80  71  Resp: 18 16  18   Temp: 98.3 F (36.8 C) 98.8 F (37.1 C)  (!) 97.5 F (36.4 C)  TempSrc: Oral Oral  Oral  SpO2: 99% 98%  98%  Weight:   78 kg   Height:        General: Pt is alert, awake, not in acute distress, sleeping but easily aroused.  Interactive. Cardiovascular: RRR, nl H8-N2, soft systolic ejection murmur.   2+ edema dependently, including to the back, right arm which is dependent Respiratory: Normal respiratory rate and rhythm.  CTAB without rales or wheezes. Abdominal: Abdomen soft and non-tender.  No distension or HSM.   Neuro/Psych: Strength globally very weak but symmetric in upper and lower extremities.  Judgment and insight appear normal, no dementia.   The results of significant diagnostics from this hospitalization (including imaging, microbiology, ancillary and laboratory) are listed below for reference.     Microbiology: Recent Results (from the past 240 hour(s))  Blood culture (routine x 2)     Status: None   Collection Time: 07/01/18  3:20 PM  Result Value Ref Range Status   Specimen Description BLOOD RIGHT ANTECUBITAL  Final   Special Requests   Final    BOTTLES DRAWN AEROBIC AND ANAEROBIC Blood Culture adequate volume    Culture   Final    NO GROWTH 5 DAYS Performed at [facility], [address] [place], [place] [address]    Report Status 07/06/2018 FINAL  Final  Blood culture (routine x 2)     Status: None   Collection Time: 07/01/18  3:28 PM  Result Value Ref Range Status   Specimen Description BLOOD RIGHT ARM  Final   Special Requests   Final    BOTTLES DRAWN AEROBIC AND ANAEROBIC Blood Culture adequate volume   Culture   Final    NO GROWTH 5 DAYS Performed at [facility], [place] [address] [place], [place] [address]    Report Status 07/06/2018 FINAL  Final  MRSA PCR Screening     Status: None   Collection Time: 07/03/18  8:15 AM  Result Value Ref Range Status   MRSA by PCR NEGATIVE NEGATIVE Final    Comment:        The GeneXpert MRSA Assay (FDA approved for NASAL specimens only), is one component of a comprehensive MRSA colonization surveillance program. It is not intended to diagnose MRSA infection nor to guide or monitor treatment for MRSA infections. Performed at [facility], [place] [address], [place], [place] [address]      Labs: BNP (last 3 results) Recent Labs    06/10/18 0203  BNP 462.7*   Basic Metabolic Panel: Recent Labs  Lab 07/03/18 0414 07/04/18 0135 07/05/18 0638 07/06/18 0228 07/07/18 0208  NA 139 137 138 137 136  K 4.0 3.9 3.3* 4.1 4.3  CL 108 105 107 107 107  CO2 21* 20* 23 23 22   GLUCOSE 62* 81 85 80 96  BUN 57* 56* 51* 53* 53*  CREATININE 2.16* 2.02* 1.99* 2.11* 1.87*  CALCIUM 7.9* 8.0* 8.4* 8.3* 8.2*  PHOS 4.7* 4.4 4.1 3.8 3.8   Liver Function Tests: Recent Labs  Lab 07/01/18 1249 07/03/18 0414 07/04/18 0135 07/05/18 0638 07/06/18 0228 07/07/18 0208  AST 39  --   --   --   --   --   ALT 25  --   --   --   --   --   ALKPHOS 110  --   --   --   --   --   BILITOT 0.5  --   --   --   --   --   PROT 5.4*  --   --   --   --   --   ALBUMIN 2.2* 2.0* 2.0* 2.1* 2.0* 2.0*   No results for input(s): LIPASE, AMYLASE in the last  168 hours. No results for input(s): AMMONIA in the last 168 hours. CBC: Recent Labs  Lab 07/01/18 1249 07/02/18 0250 07/03/18 0414  WBC 7.2 6.6 7.5  NEUTROABS 5.9  --   --   HGB 8.4* 7.5* 8.2*  HCT 28.3* 25.1* 25.7*  MCV 101.4* 98.0 98.5  PLT 245 208 211   Cardiac Enzymes: Recent Labs  Lab 07/01/18 1249  TROPONINI <0.03   BNP: Invalid input(s): POCBNP CBG: Recent Labs  Lab 07/06/18 1225 07/06/18 1717 07/06/18 2114 07/07/18 0740 07/07/18 1147  GLUCAP 97 100* 144* 83 97   D-Dimer No results for input(s): DDIMER in the last 72 hours. Hgb A1c No results for input(s): HGBA1C in the last 72 hours. Lipid Profile No results for input(s): CHOL, HDL, LDLCALC, TRIG, CHOLHDL, LDLDIRECT in the last 72 hours. Thyroid function studies No results for input(s): TSH, T4TOTAL, T3FREE, THYROIDAB in the last 72 hours.  Invalid input(s): FREET3 Anemia work up No results for input(s): VITAMINB12, FOLATE, FERRITIN, TIBC, IRON, RETICCTPCT in the last 72 hours. Urinalysis    Component Value Date/Time   COLORURINE YELLOW 07/01/2018 1432   APPEARANCEUR CLEAR 07/01/2018 1432   LABSPEC 1.011 07/01/2018 1432   PHURINE 5.0 07/01/2018 1432   GLUCOSEU NEGATIVE 07/01/2018 1432   HGBUR NEGATIVE 07/01/2018 1432   BILIRUBINUR NEGATIVE 07/01/2018 1432   KETONESUR NEGATIVE 07/01/2018 1432   PROTEINUR NEGATIVE 07/01/2018 1432   NITRITE NEGATIVE 07/01/2018 1432   LEUKOCYTESUR TRACE (A) 07/01/2018 1432   Sepsis Labs Invalid input(s): PROCALCITONIN,  WBC,  LACTICIDVEN Microbiology Recent Results (from the past 240 hour(s))  Blood culture (routine  x 2)     Status: None   Collection Time: 07/01/18  3:20 PM  Result Value Ref Range Status   Specimen Description BLOOD RIGHT ANTECUBITAL  Final   Special Requests   Final    BOTTLES DRAWN AEROBIC AND ANAEROBIC Blood Culture adequate volume   Culture   Final    NO GROWTH 5 DAYS Performed at [facility], [address] [place], [place]  [address]    Report Status 07/06/2018 FINAL  Final  Blood culture (routine x 2)     Status: None   Collection Time: 07/01/18  3:28 PM  Result Value Ref Range Status   Specimen Description BLOOD RIGHT ARM  Final   Special Requests   Final    BOTTLES DRAWN AEROBIC AND ANAEROBIC Blood Culture adequate volume   Culture   Final    NO GROWTH 5 DAYS Performed at [facility], [place] [address], [place], [place] [address]    Report Status 07/06/2018 FINAL  Final  MRSA PCR Screening     Status: None   Collection Time: 07/03/18  8:15 AM  Result Value Ref Range Status   MRSA by PCR NEGATIVE NEGATIVE Final    Comment:        The GeneXpert MRSA Assay (FDA approved for NASAL specimens only), is one component of a comprehensive MRSA colonization surveillance program. It is not intended to diagnose MRSA infection nor to guide or monitor treatment for MRSA infections. Performed at [facility], [place] [address] [place], [place] [address]      Time coordinating discharge: 40 minutes        SIGNED:   Edwin Dada, MD  [facility] 07/07/2018, 11:51 AM

## 2018-07-07 NOTE — Social Work (Signed)
Clinical Social Worker facilitated patient discharge including contacting patient family and facility to confirm patient discharge plans.  Clinical information faxed to facility and family agreeable with plan.  CSW arranged ambulance transport via [facility] to [facility] at 1:45 RN to call [phone] with report prior to discharge.  Clinical Social Worker will sign off for now as social work intervention is no longer needed. Please consult [place] again if new need arises.  Westley Hummer, MSW, [place] Social Worker [phone]

## 2018-07-07 NOTE — Clinical Social Work Placement (Signed)
   CLINICAL SOCIAL WORK PLACEMENT  NOTE [facility]  Date:  07/07/2018  Patient Details  Name: Madison Riggs MRN: [id number] Date of Birth: Oct 25, 1927  Clinical Social Work is seeking post-discharge placement for this patient at the [place] level of care (*CSW will initial, date and re-position this form in  chart as items are completed):  Yes   Patient/family provided with [facility] Work Department's list of facilities offering this level of care within the geographic area requested by the patient (or if unable, by the patient's family).  Yes   Patient/family informed of their freedom to choose among providers that offer the needed level of care, that participate in Medicare, Medicaid or managed care program needed by the patient, have an available bed and are willing to accept the patient.  Yes   Patient/family informed of [facility]'s ownership interest in [facility] and [facility], as well as of the fact that they are under no obligation to receive care at these facilities.  PASRR submitted to EDS on       PASRR number received on       Existing PASRR number confirmed on 07/02/18     FL2 transmitted to all facilities in geographic area requested by pt/family on 07/02/18     FL2 transmitted to all facilities within larger geographic area on       Patient informed that his/her managed care company has contracts with or will negotiate with certain facilities, including the following:        Yes   Patient/family informed of bed offers received.  Patient chooses bed at [facility]     Physician recommends and patient chooses bed at      Patient to be transferred to [facility] on 07/07/18.  Patient to be transferred to facility by [facility]     Patient family notified on 07/07/18 of transfer.  Name of family member notified:  pt son Legrand Como     PHYSICIAN Please prepare priority discharge summary, including  medications, Please prepare prescriptions, Please sign DNR     Additional Comment:    _______________________________________________ Alexander Mt, LCSWA 07/07/2018, 11:50 AM

## 2018-07-08 ENCOUNTER — Ambulatory Visit: Payer: Medicare Other | Admitting: Podiatry

## 2018-07-12 ENCOUNTER — Ambulatory Visit: Payer: Medicare Other | Admitting: Podiatry

## 2018-07-21 ENCOUNTER — Non-Acute Institutional Stay: Payer: Medicare Other | Admitting: Nurse Practitioner

## 2018-07-21 DIAGNOSIS — E440 Moderate protein-calorie malnutrition: Secondary | ICD-10-CM

## 2018-07-21 DIAGNOSIS — F339 Major depressive disorder, recurrent, unspecified: Secondary | ICD-10-CM

## 2018-07-21 DIAGNOSIS — Z515 Encounter for palliative care: Secondary | ICD-10-CM

## 2018-07-21 DIAGNOSIS — R269 Unspecified abnormalities of gait and mobility: Secondary | ICD-10-CM

## 2018-07-21 DIAGNOSIS — I1310 Hypertensive heart and chronic kidney disease without heart failure, with stage 1 through stage 4 chronic kidney disease, or unspecified chronic kidney disease: Secondary | ICD-10-CM

## 2018-07-22 NOTE — Progress Notes (Signed)
[facility] Consult Note Telephone: [phone]  Fax: [phone]  PATIENT NAME: Madison Riggs DOB: 01-Dec-1927 MRN: [id number]  PRIMARY CARE PROVIDER:   Kieth Brightly  REFERRING PROVIDER:  Camille Bal, PA-C [address] [place], [place] [address]  RESPONSIBLE PARTY:   Lesa Vandall (son) [phone] (home) [phone] (work) [phone] (mobile)  HISTORY OF PRESENT ILLNESS:  Madison Riggs is a 85 y.o. year old female with multiple medical problems including chronic diastolic heart failure, atrial fib, anasarca, CKD stage IV, hypotension, protein-calore malnutrition. Palliative Care was asked to help address goals of care.    ASSESSMENT/ PLAN:  -Anasarca/Volume overloadm  2/2 chronic dCHF, hypoproteinemia, stage IV CKD  -Atrial Fibrillation -Protein-calorie malnutrition -gait disturbance -dementia wo behavioral disturbance -followed by Dr. Percival Spanish (cards) -dCHF/fluid balance regimen per cards -daily weights -titrate torsemide -K+ -amiodarone -nephro TID and daily vitamins -uses rolling walker  -continue suprortive care  -continue per cards rec's  -hypotension ( w h/o HTN) -midodrine -hold torsemide for SBP less than 85   -HLD -GERD -hypothyroidism -insomnia -depression -simvastatin -pantoprazole -synthroid -trazadone -venlafaxine   -ACP -DNR on chart  -speak with son about MOST   II spent 60 minutes providing this consultation,  From 10:00  to 11:00. More than 50% of the time in this consultation was spent coordinating communication.    CODE STATUS:  DNR  PPS: 50% HOSPICE ELIGIBILITY/DIAGNOSIS: TBD  PAST MEDICAL HISTORY:  Past Medical History:  Diagnosis Date  . Aortic stenosis    moderate  . CKD (chronic kidney disease) stage 3, GFR 30-59 ml/min (HCC)   . Depression   . DM (diabetes mellitus), type 2 ([place])   . Gout   . HTN (hypertension)   . Hypercholesterolemia   . Hypothyroidism    . Iron deficiency anemia   . Sleep apnea    CPAP  . Syncope 06/10/2018    SOCIAL HX:  Social History   Tobacco Use  . Smoking status: Never Smoker  . Smokeless tobacco: Never Used  Substance Use Topics  . Alcohol use: No    ALLERGIES: No Known Allergies   PERTINENT MEDICATIONS:  Outpatient Encounter Medications as of 07/21/2018  Medication Sig  . acetaminophen (TYLENOL) 325 MG tablet Take 2 tablets (650 mg total) by mouth every 6 (six) hours as needed for mild pain (or Fever >/= 101).  Marland Kitchen allopurinol (ZYLOPRIM) 100 MG tablet Take 100 mg by mouth 2 (two) times daily.   Marland Kitchen amiodarone (PACERONE) 200 MG tablet Take 1 tablet (200 mg total) by mouth daily.  . bisacodyl (DULCOLAX) 5 MG EC tablet Take 1 tablet (5 mg total) by mouth daily as needed for moderate constipation.  . calcium carbonate (OS-CAL) 600 MG TABS tablet Take 600 mg by mouth daily.  . Calcium Carbonate Antacid (CALCIUM CARBONATE, DOSED IN MG ELEMENTAL CALCIUM,) 1250 MG/5ML SUSP Take 5 mLs (500 mg of elemental calcium total) by mouth every 6 (six) hours as needed for indigestion.  . cetirizine (ZYRTEC) 10 MG tablet Take 10 mg by mouth at bedtime.   . Cyanocobalamin (VITAMIN B-12) 5000 MCG SUBL Place 0.02 tablets (100 mcg total) under the tongue daily.  . diphenhydrAMINE (BENADRYL) 25 MG tablet Take 12.5 mg by mouth every 8 (eight) hours as needed for itching.  . ferrous sulfate 325 (65 FE) MG tablet Take 1 tablet (325 mg total) by mouth daily with breakfast.  . fluticasone (FLONASE) 50 MCG/ACT nasal spray Place 1  spray into both nostrils daily.   Marland Kitchen gabapentin (NEURONTIN) 100 MG capsule Take 1 capsule (100 mg total) by mouth at bedtime.  . Magnesium 400 MG TABS Take 400 mg by mouth daily.   . Melatonin 3 MG TABS Take 3 mg by mouth at bedtime.  . midodrine (PROAMATINE) 10 MG tablet Take 1 tablet (10 mg total) by mouth 3 (three) times daily with meals.  . Multiple Vitamins-Minerals (WOMENS MULTIVITAMIN PO) Take 1 tablet by  mouth daily.  . Nutritional Supplements (FEEDING SUPPLEMENT, NEPRO CARB STEADY,) LIQD Take 237 mLs by mouth 3 (three) times daily as needed (Supplement).  . nystatin (NYSTATIN) powder Apply 1 g topically 2 (two) times daily. Apply one application under bilateral breast twice daily until healed.  . pantoprazole (PROTONIX) 40 MG tablet Take 1 tablet (40 mg total) by mouth 2 (two) times daily.  . potassium chloride SA (KLOR-CON M20) 20 MEQ tablet Take 1 tablet (20 mEq total) by mouth daily.  . Probiotic Product (RISA-BID PROBIOTIC PO) Take 1 tablet by mouth daily.  Marland Kitchen senna-docusate (SENOKOT-S) 8.6-50 MG tablet Take 1 tablet by mouth 2 (two) times daily.  . simvastatin (ZOCOR) 40 MG tablet Take 20 mg by mouth daily.  Marland Kitchen SYNTHROID 100 MCG tablet Take 100 mcg by mouth daily before breakfast.   . torsemide (DEMADEX) 20 MG tablet Take 1 tablet (20 mg total) by mouth 2 (two) times daily.  . traZODone (DESYREL) 50 MG tablet Take 100 mg by mouth at bedtime.   . Turmeric 500 MG TABS Take 500 mg by mouth daily.   Marland Kitchen venlafaxine XR (EFFEXOR-XR) 75 MG 24 hr capsule Take 75 mg by mouth daily.   No facility-administered encounter medications on file as of 07/21/2018.     PHYSICAL EXAM:   General: NAD, sitting in recliner participating in exercises Cardiovascular: regular rate and rhythm Pulmonary: clear ant fields Abdomen: soft, nontender, + bowel sounds GU: no suprapubic tenderness Extremities: 4+ bilateral LE edema with weeping, no joint deformities Skin: no rashes of exposed skin Neurological: Weakness but otherwise nonfocal  Jacquita Mulhearn G [place], NP

## 2018-07-29 ENCOUNTER — Telehealth: Payer: Self-pay | Admitting: *Deleted

## 2018-08-01 NOTE — Telephone Encounter (Signed)
Call to speak with pt son to let him know that Dr Percival Spanish cannot write a letter on pt Mental health.

## 2018-08-17 ENCOUNTER — Non-Acute Institutional Stay: Payer: Medicare Other | Admitting: Internal Medicine

## 2018-08-17 DIAGNOSIS — Z515 Encounter for palliative care: Secondary | ICD-10-CM

## 2018-08-18 NOTE — Progress Notes (Signed)
    [facility] Consult Note Telephone: [phone]  Fax: [phone]  PATIENT NAME: Madison Riggs DOB: [birth year] MRN: [id number]  PRIMARY CARE PROVIDER:   Kieth Brightly  REFERRING PROVIDER:  Camille Bal, PA-C [address] [place], [place] [address]  NOTE:  Arrived at [facility] for a Palliative follow-up visit.  Informed by clinical staff that patient is no longer a resident there and no resides at [facility].  Will make attempt to establish Palliative care at new facility.  Gonzella Lex, NP

## 2018-08-19 ENCOUNTER — Encounter (HOSPITAL_BASED_OUTPATIENT_CLINIC_OR_DEPARTMENT_OTHER): Payer: Medicare Other | Attending: Internal Medicine

## 2018-09-14 DEATH — deceased

## 2019-03-09 IMAGING — CT CT CERVICAL SPINE W/O CM
5 of 8 series · 12 of 33 positions shown, 13 images · non-contrast
Comparison: None.

CLINICAL DATA: Status post syncope and fall. Confusion. Concern for
head or cervical spine injury. Initial encounter.

EXAM:
CT HEAD WITHOUT CONTRAST
CT CERVICAL SPINE WITHOUT CONTRAST
TECHNIQUE: Multidetector CT imaging of the head and cervical spine was
performed following the standard protocol without intravenous
contrast. Multiplanar CT image reconstructions of the cervical spine
were also generated.

[Series 5: head bone · axial · 0.40mm/px · z∈[-102,-54]mm · 2 of 74 slices shown]
[im 25/74  bone]
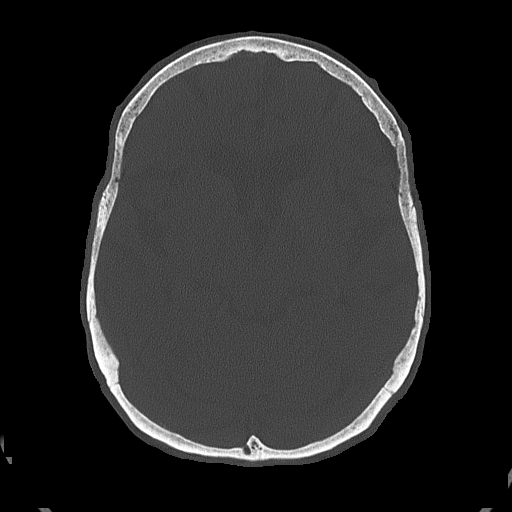
[im 49/74  bone]
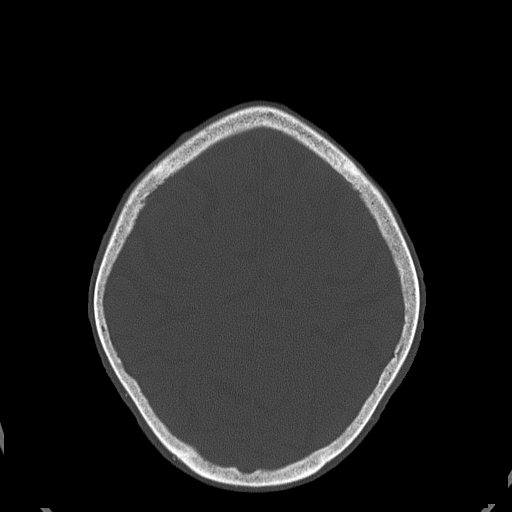

[Series 6: head without cor · coronal · non-contrast · 0.29mm/px · 2 of 65 slices shown]
[im 22/65  bone]
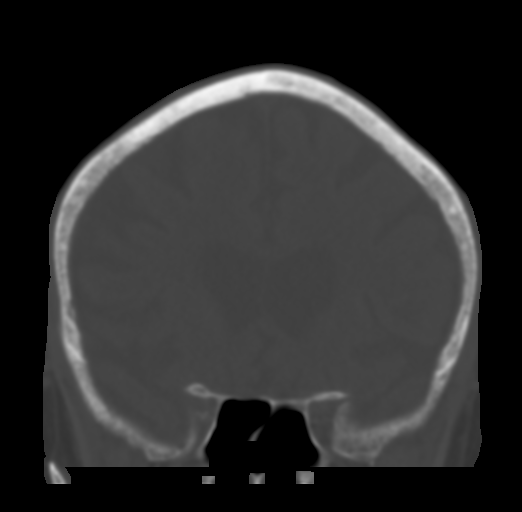
[im 43/65  bone]
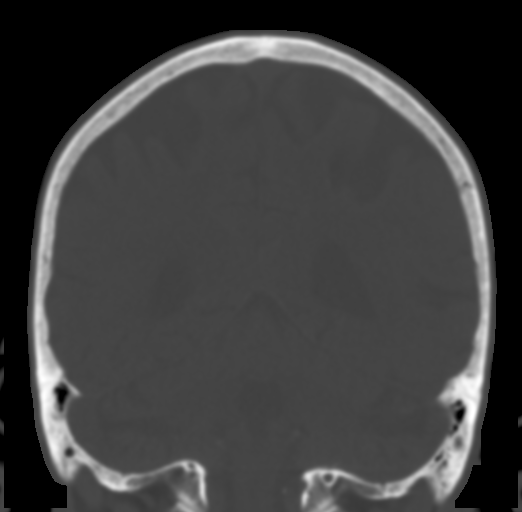

[Series 8: c_spine 2.0 st · axial · 0.21mm/px · z∈[-248,-198]mm · 2 of 75 slices shown, 3 images]
[im 25/75  soft-tissue]
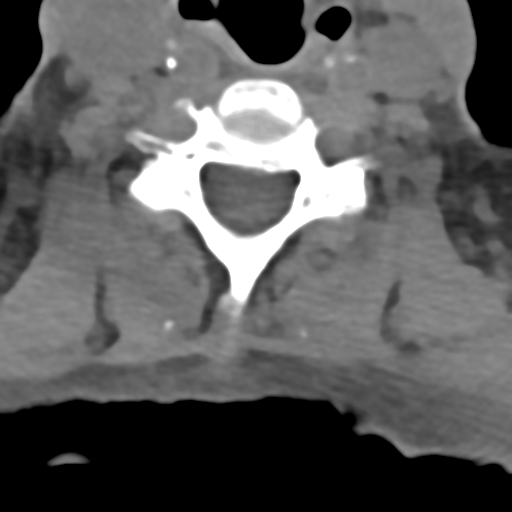
[im 25/75  bone]
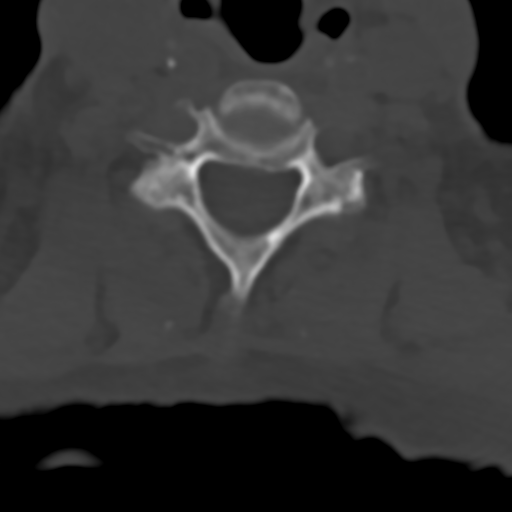
[im 50/75  bone]
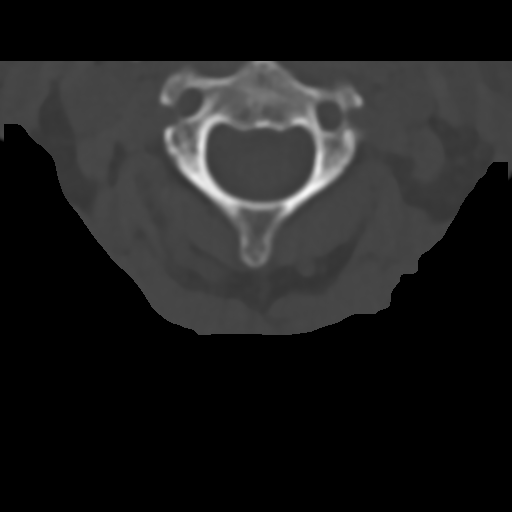

[Series 12: c_spine 2.0 sag bone · sagittal · 0.20mm/px · 4 of 43 slices shown]
[im 9/43  bone]
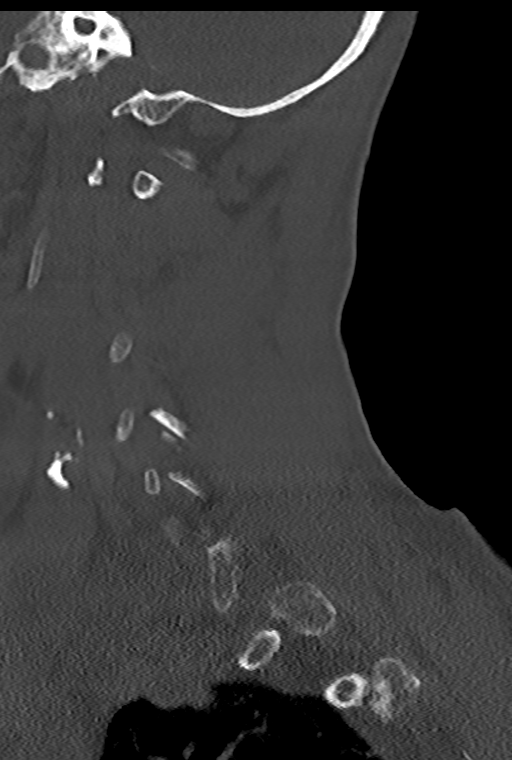
[im 17/43  bone]
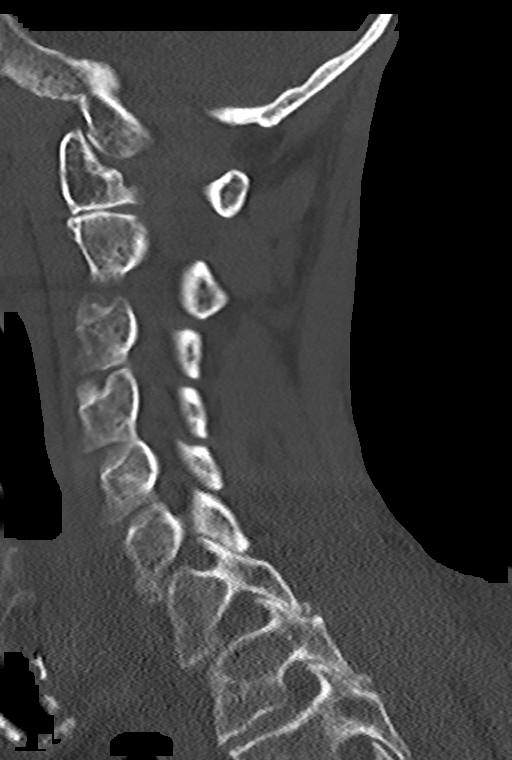
[im 26/43  bone]
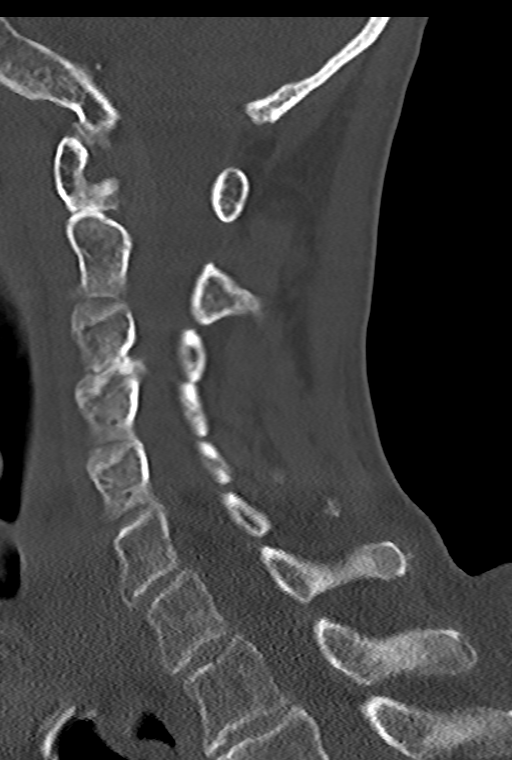
[im 34/43  bone]
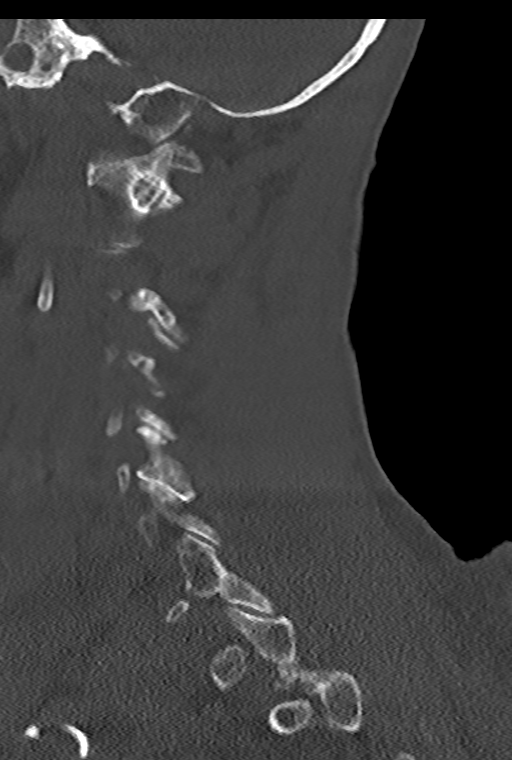

[Series 14: c_spine 2.0 orthogonals · axial · 0.21mm/px · z∈[-265,-215]mm · 2 of 78 slices shown]
[im 26/78  bone]
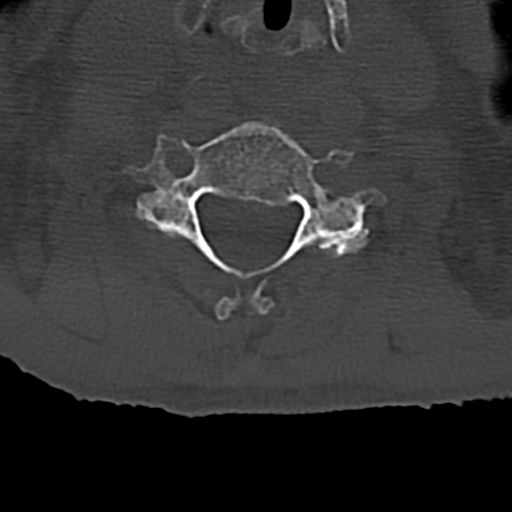
[im 52/78  bone]
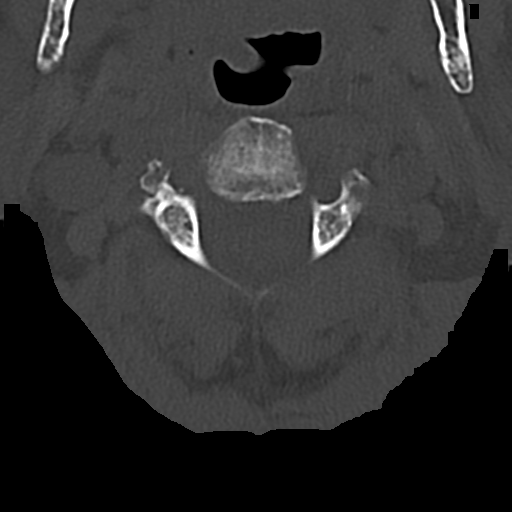

[12 of 33 positions shown; findings below may reference images not displayed]

FINDINGS: CT HEAD FINDINGS

Brain: No evidence of acute infarction, hemorrhage, hydrocephalus,
extra-axial collection or mass lesion / mass effect.

Prominence of the ventricles and sulci reflects mild age-appropriate
cortical volume loss. Mild periventricular and white matter change
likely reflects small vessel ischemic microangiopathy.

The brainstem and fourth ventricle are within normal limits. The
basal ganglia are unremarkable in appearance. The cerebral
hemispheres demonstrate grossly normal gray-white differentiation.
No mass effect or midline shift is seen.

Vascular: No hyperdense vessel or unexpected calcification.

Skull: There is no evidence of fracture; visualized osseous
structures are unremarkable in appearance.

Sinuses/Orbits: The visualized portions of the orbits are within
normal limits. The paranasal sinuses and mastoid air cells are
well-aerated.

Other: No significant soft tissue abnormalities are seen.

CT CERVICAL SPINE FINDINGS

Alignment: Normal.

Skull base and vertebrae: No acute fracture. No primary bone lesion
or focal pathologic process.

Soft tissues and spinal canal: No prevertebral fluid or swelling. No
visible canal hematoma.

Disc levels: Intervertebral disc spaces are preserved. The bony
foramina are grossly unremarkable. Underlying facet disease is
noted.

Upper chest: The visualized lung apices are clear. The thyroid gland
is only partially characterized but appears grossly unremarkable.
Scattered calcification is noted at the carotid bifurcations
bilaterally.

Other: No additional soft tissue abnormalities are seen.
IMPRESSION: 1. No evidence of traumatic intracranial injury or fracture.
2. No evidence of fracture or subluxation along the cervical spine.
3. Mild age-appropriate cortical volume loss and scattered small
vessel ischemic microangiopathy.
4. Scattered calcification at the carotid bifurcations bilaterally.
Carotid ultrasound would be helpful for further evaluation, when and
as deemed clinically appropriate.

## 2019-03-09 IMAGING — CR DG CHEST 2V
2 series · 2 of 2 positions shown · non-contrast
Comparison: None.

CLINICAL DATA: Recent syncopal episode with fall at home

EXAM:
CHEST - 2 VIEW

[chest lat]
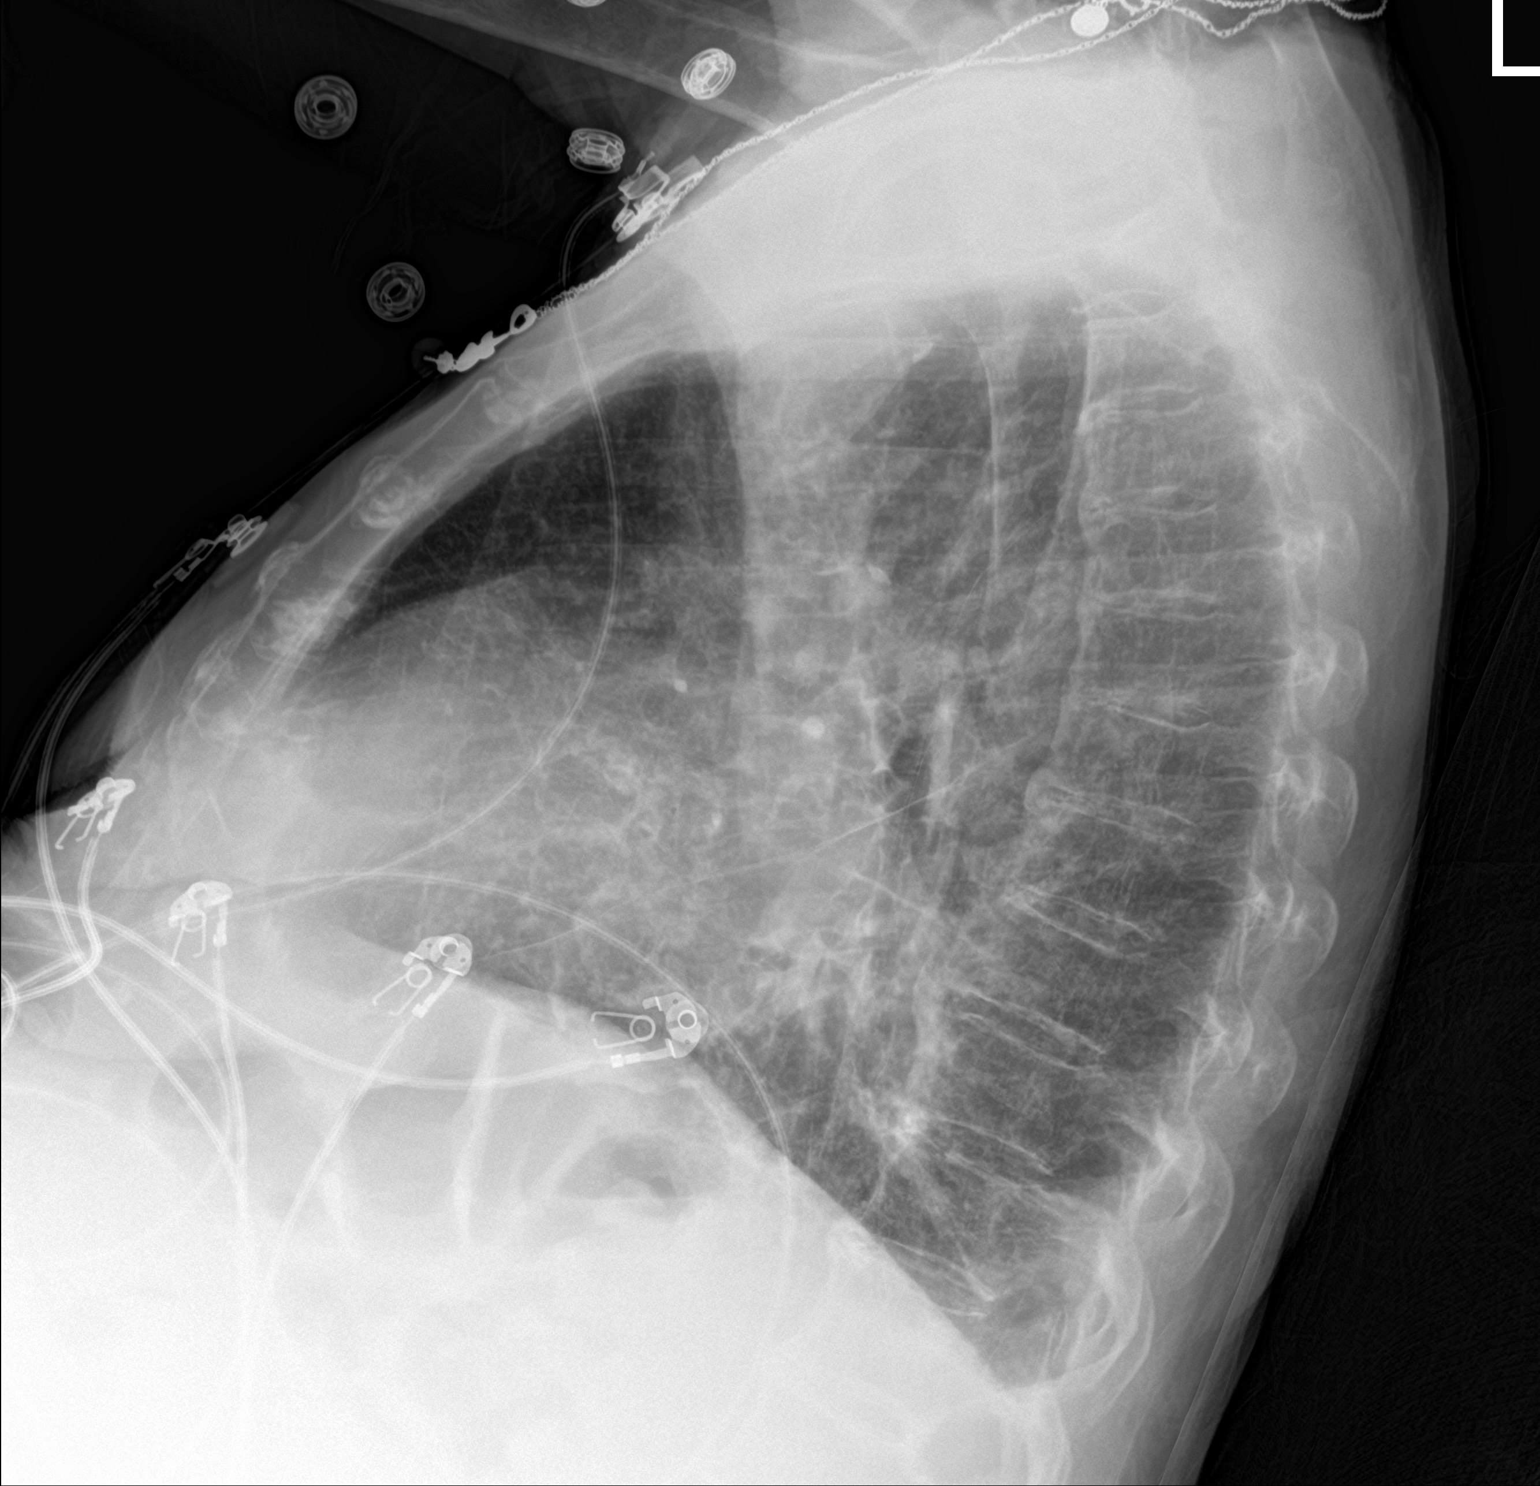

[chest ap]
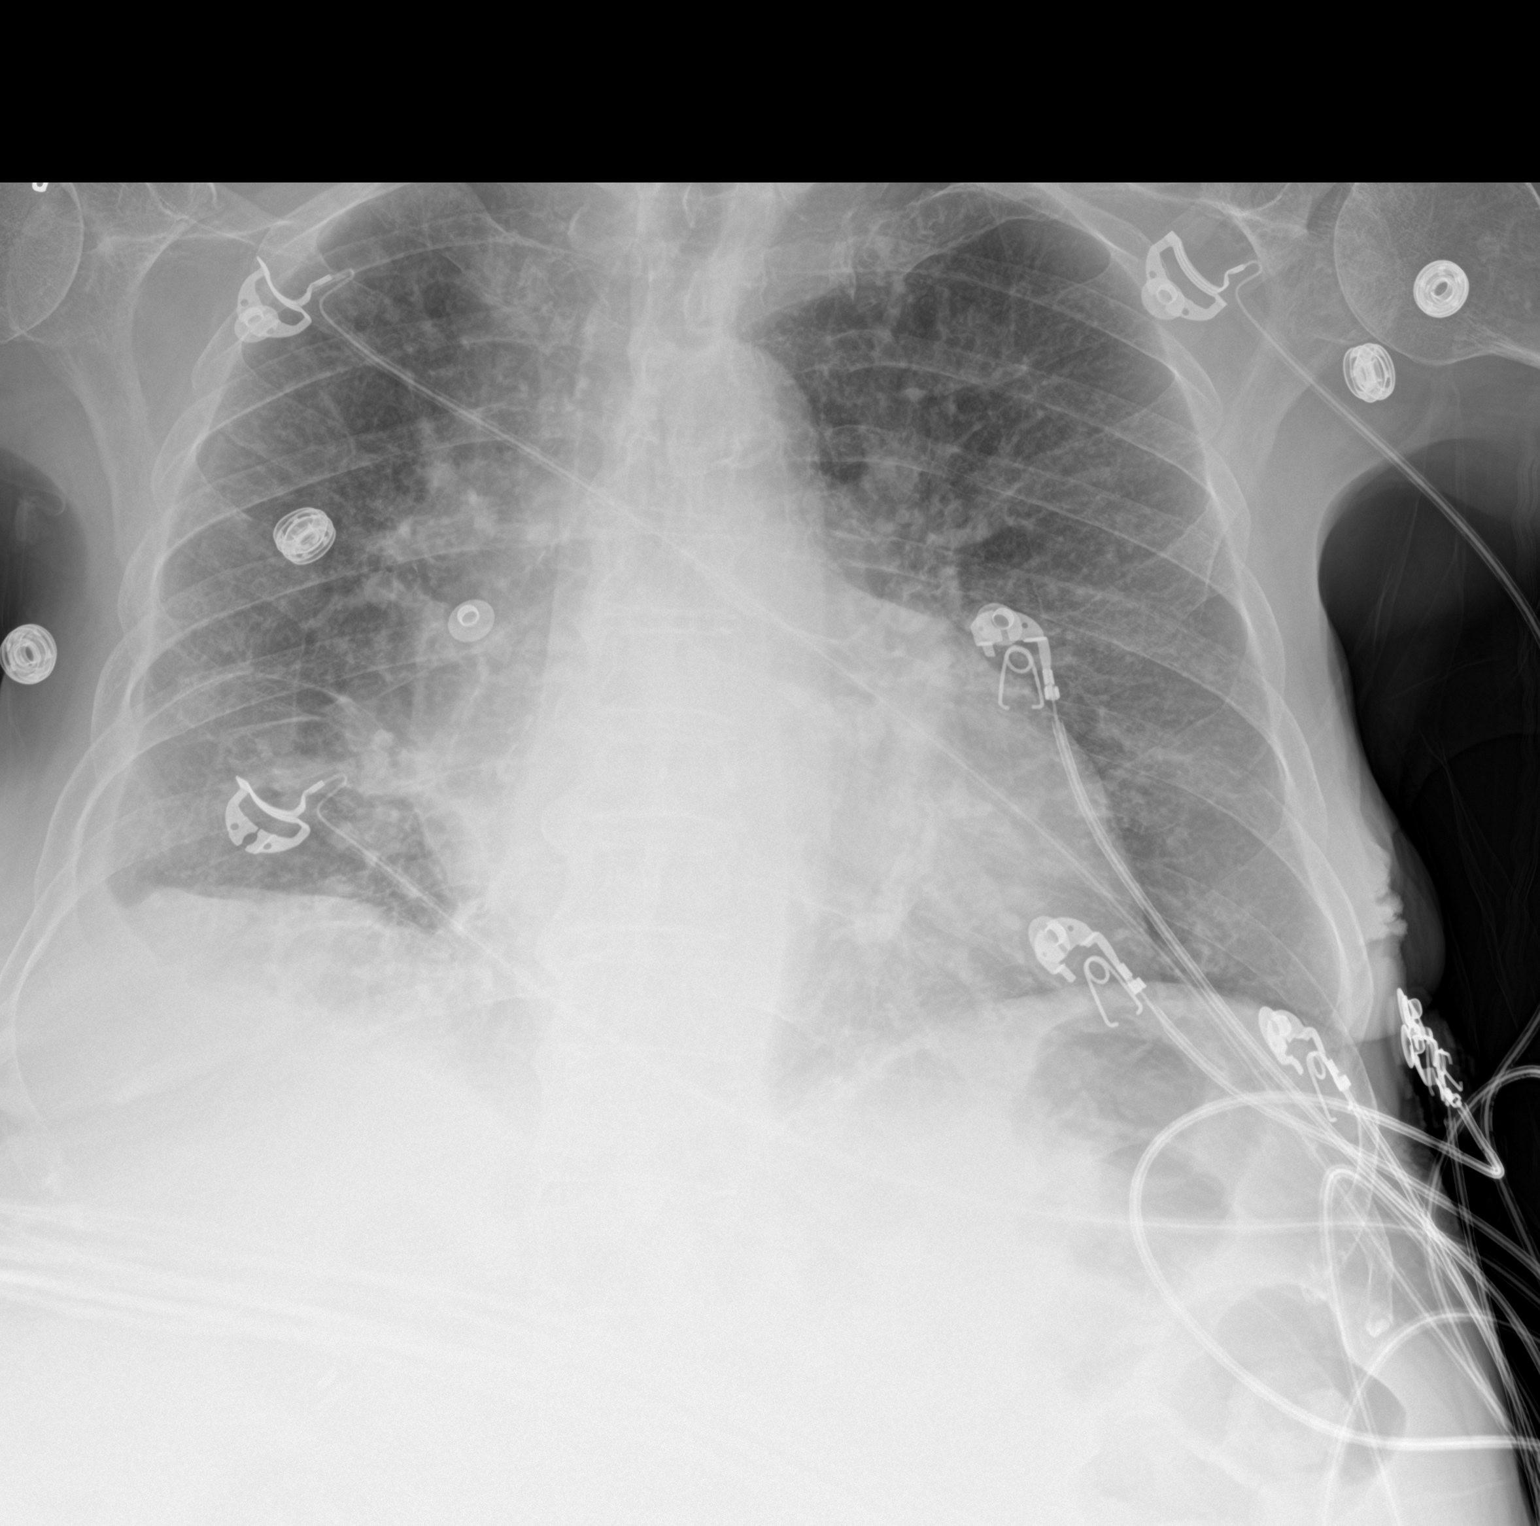

[2 of 2 positions shown; findings below may reference images not displayed]

FINDINGS: Cardiac shadow is mildly enlarged. Aortic calcifications are seen.
Mild central vascular congestion is noted without interstitial
edema. Small right-sided pleural effusion is noted laterally. No
focal infiltrate is seen. Degenerative changes of the thoracic spine
are noted.
IMPRESSION: Mild central vascular congestion with small right-sided pleural
effusion.
